# Patient Record
Sex: Female | Born: 1980 | Race: White | Hispanic: No | Marital: Married | Smoking: Former smoker
Health system: Southern US, Community
[De-identification: ages and names within clinical notes are randomized; demographics above are authoritative.]

## PROBLEM LIST (undated history)

## (undated) DIAGNOSIS — E119 Type 2 diabetes mellitus without complications: Secondary | ICD-10-CM

## (undated) DIAGNOSIS — F32A Depression, unspecified: Secondary | ICD-10-CM

## (undated) DIAGNOSIS — E785 Hyperlipidemia, unspecified: Secondary | ICD-10-CM

## (undated) DIAGNOSIS — F419 Anxiety disorder, unspecified: Secondary | ICD-10-CM

## (undated) DIAGNOSIS — F329 Major depressive disorder, single episode, unspecified: Secondary | ICD-10-CM

## (undated) DIAGNOSIS — D649 Anemia, unspecified: Secondary | ICD-10-CM

## (undated) HISTORY — DX: Anxiety disorder, unspecified: F41.9

## (undated) HISTORY — PX: INCISION AND DRAINAGE ABSCESS: SHX5864

## (undated) HISTORY — DX: Hyperlipidemia, unspecified: E78.5

## (undated) HISTORY — DX: Anemia, unspecified: D64.9

## (undated) HISTORY — DX: Major depressive disorder, single episode, unspecified: F32.9

## (undated) HISTORY — DX: Depression, unspecified: F32.A

---

## 2001-04-18 HISTORY — PX: TUBAL LIGATION: SHX77

## 2006-03-18 ENCOUNTER — Emergency Department: Payer: Self-pay | Admitting: Emergency Medicine

## 2007-05-06 DIAGNOSIS — E669 Obesity, unspecified: Secondary | ICD-10-CM | POA: Insufficient documentation

## 2007-05-06 DIAGNOSIS — Z6832 Body mass index (BMI) 32.0-32.9, adult: Secondary | ICD-10-CM | POA: Insufficient documentation

## 2007-05-06 DIAGNOSIS — F17200 Nicotine dependence, unspecified, uncomplicated: Secondary | ICD-10-CM | POA: Insufficient documentation

## 2007-05-06 DIAGNOSIS — N926 Irregular menstruation, unspecified: Secondary | ICD-10-CM | POA: Insufficient documentation

## 2007-06-11 ENCOUNTER — Ambulatory Visit: Payer: Self-pay | Admitting: Family Medicine

## 2007-07-23 DIAGNOSIS — F430 Acute stress reaction: Secondary | ICD-10-CM | POA: Insufficient documentation

## 2011-03-18 LAB — HM PAP SMEAR

## 2012-08-20 ENCOUNTER — Emergency Department: Payer: Self-pay | Admitting: Emergency Medicine

## 2013-02-13 LAB — LIPID PANEL
Cholesterol: 212 mg/dL — AB (ref 0–200)
HDL: 30 mg/dL — AB (ref 35–70)
LDL CALC: 150 mg/dL
TRIGLYCERIDES: 158 mg/dL (ref 40–160)

## 2013-02-13 LAB — HEPATIC FUNCTION PANEL
ALT: 14 U/L (ref 7–35)
AST: 9 U/L — AB (ref 13–35)

## 2013-02-13 LAB — BASIC METABOLIC PANEL
BUN: 6 mg/dL (ref 4–21)
Creatinine: 0.6 mg/dL (ref <=1.1)
GLUCOSE: 106 mg/dL
Potassium: 5.3 mmol/L (ref 3.4–5.3)
Sodium: 141 mmol/L (ref 137–147)

## 2013-09-13 ENCOUNTER — Ambulatory Visit: Payer: Self-pay | Admitting: Family Medicine

## 2014-08-29 LAB — HEMOGLOBIN A1C: Hgb A1c MFr Bld: 7.3 % — AB (ref 4.0–6.0)

## 2014-09-11 DIAGNOSIS — J309 Allergic rhinitis, unspecified: Secondary | ICD-10-CM | POA: Insufficient documentation

## 2014-09-11 DIAGNOSIS — F419 Anxiety disorder, unspecified: Secondary | ICD-10-CM | POA: Insufficient documentation

## 2014-09-11 DIAGNOSIS — D649 Anemia, unspecified: Secondary | ICD-10-CM | POA: Insufficient documentation

## 2014-09-11 DIAGNOSIS — L301 Dyshidrosis [pompholyx]: Secondary | ICD-10-CM | POA: Insufficient documentation

## 2014-09-11 DIAGNOSIS — E119 Type 2 diabetes mellitus without complications: Secondary | ICD-10-CM | POA: Insufficient documentation

## 2014-09-11 DIAGNOSIS — E7800 Pure hypercholesterolemia, unspecified: Secondary | ICD-10-CM | POA: Insufficient documentation

## 2014-09-11 DIAGNOSIS — F329 Major depressive disorder, single episode, unspecified: Secondary | ICD-10-CM | POA: Insufficient documentation

## 2014-09-11 DIAGNOSIS — A6000 Herpesviral infection of urogenital system, unspecified: Secondary | ICD-10-CM | POA: Insufficient documentation

## 2014-09-11 DIAGNOSIS — E1165 Type 2 diabetes mellitus with hyperglycemia: Secondary | ICD-10-CM | POA: Insufficient documentation

## 2014-09-11 DIAGNOSIS — O24419 Gestational diabetes mellitus in pregnancy, unspecified control: Secondary | ICD-10-CM | POA: Insufficient documentation

## 2014-09-11 DIAGNOSIS — F32A Depression, unspecified: Secondary | ICD-10-CM | POA: Insufficient documentation

## 2014-10-02 ENCOUNTER — Ambulatory Visit (INDEPENDENT_AMBULATORY_CARE_PROVIDER_SITE_OTHER): Payer: BLUE CROSS/BLUE SHIELD | Admitting: Family Medicine

## 2014-10-02 ENCOUNTER — Encounter: Payer: Self-pay | Admitting: Family Medicine

## 2014-10-02 VITALS — BP 110/76 | HR 80 | Temp 98.5°F | Resp 16 | Ht 63.0 in | Wt 172.0 lb

## 2014-10-02 DIAGNOSIS — F419 Anxiety disorder, unspecified: Secondary | ICD-10-CM

## 2014-10-02 DIAGNOSIS — E119 Type 2 diabetes mellitus without complications: Secondary | ICD-10-CM

## 2014-10-02 MED ORDER — ESCITALOPRAM OXALATE 10 MG PO TABS: 10.0000 mg | ORAL_TABLET | Freq: Every day | ORAL | Status: DC

## 2014-10-02 MED ORDER — METFORMIN HCL 1000 MG PO TABS: 1000.0000 mg | ORAL_TABLET | Freq: Two times a day (BID) | ORAL | Status: DC

## 2014-10-02 NOTE — Progress Notes (Signed)
Subjective:    Patient ID: Bethany Edwards, female    DOB: [birth year], 34 y.o.   MRN: [id number]  Diabetes She presents for her follow-up diabetic visit. She has type 2 diabetes mellitus. Her disease course has been stable. There are no hypoglycemic associated symptoms. Pertinent negatives for diabetes include no blurred vision, no chest pain, no fatigue, no foot paresthesias, no foot ulcerations, no polydipsia, no polyphagia, no polyuria, no visual change and no weakness. Symptoms are stable. There are no diabetic complications. Current diabetic treatment includes oral agent (monotherapy). She is compliant with treatment all of the time. Her home blood glucose trend is decreasing steadily. Her breakfast blood glucose is taken between 5-6 am. Her breakfast blood glucose range is generally <70 mg/dl.  Prior ov 08/29/14 Patient was restarted on Metformin HCl 1000 mg BID.  Depression: Patient complains of depression. She complains of depressed mood Onset was approximately a few months ago, gradually improving since that time.  She denies current suicidal and homicidal plan or intent.   Family history significant for no psychiatric illness.Possible organic causes contributing are: none.  Risk factors: none Previous treatment includes Cymbalta. She complains of the following side effects from the treatment: none. Prior ov 08/29/14 Patient was started on Escitalopram Oxalate 10 mg.    Review of Systems  Constitutional: Negative.  Negative for fatigue.  Eyes: Negative for blurred vision.  Respiratory: Negative.   Cardiovascular: Negative.  Negative for chest pain.  Endocrine: Negative for polydipsia, polyphagia and polyuria.  Neurological: Negative for weakness.  Psychiatric/Behavioral: Negative.     Patient Active Problem List   Diagnosis Date Noted  . Allergic rhinitis 09/11/2014  . Absolute anemia 09/11/2014  . Anxiety 09/11/2014  . Clinical depression 09/11/2014  . Diabetes 09/11/2014  .  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 Genital herpes 09/11/2014  . Diabetes mellitus arising in pregnancy 09/11/2014  . Hypercholesteremia 09/11/2014  . Acute stress disorder 07/23/2007  . Body mass index 32.0-32.9, adult 05/06/2007  . Irregular bleeding 05/06/2007  . Adiposity 05/06/2007  . Compulsive tobacco user syndrome 05/06/2007   Past Medical History  Diagnosis Date  . Depression   . Hyperlipidemia    Current Outpatient Prescriptions on File Prior to Visit  Medication Sig  . escitalopram (LEXAPRO) 10 MG tablet Take by mouth.  . metFORMIN (GLUCOPHAGE) 1000 MG tablet Take by mouth.  . pravastatin (PRAVACHOL) 20 MG tablet Take by mouth.   No current facility-administered medications on file prior to visit.   Allergies  Allergen Reactions  . Penicillins Rash    Can take Cephalosporin   Past Surgical History  Procedure Laterality Date  . Tubal ligation  2003  . Incision and drainage abscess Left 2003 and 2007    Axillary Abscess   History   Social History  . Marital Status: Married    Spouse Name: N/A  . Number of Children: N/A  . Years of Education: N/A   Occupational History  . Not on file.   Social History Main Topics  . Smoking status: Current Every Day Smoker -- 0.50 packs/day  . Smokeless tobacco: Never Used  . Alcohol Use: No  . Drug Use: No  . Sexual Activity: Not on file   Other Topics Concern  . Not on file   Social History Narrative   Family History  Problem Relation Age of Onset  . Adopted: Yes  . Family history unknown: Yes         Objective:   Physical Exam  Constitutional:  She appears well-developed and well-nourished.  Skin: Skin is warm and dry.  Psychiatric: She has a normal mood and affect. Her behavior is normal. Judgment and thought content normal.    Blood pressure 110/76, pulse 80, temperature 98.5 F (36.9 C), temperature source Oral, resp. rate 16, height 5\' 3"  (1.6 m), weight 172 lb (78.019 kg), last menstrual period  09/11/2014.       Assessment & Plan:    1. Type 2 diabetes mellitus without complication Improved.  Continue current medication and recheck in 3 months.  Is causing loose stools but tolerable. Has history of constipation.   2. Anxiety Greatly improved on medication. Mood level. No signs of bipolar disorder. Continue current medication. Please call back if condition worsens or does not continue to improve.

## 2014-11-13 ENCOUNTER — Encounter: Payer: Self-pay | Admitting: Family Medicine

## 2014-11-13 ENCOUNTER — Ambulatory Visit (INDEPENDENT_AMBULATORY_CARE_PROVIDER_SITE_OTHER): Payer: BLUE CROSS/BLUE SHIELD | Admitting: Family Medicine

## 2014-11-13 VITALS — BP 100/62 | HR 92 | Temp 98.4°F | Resp 16 | Wt 173.0 lb

## 2014-11-13 DIAGNOSIS — L0291 Cutaneous abscess, unspecified: Secondary | ICD-10-CM | POA: Insufficient documentation

## 2014-11-13 MED ORDER — AMOXICILLIN-POT CLAVULANATE 875-125 MG PO TABS: 1.0000 | ORAL_TABLET | Freq: Two times a day (BID) | ORAL | Status: DC

## 2014-11-13 NOTE — Progress Notes (Signed)
Subjective:    Patient ID: Bethany Edwards, female    DOB: Nov 01, 1980, 34 y.o.   MRN: [id number]  Rash This is a new problem. The current episode started in the past 7 days. The problem has been gradually worsening since onset. The affected locations include the right upper leg. The rash is characterized by pain, burning, swelling, itchiness and redness (warmth). She was exposed to an insect bite/sting. Associated symptoms include diarrhea (unrelated- pt would like to reduce Metformin), fatigue and a fever (up to 102.8). Pertinent negatives include no anorexia, congestion, cough, eye pain, facial edema, joint pain, nail changes, rhinorrhea, shortness of breath, sore throat or vomiting. Treatments tried: Cortisone cream and an old Doxy rx. The treatment provided no relief.      Review of Systems  Constitutional: Positive for fever (up to 102.8), chills and fatigue. Negative for diaphoresis, activity change, appetite change and unexpected weight change.  HENT: Negative for congestion, rhinorrhea and sore throat.   Eyes: Negative for pain.  Respiratory: Negative for cough and shortness of breath.   Gastrointestinal: Positive for diarrhea (unrelated- pt would like to reduce Metformin). Negative for vomiting and anorexia.  Musculoskeletal: Negative for joint pain.  Skin: Positive for rash. Negative for nail changes.   BP 100/62 mmHg  Pulse 92  Temp(Src) 98.4 F (36.9 C) (Oral)  Resp 16  Wt 173 lb (78.472 kg)  LMP 10/28/2014   Patient Active Problem List   Diagnosis Date Noted  . Allergic rhinitis 09/11/2014  . Absolute anemia 09/11/2014  . Anxiety 09/11/2014  . Diabetes 09/11/2014  . 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 Genital herpes 09/11/2014  . Hypercholesteremia 09/11/2014  . Body mass index 32.0-32.9, adult 05/06/2007  . Irregular bleeding 05/06/2007  . Adiposity 05/06/2007  . Compulsive tobacco user syndrome 05/06/2007   Past Medical History  Diagnosis Date  . Depression    . Hyperlipidemia    Current Outpatient Prescriptions on File Prior to Visit  Medication Sig  . escitalopram (LEXAPRO) 10 MG tablet Take 1 tablet (10 mg total) by mouth daily.  . metFORMIN (GLUCOPHAGE) 1000 MG tablet Take 1 tablet (1,000 mg total) by mouth 2 (two) times daily with a meal.  . pravastatin (PRAVACHOL) 20 MG tablet Take by mouth.   No current facility-administered medications on file prior to visit.   Allergies  Allergen Reactions  . Penicillins Rash    Can take Cephalosporin   Past Surgical History  Procedure Laterality Date  . Tubal ligation  2003  . Incision and drainage abscess Left 2003 and 2007    Axillary Abscess   History   Social History  . Marital Status: Married    Spouse Name: N/A  . Number of Children: N/A  . Years of Education: N/A   Occupational History  . Not on file.   Social History Main Topics  . Smoking status: Current Every Day Smoker -- 0.50 packs/day  . Smokeless tobacco: Never Used  . Alcohol Use: No  . Drug Use: No  . Sexual Activity: Not on file   Other Topics Concern  . Not on file   Social History Narrative   Family History  Problem Relation Age of Onset  . Adopted: Yes  . Family history unknown: Yes      Objective:   Physical Exam  Constitutional: She is oriented to person, place, and time. She appears well-developed and well-nourished.  Neurological: She is alert and oriented to person, place, and time.  Skin:  Palpable 2 cm  nodule with central scab and erythema.  Also, with palpable tender inguinal nodes.    Psychiatric: She has a normal mood and affect.   BP 100/62 mmHg  Pulse 92  Temp(Src) 98.4 F (36.9 C) (Oral)  Resp 16  Wt 173 lb (78.472 kg)  LMP 10/28/2014      Assessment & Plan:  1. Abscess Did not respond to Doxycycline.  Will start Augmentin. Call if worsens or does not improve.    - amoxicillin-clavulanate (AUGMENTIN) 875-125 MG per tablet; Take 1 tablet by mouth 2 (two) times daily.   Dispense: 20 tablet; Refill: 0  Lorie Phenix, MD

## 2014-11-19 ENCOUNTER — Telehealth: Payer: Self-pay | Admitting: Family Medicine

## 2014-11-19 NOTE — Telephone Encounter (Signed)
Pt states she was seen last Thursday for a bug bite.  Pt states she is still not feeling well and is still has a low grade fever.  Pt states she feels tired and dizzy.  Pt is asking is she needs lab work done or does she need to be seen again.  WG#[phone]/HQ

## 2014-11-19 NOTE — Telephone Encounter (Signed)
Can change to Doxycycline. Thanks.

## 2015-01-02 ENCOUNTER — Ambulatory Visit: Payer: BLUE CROSS/BLUE SHIELD | Admitting: Family Medicine

## 2015-01-05 ENCOUNTER — Ambulatory Visit (INDEPENDENT_AMBULATORY_CARE_PROVIDER_SITE_OTHER): Payer: BLUE CROSS/BLUE SHIELD | Admitting: Family Medicine

## 2015-01-05 ENCOUNTER — Encounter: Payer: Self-pay | Admitting: Family Medicine

## 2015-01-05 VITALS — BP 104/76 | HR 88 | Temp 98.5°F | Resp 16 | Ht 63.0 in | Wt 176.0 lb

## 2015-01-05 DIAGNOSIS — F419 Anxiety disorder, unspecified: Secondary | ICD-10-CM | POA: Diagnosis not present

## 2015-01-05 DIAGNOSIS — E7800 Pure hypercholesterolemia, unspecified: Secondary | ICD-10-CM

## 2015-01-05 DIAGNOSIS — E119 Type 2 diabetes mellitus without complications: Secondary | ICD-10-CM | POA: Diagnosis not present

## 2015-01-05 DIAGNOSIS — Z6831 Body mass index (BMI) 31.0-31.9, adult: Secondary | ICD-10-CM | POA: Diagnosis not present

## 2015-01-05 LAB — POCT GLYCOSYLATED HEMOGLOBIN (HGB A1C): HEMOGLOBIN A1C: 7

## 2015-01-05 MED ORDER — PHENTERMINE HCL 37.5 MG PO CAPS: 37.5000 mg | ORAL_CAPSULE | ORAL | Status: DC

## 2015-01-05 MED ORDER — METFORMIN HCL ER (OSM) 1000 MG PO TB24: 1000.0000 mg | ORAL_TABLET | Freq: Every day | ORAL | Status: DC

## 2015-01-05 NOTE — Progress Notes (Signed)
Patient ID: Bethany Edwards, female   DOB: [birth year], 34 y.o.   MRN: [id number]         Patient: Bethany Edwards Female    DOB: [birth year]   34 y.o.   MRN: [id number] Visit Date: 01/05/2015  Today's Provider: Lorie Phenix, MD   Chief Complaint  Patient presents with  . Diabetes  . Anxiety  . Hyperlipidemia   Subjective:    Diabetes She presents for her follow-up diabetic visit. She has type 2 diabetes mellitus. Her disease course has been stable. Hypoglycemia symptoms include nervousness/anxiousness. Pertinent negatives for hypoglycemia include no confusion, dizziness, headaches or tremors. Associated symptoms include polydipsia and polyuria. Pertinent negatives for diabetes include no blurred vision, no chest pain, no foot paresthesias, no foot ulcerations, no polyphagia, no visual change, no weakness and no weight loss. Symptoms are stable. Pertinent negatives for diabetic complications include no impotence. Current diabetic treatment includes oral agent (monotherapy). She is compliant with treatment all of the time. Her weight is stable. She is following a diabetic diet. There is no change in her home blood glucose trend. Her overall blood glucose range is 130-140 mg/dl. She does not see a podiatrist.Eye exam is not current.  Anxiety Presents for follow-up visit. Symptoms include nervous/anxious behavior. Patient reports no chest pain, confusion, decreased concentration, dizziness, impotence, insomnia, malaise, muscle tension, nausea, obsessions, palpitations, panic, restlessness or suicidal ideas. The quality of sleep is good. Nighttime awakenings: none.   Past treatments include SSRIs. The treatment provided moderate relief. Compliance with prior treatments has been good.    Pt reports a having a skin tag on the left side of her head (Near her hairline).  She says it has been there for about six months and is growing in size.  She says it gets caught in her hair brush, and  sunglasses.    Pt would like to try Phentermine for a short time.  She would like to try and "Just start" her diet.  She has used it in the past with success. Planning to get married next year. Has been on it before and it helped.  Knows it is just short term. Starting classes and working and really busy. Does not eat poorly, but needs a jolt. Is how it works.     Allergies  Allergen Reactions  . Penicillins Rash    Can take Cephalosporin   Previous Medications   ESCITALOPRAM (LEXAPRO) 10 MG TABLET    Take 1 tablet (10 mg total) by mouth daily.   PRAVASTATIN (PRAVACHOL) 20 MG TABLET    Take by mouth.    Review of Systems  Constitutional: Negative for chills, weight loss, diaphoresis, activity change, appetite change and unexpected weight change.  Eyes: Negative for blurred vision.  Respiratory: Positive for wheezing. Negative for apnea, choking, chest tightness and stridor.        Pt currently smokes.   Cardiovascular: Negative for chest pain and palpitations.  Gastrointestinal: Negative for nausea, abdominal pain, constipation, blood in stool, abdominal distention, anal bleeding and rectal pain.  Endocrine: Positive for polydipsia and polyuria. Negative for cold intolerance, heat intolerance and polyphagia.  Genitourinary: Negative for impotence.  Musculoskeletal: Negative.   Skin:       Skin tag on left side of her face.    Neurological: Negative for dizziness, tremors, weakness, light-headedness, numbness and headaches.  Psychiatric/Behavioral: Negative for suicidal ideas, hallucinations, behavioral problems, confusion, sleep disturbance, self-injury, dysphoric mood, decreased concentration and agitation. The patient is nervous/anxious. The patient  does not have insomnia and is not hyperactive.     Social History  Substance Use Topics  . Smoking status: Current Every Day Smoker -- 0.50 packs/day  . Smokeless tobacco: Never Used  . Alcohol Use: No   Objective:   BP 104/76  mmHg  Pulse 88  Temp(Src) 98.5 F (36.9 C) (Oral)  Resp 16  Ht  (1.6 m)  Wt 176 lb (79.833 kg)  BMI 31.18 kg/m2  Physical Exam  Constitutional: She is oriented to person, place, and time. She appears well-developed and well-nourished.  Cardiovascular: Normal rate and regular rhythm.   Pulmonary/Chest: Effort normal and breath sounds normal.  Neurological: She is alert and oriented to person, place, and time.  Skin: Skin is warm and dry.  Skin tag on temple.          Assessment & Plan:     1. Type 2 diabetes mellitus without complication Improved, but having trouble with Metformin. Will try extended release and see if improves. Also want to add some Phentermine. Has tolerated it well in the past. EKG normal BP ok.  - POCT glycosylated hemoglobin (Hb A1C) - EKG 12-Lead  2. Hypercholesteremia Condition is stable. Please continue current medication and  plan of care as noted.    3. Anxiety Condition is stable. Please continue current medication and  plan of care as noted.    4. BMI 31.0-31.9,adult Worsening. Would like to restart Phentermine. Understands risks of increased blood pressure and anxiety. Will stop if any side effects. Has tolerated previously.   - phentermine 37.5 MG capsule; Take 1 capsule (37.5 mg total) by mouth every morning.  Dispense: 30 capsule; Refill: 0      Lorie Phenix, MD  [facility]

## 2015-01-19 ENCOUNTER — Telehealth: Payer: Self-pay | Admitting: Family Medicine

## 2015-01-19 DIAGNOSIS — E119 Type 2 diabetes mellitus without complications: Secondary | ICD-10-CM

## 2015-01-19 MED ORDER — METFORMIN HCL 1000 MG PO TABS: 1000.0000 mg | ORAL_TABLET | Freq: Two times a day (BID) | ORAL | Status: DC

## 2015-01-19 NOTE — Telephone Encounter (Signed)
Pt called wanting to know if you would change her metformin back to the regular.  She said the extended was not keeping her sugar down.  Call back [phone]  [facility]

## 2015-01-20 ENCOUNTER — Telehealth: Payer: Self-pay | Admitting: Family Medicine

## 2015-01-20 MED ORDER — GLIPIZIDE ER 2.5 MG PO TB24: 2.5000 mg | ORAL_TABLET | Freq: Every day | ORAL | Status: DC

## 2015-01-20 MED ORDER — METFORMIN HCL 500 MG PO TABS: 500.0000 mg | ORAL_TABLET | Freq: Two times a day (BID) | ORAL | Status: DC

## 2015-01-20 NOTE — Telephone Encounter (Signed)
Pt advised.   Thanks,   -Gabrielle Wakeland  

## 2015-01-20 NOTE — Telephone Encounter (Signed)
Stomach not doing well with the metformin ER.  Please advise.  [phone]  thanksTer

## 2015-01-20 NOTE — Telephone Encounter (Signed)
Sent Metformin at 500 bid with glipizide low dose, 2.5. Please notify patient. Thanks.

## 2015-01-20 NOTE — Telephone Encounter (Signed)
Pt says she has bad side effects when she takes the regular release Metformin 1,000mg  twice a day.  She reports she tolerated the  twice a day okay.  She was wondering if you could drop it back to  twice a day and maybe add another medication to help lower he blood sugar.  [facility] [address]  Thanks,   -Vernona Rieger

## 2015-02-03 ENCOUNTER — Ambulatory Visit: Payer: BLUE CROSS/BLUE SHIELD | Admitting: Family Medicine

## 2015-02-10 ENCOUNTER — Ambulatory Visit (INDEPENDENT_AMBULATORY_CARE_PROVIDER_SITE_OTHER): Payer: BLUE CROSS/BLUE SHIELD | Admitting: Family Medicine

## 2015-02-10 ENCOUNTER — Encounter: Payer: Self-pay | Admitting: Family Medicine

## 2015-02-10 VITALS — BP 100/64 | HR 96 | Temp 98.4°F | Resp 16 | Wt 176.0 lb

## 2015-02-10 DIAGNOSIS — L918 Other hypertrophic disorders of the skin: Secondary | ICD-10-CM

## 2015-02-10 DIAGNOSIS — E669 Obesity, unspecified: Secondary | ICD-10-CM

## 2015-02-10 DIAGNOSIS — E119 Type 2 diabetes mellitus without complications: Secondary | ICD-10-CM

## 2015-02-10 NOTE — Progress Notes (Signed)
Subjective:    Patient ID: Bethany Edwards, female    DOB: 1980/10/15, 34 y.o.   MRN: [id number]  Diabetes She presents for her follow-up diabetic visit. She has type 2 diabetes mellitus. Her disease course has been improving (Last A1C was 01/05/2015 and was 7.0%). Hypoglycemia symptoms include hunger. Pertinent negatives for diabetes include no blurred vision, no chest pain, no fatigue, no foot paresthesias, no foot ulcerations, no polydipsia, no polyphagia, no polyuria, no visual change, no weakness and no weight loss. There are no hypoglycemic complications. Symptoms are improving. There are no diabetic complications. Current diabetic treatment includes oral agent (dual therapy) (Metformin 500 mg BID and Glipizide 2.5 mg). She is compliant with treatment all of the time. Her weight is stable. She never participates in exercise. Her home blood glucose trend is decreasing steadily. Her breakfast blood glucose range is generally 90-110 mg/dl. An ACE inhibitor/angiotensin II receptor blocker is not being taken. Eye exam is not current.   SUBJECTIVE:  Bethany Edwards is a 34 y.o. female who would like a skin check. There is no personal history of skin cancer. There is an unknown family history of skin cancer due to adoption. Has several skin tags that get in the way. Several large lesions on her right neck line and one on her left temple.  Review of Systems  Constitutional: Negative for weight loss and fatigue.  Eyes: Negative for blurred vision.  Cardiovascular: Negative for chest pain.  Endocrine: Negative for polydipsia, polyphagia and polyuria.  Neurological: Negative for weakness.   BP 100/64 mmHg  Pulse 96  Temp(Src) 98.4 F (36.9 C) (Oral)  Resp 16  Wt 176 lb (79.833 kg)  LMP 01/24/2015   Patient Active Problem List   Diagnosis Date Noted  . Abscess 11/13/2014  . Allergic rhinitis 09/11/2014  . Absolute anemia 09/11/2014  . Anxiety 09/11/2014  . Diabetes (HCC) 09/11/2014    . 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 Genital herpes 09/11/2014  . Hypercholesteremia 09/11/2014  . Irregular bleeding 05/06/2007  . Adiposity 05/06/2007  . Compulsive tobacco user syndrome 05/06/2007   Past Medical History  Diagnosis Date  . Depression   . Hyperlipidemia    Current Outpatient Prescriptions on File Prior to Visit  Medication Sig  . escitalopram (LEXAPRO) 10 MG tablet Take 1 tablet (10 mg total) by mouth daily.  Marland Kitchen glipiZIDE (GLUCOTROL XL) 2.5 MG 24 hr tablet Take 1 tablet (2.5 mg total) by mouth daily with breakfast.  . phentermine 37.5 MG capsule Take 1 capsule (37.5 mg total) by mouth every morning. (Patient not taking: Reported on 02/10/2015)  . pravastatin (PRAVACHOL) 20 MG tablet Take by mouth.   No current facility-administered medications on file prior to visit.   Allergies  Allergen Reactions  . Penicillins Rash    Can take Cephalosporin   Past Surgical History  Procedure Laterality Date  . Tubal ligation  2003  . Incision and drainage abscess Left 2003 and 2007    Axillary Abscess   Social History   Social History  . Marital Status: Married    Spouse Name: N/A  . Number of Children: N/A  . Years of Education: N/A   Occupational History  . Not on file.   Social History Main Topics  . Smoking status: Current Every Day Smoker -- 0.50 packs/day  . Smokeless tobacco: Never Used  . Alcohol Use: No  . Drug Use: No  . Sexual Activity: Not on file   Other Topics Concern  . Not  on file   Social History Narrative   Family History  Problem Relation Age of Onset  . Adopted: Yes  . Family history unknown: Yes      Objective:   Physical Exam  Constitutional: She is oriented to person, place, and time. She appears well-developed and well-nourished.  Cardiovascular: Normal rate and regular rhythm.   Neurological: She is alert and oriented to person, place, and time.  Skin: Skin is warm and dry.  Skin tags on right neck, pedunculated,  hyperpigmented over pedicle. No abnormal skin changes. No erythema.  No irregular border.  Also, lesion on left temple, skin colored, smaller.  All treated with cryopen. Patient tolerated procedure well.     BP 100/64 mmHg  Pulse 96  Temp(Src) 98.4 F (36.9 C) (Oral)  Resp 16  Wt 176 lb (79.833 kg)  LMP 01/24/2015     Assessment & Plan:  1. Cutaneous skin tags Treated with cryopen today. Will retreat if not resolved.   2. Adiposity Going to take Phentermine.  She has not  Filled it yet.  Will monitor BP and call if any side effects.   3. Type 2 diabetes mellitus without complication, unspecified long term insulin use status (HCC) Improved on Metformin. Will continue medication.   Recheck in 2 months.   Lorie PhenixNancy Mechele Kittleson, MD

## 2015-03-11 ENCOUNTER — Ambulatory Visit: Payer: BLUE CROSS/BLUE SHIELD | Admitting: Family Medicine

## 2015-06-19 ENCOUNTER — Telehealth: Payer: Self-pay | Admitting: Family Medicine

## 2015-06-19 MED ORDER — GLIPIZIDE ER 2.5 MG PO TB24: 2.5000 mg | ORAL_TABLET | Freq: Every day | ORAL | Status: DC

## 2015-06-19 NOTE — Telephone Encounter (Signed)
[facility] not [facility] this medication-national back order for them, they are not sure when they will get it at this dose. I called [facility] and they do have this medication in stock, if patient ok with us sending it there or she can call other pharmacy to see if its cheaper somewhere else. [id number]

## 2015-06-19 NOTE — Telephone Encounter (Signed)
[facility] to let Dr. Elease HashimotoMaloney know that she took her last dose of glipiZIDE (GLUCOTROL XL) 2.5 MG 24 hr tablet  Last Tuesday or Wednesday 2/21 or 06/10/15 b/c all [facility] been out of that dosage. [facility] Dr[facility] Elease HashimotoMaloney wanted them to substitute for anything else. Please advise. Thanks TNP

## 2015-06-19 NOTE — Telephone Encounter (Signed)
Please call pharmacy and check on this. Thanks.

## 2015-06-19 NOTE — Telephone Encounter (Signed)
Spoke with patient and she is fine to go to [facility] and she will get this filled there this time.-aa

## 2015-07-01 ENCOUNTER — Other Ambulatory Visit: Payer: Self-pay | Admitting: Family Medicine

## 2015-07-01 DIAGNOSIS — E119 Type 2 diabetes mellitus without complications: Secondary | ICD-10-CM

## 2015-07-01 NOTE — Telephone Encounter (Signed)
Last A1C 01/05/2015. Last OV was cancelled and not rescheduled. Needs OV. Allene DillonEmily Drozdowski, CMA

## 2015-08-03 ENCOUNTER — Other Ambulatory Visit: Payer: Self-pay | Admitting: Family Medicine

## 2015-08-03 DIAGNOSIS — E119 Type 2 diabetes mellitus without complications: Secondary | ICD-10-CM

## 2015-09-07 ENCOUNTER — Encounter: Payer: Self-pay | Admitting: Family Medicine

## 2015-09-07 ENCOUNTER — Ambulatory Visit (INDEPENDENT_AMBULATORY_CARE_PROVIDER_SITE_OTHER): Payer: BLUE CROSS/BLUE SHIELD | Admitting: Family Medicine

## 2015-09-07 ENCOUNTER — Ambulatory Visit: Payer: BLUE CROSS/BLUE SHIELD | Admitting: Family Medicine

## 2015-09-07 VITALS — BP 100/60 | HR 110 | Temp 98.9°F | Resp 16 | Wt 179.0 lb

## 2015-09-07 DIAGNOSIS — R05 Cough: Secondary | ICD-10-CM

## 2015-09-07 DIAGNOSIS — F17200 Nicotine dependence, unspecified, uncomplicated: Secondary | ICD-10-CM | POA: Diagnosis not present

## 2015-09-07 DIAGNOSIS — R059 Cough, unspecified: Secondary | ICD-10-CM

## 2015-09-07 DIAGNOSIS — J018 Other acute sinusitis: Secondary | ICD-10-CM

## 2015-09-07 MED ORDER — ALBUTEROL SULFATE HFA 108 (90 BASE) MCG/ACT IN AERS: 2.0000 | INHALATION_SPRAY | Freq: Four times a day (QID) | RESPIRATORY_TRACT | Status: DC | PRN

## 2015-09-07 MED ORDER — BUPROPION HCL ER (SR) 150 MG PO TB12: 100.0000 mg | ORAL_TABLET | Freq: Two times a day (BID) | ORAL | Status: DC

## 2015-09-07 MED ORDER — AMOXICILLIN-POT CLAVULANATE 875-125 MG PO TABS: 1.0000 | ORAL_TABLET | Freq: Two times a day (BID) | ORAL | Status: DC

## 2015-09-07 NOTE — Progress Notes (Signed)
Patient ID: Bethany Edwards, female   DOB: 09/05/1980, 35 y.o.   MRN: [id number]       Patient: Bethany Edwards Female    DOB: 08/25/1980   35 y.o.   MRN: [id number] Visit Date: 09/07/2015  Today's Provider: Lorie PhenixNancy Mikaili Flippin, MD   Chief Complaint  Patient presents with  . URI   Subjective:    URI  This is a new problem. Episode onset: saturday. The problem has been gradually worsening. The maximum temperature recorded prior to her arrival was 102 - 102.9 F. The fever has been present for 1 to 2 days. Associated symptoms include congestion, coughing, headaches, a plugged ear sensation, rhinorrhea, sinus pain, sneezing, a sore throat and wheezing. She has tried acetaminophen and decongestant for the symptoms. The treatment provided mild relief.   Still smoking.      Allergies  Allergen Reactions  . Penicillins Rash    Can take Cephalosporin   Previous Medications   GLIPIZIDE (GLUCOTROL XL) 2.5 MG 24 HR TABLET    Take 1 tablet (2.5 mg total) by mouth daily with breakfast.   METFORMIN (GLUCOPHAGE) 500 MG TABLET    TAKE 1 TABLET (500 MG TOTAL) BY MOUTH 2 (TWO) TIMES DAILY WITH A MEAL.    Review of Systems  HENT: Positive for congestion, rhinorrhea, sneezing and sore throat.   Respiratory: Positive for cough and wheezing.   Neurological: Positive for headaches.    Social History  Substance Use Topics  . Smoking status: Current Every Day Smoker -- 0.50 packs/day  . Smokeless tobacco: Never Used  . Alcohol Use: No   Objective:   BP 100/60 mmHg  Pulse 110  Temp(Src) 98.9 F (37.2 C) (Oral)  Resp 16  Wt 179 lb (81.194 kg)  SpO2 95%  Physical Exam  Constitutional: She is oriented to person, place, and time. She appears well-developed and well-nourished.  HENT:  Head: Normocephalic and atraumatic.  Right Ear: External ear normal.  Left Ear: External ear normal.  Nose: Mucosal edema and rhinorrhea present.  Mouth/Throat: Oropharynx is clear and moist.  Cardiovascular:  Normal rate and regular rhythm.   Pulmonary/Chest: She has wheezes.  scattered inspiratory wheezing  Neurological: She is alert and oriented to person, place, and time.  Psychiatric: She has a normal mood and affect. Her behavior is normal. Judgment and thought content normal.       Assessment & Plan:     1. Cough New problem. Patient started on augmentin 875-125 mg and albuterol inhaler as below. - amoxicillin-clavulanate (AUGMENTIN) 875-125 MG tablet; Take 1 tablet by mouth 2 (two) times daily.  Dispense: 20 tablet; Refill: 0 - albuterol (PROVENTIL HFA;VENTOLIN HFA) 108 (90 Base) MCG/ACT inhaler; Inhale 2 puffs into the lungs every 6 (six) hours as needed for wheezing or shortness of breath.  Dispense: 1 Inhaler; Refill: 0  2. Other acute sinusitis New problem. As above. - albuterol (PROVENTIL HFA;VENTOLIN HFA) 108 (90 Base) MCG/ACT inhaler; Inhale 2 puffs into the lungs every 6 (six) hours as needed for wheezing or shortness of breath.  Dispense: 1 Inhaler; Refill: 0  3. Compulsive tobacco user syndrome Patient started on bupropion SR 150 mg 1 tablet for 3 days then 1 tablet two times daily. - buPROPion (WELLBUTRIN SR) 150 MG 12 hr tablet; Take 1 tablet (150 mg total) by mouth 2 (two) times daily.  Dispense: 30 tablet; Refill: 3    Patient seen and examined by Dr. Leo GrosserNancy J.. Francia Verry, and note scribed by Liz BeachSulibeya S. Dimas,  CMA.  I have reviewed the document for accuracy and completeness and I agree with above. - Leo Grosser, MD   Lorie Phenix, MD  [facility]

## 2015-09-09 ENCOUNTER — Ambulatory Visit
Admission: RE | Admit: 2015-09-09 | Discharge: 2015-09-09 | Disposition: A | Discharge status: Home / Self Care | Payer: BLUE CROSS/BLUE SHIELD | Source: Ambulatory Visit | Attending: Family Medicine | Admitting: Family Medicine

## 2015-09-09 ENCOUNTER — Encounter: Payer: Self-pay | Admitting: Family Medicine

## 2015-09-09 ENCOUNTER — Ambulatory Visit (INDEPENDENT_AMBULATORY_CARE_PROVIDER_SITE_OTHER): Payer: BLUE CROSS/BLUE SHIELD | Admitting: Family Medicine

## 2015-09-09 VITALS — BP 102/74 | HR 92 | Temp 98.8°F | Resp 16 | Wt 178.0 lb

## 2015-09-09 DIAGNOSIS — J209 Acute bronchitis, unspecified: Secondary | ICD-10-CM

## 2015-09-09 DIAGNOSIS — L0291 Cutaneous abscess, unspecified: Secondary | ICD-10-CM

## 2015-09-09 DIAGNOSIS — R0602 Shortness of breath: Secondary | ICD-10-CM | POA: Diagnosis not present

## 2015-09-09 DIAGNOSIS — R05 Cough: Secondary | ICD-10-CM | POA: Diagnosis not present

## 2015-09-09 DIAGNOSIS — E119 Type 2 diabetes mellitus without complications: Secondary | ICD-10-CM

## 2015-09-09 DIAGNOSIS — E7800 Pure hypercholesterolemia, unspecified: Secondary | ICD-10-CM

## 2015-09-09 LAB — POCT GLYCOSYLATED HEMOGLOBIN (HGB A1C): Hemoglobin A1C: 7.7

## 2015-09-09 MED ORDER — GLIPIZIDE ER 5 MG PO TB24: 5.0000 mg | ORAL_TABLET | Freq: Every day | ORAL | Status: DC

## 2015-09-09 MED ORDER — DOXYCYCLINE HYCLATE 100 MG PO TABS: 100.0000 mg | ORAL_TABLET | Freq: Two times a day (BID) | ORAL | Status: DC

## 2015-09-09 MED ORDER — PREDNISONE 10 MG PO TABS: ORAL_TABLET | ORAL | Status: DC

## 2015-09-09 NOTE — Progress Notes (Signed)
Patient ID: Bethany Edwards, female   DOB: 05/24/1980, 35 y.o.   MRN: [id number]         Patient: Bethany Edwards Female    DOB: [birth year]   35 y.o.   MRN: [id number] Visit Date: 09/09/2015  Today's Provider: Lorie Phenix, MD   Chief Complaint  Patient presents with  . Diabetes   Subjective:    HPI   Diabetes Mellitus Type II, Follow-up:   Lab Results  Component Value Date   HGBA1C 7.7 09/09/2015   HGBA1C 7.0 01/05/2015   HGBA1C 7.3* 08/29/2014   Last seen for diabetes 8 months ago.  Management since then includes None. She reports excellent compliance with treatment. She is not having side effects.  Current symptoms include polydipsia and have been stable. Home blood sugar records: Low to mid 100's  Episodes of hypoglycemia? no   Most Recent Eye Exam: UTD Weight trend: stable Prior visit with dietician: no Current diet: in general, a "healthy" diet   Current exercise: walking  ------------------------------------------------------------------------    Lipid/Cholesterol, Follow-up:   Last seen for this 8 months ago.  Management since that visit includes None.  Last Lipid Panel:    Component Value Date/Time   CHOL 212* 02/13/2013   TRIG 158 02/13/2013   HDL 30* 02/13/2013   LDLCALC 150 02/13/2013      Wt Readings from Last 3 Encounters:  09/09/15 178 lb (80.74 kg)  09/07/15 179 lb (81.194 kg)  02/10/15 176 lb (79.833 kg)    ------------------------------------------------------------------------ Acute Bronchitis: Patient presents for presents evaluation of right ear pain, nasal congestion, productive cough and wheezing. Symptoms began 4 days ago and are gradually improving since that time. Pt has been taking Augmentin and Albuterol inhaler.   Abscess: Patient presents for evaluation of a cutaneous abscess. Lesion is located in the vaginal. Onset was several days ago. Symptoms have about the same. Abscess has associated symptoms of pain.  Patient does have previous history of cutaneous abscesses. Patient does have diabetes.       Allergies  Allergen Reactions  . Penicillins Rash    Can take Cephalosporin   Previous Medications   ALBUTEROL (PROVENTIL HFA;VENTOLIN HFA) 108 (90 BASE) MCG/ACT INHALER    Inhale 2 puffs into the lungs every 6 (six) hours as needed for wheezing or shortness of breath.   AMOXICILLIN-CLAVULANATE (AUGMENTIN) 875-125 MG TABLET    Take 1 tablet by mouth 2 (two) times daily.   BUPROPION (WELLBUTRIN SR) 150 MG 12 HR TABLET    Take 1 tablet (150 mg total) by mouth 2 (two) times daily.   GLIPIZIDE (GLUCOTROL XL) 2.5 MG 24 HR TABLET    Take 1 tablet (2.5 mg total) by mouth daily with breakfast.   METFORMIN (GLUCOPHAGE) 500 MG TABLET    TAKE 1 TABLET (500 MG TOTAL) BY MOUTH 2 (TWO) TIMES DAILY WITH A MEAL.    Review of Systems  Constitutional: Negative for diaphoresis, activity change, appetite change and unexpected weight change.  HENT: Positive for congestion, sinus pressure and voice change. Negative for ear discharge, nosebleeds, sneezing, tinnitus and trouble swallowing.   Eyes: Negative.   Respiratory: Positive for chest tightness. Negative for apnea, choking and stridor.   Cardiovascular: Negative.   Gastrointestinal: Positive for constipation (Chronic issue). Negative for nausea, vomiting, abdominal pain, diarrhea, blood in stool, abdominal distention, anal bleeding and rectal pain.  Endocrine: Negative.   Musculoskeletal: Negative.   Neurological: Positive for light-headedness. Negative for numbness.    Social History  Substance Use Topics  . Smoking status: Current Every Day Smoker -- 0.50 packs/day  . Smokeless tobacco: Never Used  . Alcohol Use: No   Objective:   BP 102/74 mmHg  Pulse 92  Temp(Src) 98.8 F (37.1 C) (Oral)  Resp 16  Wt 178 lb (80.74 kg)  SpO2 96%  LMP 09/05/2015 (Exact Date)  Physical Exam  Constitutional: She appears well-developed and well-nourished.    Cardiovascular: Normal rate, regular rhythm, normal heart sounds and intact distal pulses.   Pulmonary/Chest: Effort normal and breath sounds normal.  Genitourinary:  1 cm by 1/2 cm abscess noted.    Skin: Skin is warm and dry.  Psychiatric: She has a normal mood and affect. Her behavior is normal. Judgment and thought content normal.        Assessment & Plan:     1. Type 2 diabetes mellitus without complication, unspecified long term insulin use status (HCC) A1C elevated at 7.7%;  Pt does not tolerate increased dose of Metformin.  Will increase Glipizide to 5mg  a day and she will work on lifestyle changes.  Recheck in three months with Antony ContrasJenni.  - CBC with Differential/Platelet - Lipid panel - TSH - Comprehensive metabolic panel - POCT glycosylated hemoglobin (Hb A1C) - glipiZIDE (GLIPIZIDE XL) 5 MG 24 hr tablet; Take 1 tablet (5 mg total) by mouth daily with breakfast.  Dispense: 90 tablet; Refill: 1  2. Hypercholesteremia Slightly elevated at last check will recheck today.   - CBC with Differential/Platelet - Lipid panel - TSH - Comprehensive metabolic panel  3. Abscess Will treat as below; advised pt to call if not improved or worsening.    - doxycycline (VIBRA-TABS) 100 MG tablet; Take 1 tablet (100 mg total) by mouth 2 (two) times daily.  Dispense: 14 tablet; Refill: 0  4. Acute bronchitis, unspecified organism Will change antibiotic from Augmentin to Doxycycline; Start Prednisone as below.    - DG Chest 2 View - predniSONE (DELTASONE) 10 MG tablet; 6 tabs for 1 day; 5 tabs for 1 day; 4 tabs for 1 day; 3 tabs for 1 day; 2 tabs for 1 day; 1 tab for 1 day.  Dispense: 21 tablet; Refill: 0   Patient was seen and examined by Leo GrosserNancy J. Ellinore Merced, MD, and note scribed by Kavin LeechLaura Walsh, CMA.   I have reviewed the document for accuracy and completeness and I agree with above. - Leo GrosserNancy J. Jaliel Deavers, MD      Lorie PhenixNancy Wilmer Santillo, MD  [facility]

## 2015-09-10 ENCOUNTER — Telehealth: Payer: Self-pay

## 2015-09-10 LAB — CBC WITH DIFFERENTIAL/PLATELET
BASOS ABS: 0 10*3/uL (ref 0.0–0.2)
BASOS: 0 %
EOS (ABSOLUTE): 0.2 10*3/uL (ref 0.0–0.4)
EOS: 2 %
HEMATOCRIT: 40.2 % (ref 34.0–46.6)
HEMOGLOBIN: 13.2 g/dL (ref 11.1–15.9)
Immature Grans (Abs): 0 10*3/uL (ref 0.0–0.1)
Immature Granulocytes: 0 %
LYMPHS ABS: 2.4 10*3/uL (ref 0.7–3.1)
Lymphs: 28 %
MCH: 29.8 pg (ref 26.6–33.0)
MCHC: 32.8 g/dL (ref 31.5–35.7)
MCV: 91 fL (ref 79–97)
MONOCYTES: 6 %
Monocytes Absolute: 0.5 10*3/uL (ref 0.1–0.9)
NEUTROS ABS: 5.5 10*3/uL (ref 1.4–7.0)
Neutrophils: 64 %
Platelets: 283 10*3/uL (ref 150–379)
RBC: 4.43 x10E6/uL (ref 3.77–5.28)
RDW: 14.2 % (ref 12.3–15.4)
WBC: 8.5 10*3/uL (ref 3.4–10.8)

## 2015-09-10 LAB — COMPREHENSIVE METABOLIC PANEL
ALK PHOS: 76 IU/L (ref 39–117)
ALT: 45 IU/L — ABNORMAL HIGH (ref 0–32)
AST: 21 IU/L (ref 0–40)
Albumin/Globulin Ratio: 1.9 (ref 1.2–2.2)
Albumin: 4.5 g/dL (ref 3.5–5.5)
BILIRUBIN TOTAL: 0.4 mg/dL (ref 0.0–1.2)
BUN/Creatinine Ratio: 11 (ref 9–23)
BUN: 8 mg/dL (ref 6–20)
CHLORIDE: 99 mmol/L (ref 96–106)
CO2: 24 mmol/L (ref 18–29)
CREATININE: 0.7 mg/dL (ref 0.57–1.00)
Calcium: 9.4 mg/dL (ref 8.7–10.2)
GFR calc Af Amer: 128 mL/min/{1.73_m2} (ref >59)
GFR calc non Af Amer: 111 mL/min/{1.73_m2} (ref >59)
GLUCOSE: 133 mg/dL — AB (ref 65–99)
Globulin, Total: 2.4 g/dL (ref 1.5–4.5)
Potassium: 4.8 mmol/L (ref 3.5–5.2)
Sodium: 139 mmol/L (ref 134–144)
Total Protein: 6.9 g/dL (ref 6.0–8.5)

## 2015-09-10 LAB — LIPID PANEL
CHOL/HDL RATIO: 7.8 ratio — AB (ref 0.0–4.4)
Cholesterol, Total: 204 mg/dL — ABNORMAL HIGH (ref 100–199)
HDL: 26 mg/dL — ABNORMAL LOW (ref >39)
LDL CALC: 141 mg/dL — AB (ref 0–99)
Triglycerides: 187 mg/dL — ABNORMAL HIGH (ref 0–149)
VLDL Cholesterol Cal: 37 mg/dL (ref 5–40)

## 2015-09-10 LAB — TSH: TSH: 1.54 u[IU]/mL (ref 0.450–4.500)

## 2015-09-10 MED ORDER — ATORVASTATIN CALCIUM 10 MG PO TABS: 10.0000 mg | ORAL_TABLET | Freq: Every day | ORAL | Status: DC

## 2015-09-10 NOTE — Telephone Encounter (Signed)
Advised patient as below.  

## 2015-09-10 NOTE — Addendum Note (Signed)
Addended by: Leo GrosserMALONEY, Adreana J on: 09/10/2015 01:59 PM   Modules accepted: Orders

## 2015-09-10 NOTE — Telephone Encounter (Signed)
-----   Message from Lorie PhenixNancy Maloney, MD sent at 09/09/2015  2:28 PM EDT ----- CXR is normal. Thanks.

## 2015-09-10 NOTE — Telephone Encounter (Signed)
-----   Message from Lorie PhenixNancy Maloney, MD sent at 09/10/2015 11:41 AM EDT ----- Labs stable except for a few abnormalities. Liver enzyme very slightly off. Recommend recheck in 4 weeks. Also, cholesterol not at goal. Low good cholesterol and high bad cholesterol,  (26 and 141) and statin is recommended.    Please see if patient would like to start a medication. Will have to follow liver enzymes very closely.  Thanks.

## 2015-09-10 NOTE — Telephone Encounter (Signed)
Advised pt of lab results. Pt verbally acknowledges understanding. Pt does agree to start medication. [facility] [address] [address]. Allene DillonEmily Drozdowski, CMA

## 2015-09-10 NOTE — Telephone Encounter (Signed)
Left message to call back  

## 2015-09-24 ENCOUNTER — Telehealth: Payer: Self-pay | Admitting: Family Medicine

## 2015-09-24 NOTE — Telephone Encounter (Signed)
Pt calleds aying she would like a copy of her visit in May 22nd and the 24th. She needs this to turn in to insurance.  She would like to pick copies of visits tomorrow.  Her call back number [phone]  Thanks Barth Kirkseri

## 2015-09-25 NOTE — Telephone Encounter (Signed)
Pt came by the office to pick up the information that she requested on 09/24/15. I printed pt's labs and after visit summary. Thanks TNP

## 2015-10-08 ENCOUNTER — Telehealth: Payer: Self-pay | Admitting: Family Medicine

## 2015-10-08 DIAGNOSIS — R748 Abnormal levels of other serum enzymes: Secondary | ICD-10-CM

## 2015-10-08 NOTE — Telephone Encounter (Signed)
Pt would like to pick up lab sheets for reteat lab work.  She would like to pick it up this afternoon if possible.  Her call back is [phone]  Thanks, Barth Kirksteri

## 2015-10-08 NOTE — Telephone Encounter (Signed)
Met c for elevated liver enzymes. Thanks.

## 2015-10-08 NOTE — Telephone Encounter (Signed)
Pt advised.   Thanks,   -Laura  

## 2015-10-09 DIAGNOSIS — R748 Abnormal levels of other serum enzymes: Secondary | ICD-10-CM | POA: Diagnosis not present

## 2015-10-11 LAB — COMPREHENSIVE METABOLIC PANEL
A/G RATIO: 1.6 (ref 1.2–2.2)
ALK PHOS: 59 IU/L (ref 39–117)
ALT: 34 IU/L — AB (ref 0–32)
AST: 21 IU/L (ref 0–40)
Albumin: 4.1 g/dL (ref 3.5–5.5)
BUN/Creatinine Ratio: 12 (ref 9–23)
BUN: 9 mg/dL (ref 6–20)
CALCIUM: 8.9 mg/dL (ref 8.7–10.2)
CHLORIDE: 102 mmol/L (ref 96–106)
CO2: 23 mmol/L (ref 18–29)
Creatinine, Ser: 0.78 mg/dL (ref 0.57–1.00)
GFR calc Af Amer: 112 mL/min/{1.73_m2} (ref >59)
GFR calc non Af Amer: 97 mL/min/{1.73_m2} (ref >59)
Globulin, Total: 2.6 g/dL (ref 1.5–4.5)
Glucose: 151 mg/dL — ABNORMAL HIGH (ref 65–99)
POTASSIUM: 4.4 mmol/L (ref 3.5–5.2)
Sodium: 140 mmol/L (ref 134–144)
Total Protein: 6.7 g/dL (ref 6.0–8.5)

## 2015-10-12 ENCOUNTER — Telehealth: Payer: Self-pay

## 2015-10-12 NOTE — Telephone Encounter (Signed)
-----   Message from Lorie PhenixNancy Maloney, MD sent at 10/11/2015  6:50 AM EDT ----- Liver enzyme just slightly above normal, improved  Recheck at follow up when checking her sugar. Thanks.

## 2015-10-12 NOTE — Telephone Encounter (Signed)
Pt advised as directed below.   Thanks,   -Laura  

## 2015-11-11 ENCOUNTER — Other Ambulatory Visit: Payer: Self-pay | Admitting: Family Medicine

## 2015-11-11 DIAGNOSIS — F17200 Nicotine dependence, unspecified, uncomplicated: Secondary | ICD-10-CM

## 2015-11-12 ENCOUNTER — Encounter: Payer: Self-pay | Admitting: Family Medicine

## 2015-11-12 ENCOUNTER — Ambulatory Visit (INDEPENDENT_AMBULATORY_CARE_PROVIDER_SITE_OTHER): Payer: BLUE CROSS/BLUE SHIELD | Admitting: Family Medicine

## 2015-11-12 VITALS — BP 118/80 | HR 90 | Temp 98.8°F | Resp 16 | Wt 181.6 lb

## 2015-11-12 DIAGNOSIS — J069 Acute upper respiratory infection, unspecified: Secondary | ICD-10-CM | POA: Diagnosis not present

## 2015-11-12 MED ORDER — DOXYCYCLINE HYCLATE 100 MG PO TABS: 100.0000 mg | ORAL_TABLET | Freq: Two times a day (BID) | ORAL | 0 refills | Status: DC

## 2015-11-12 MED ORDER — HYDROCODONE-HOMATROPINE 5-1.5 MG/5ML PO SYRP: ORAL_SOLUTION | ORAL | 0 refills | Status: DC

## 2015-11-12 NOTE — Progress Notes (Signed)
Subjective:     Patient ID: Bethany Edwards, female   DOB: 10-Aug-1980, 35 y.o.   MRN: [id number]  HPI  Chief Complaint  Patient presents with  . Cough    Patient comes in office today with concerns of cough and congestion for the past two weeks. Associated with cough patient complains of the following symptoms; tightness/heaviness in chest, ear congestion and wheezing. Patient reports taking otc Mucinex, Robitussin and Nightquil qhs.   States she continues to smoke up to 1 ppd. Reports treatment failures with Wellbutrin, nicotine products, and Chantix.   Review of Systems     Objective:   Physical Exam  Constitutional: She appears well-developed and well-nourished. No distress.  Ears: T.M's intact without inflammation Sinuses: non-tender Throat: no tonsillar enlargement or exudate Neck: no cervical adenopathy Lungs: clear (shallow breathing to minimize cough with deep inspiration)     Assessment:    1. Upper respiratory infection - HYDROcodone-homatropine (HYCODAN) 5-1.5 MG/5ML syrup; 5 ml 4-6 hours as needed for cough  Dispense: 240 mL; Refill: 0 - doxycycline (VIBRA-TABS) 100 MG tablet; Take 1 tablet (100 mg total) by mouth 2 (two) times daily.  Dispense: 20 tablet; Refill: 0    Plan:    Discussed using albuterol twice daily and otc medication. Start abx if purulent sinus drainage or sputum. Minimize smoking.

## 2015-11-12 NOTE — Patient Instructions (Signed)
Try to use albuterol inhaler twice daily along with cough syrup and Mucinex D. If sinus drainage or sputum turn colors start antibiotic.

## 2015-12-02 ENCOUNTER — Encounter: Payer: Self-pay | Admitting: Physician Assistant

## 2015-12-02 ENCOUNTER — Ambulatory Visit (INDEPENDENT_AMBULATORY_CARE_PROVIDER_SITE_OTHER): Payer: BLUE CROSS/BLUE SHIELD | Admitting: Physician Assistant

## 2015-12-02 VITALS — BP 112/70 | HR 95 | Temp 98.3°F | Resp 16 | Wt 181.8 lb

## 2015-12-02 DIAGNOSIS — Z6830 Body mass index (BMI) 30.0-30.9, adult: Secondary | ICD-10-CM | POA: Insufficient documentation

## 2015-12-02 DIAGNOSIS — IMO0002 Reserved for concepts with insufficient information to code with codable children: Secondary | ICD-10-CM | POA: Insufficient documentation

## 2015-12-02 DIAGNOSIS — R601 Generalized edema: Secondary | ICD-10-CM

## 2015-12-02 DIAGNOSIS — E7800 Pure hypercholesterolemia, unspecified: Secondary | ICD-10-CM

## 2015-12-02 DIAGNOSIS — J4530 Mild persistent asthma, uncomplicated: Secondary | ICD-10-CM | POA: Diagnosis not present

## 2015-12-02 DIAGNOSIS — E663 Overweight: Secondary | ICD-10-CM | POA: Insufficient documentation

## 2015-12-02 DIAGNOSIS — E119 Type 2 diabetes mellitus without complications: Secondary | ICD-10-CM

## 2015-12-02 MED ORDER — FUROSEMIDE 20 MG PO TABS: 20.0000 mg | ORAL_TABLET | Freq: Every day | ORAL | 3 refills | Status: DC

## 2015-12-02 MED ORDER — MOMETASONE FURO-FORMOTEROL FUM 100-5 MCG/ACT IN AERO: 2.0000 | INHALATION_SPRAY | Freq: Every day | RESPIRATORY_TRACT | 0 refills | Status: DC

## 2015-12-02 NOTE — Patient Instructions (Signed)
Formoterol; Mometasone metered dose inhaler What is this medicine? FORMOTEROL; MOMETASONE (for [place] te rol; moe MET a sone) inhalation is a combination of two medicines that decrease inflammation and help to open up the airways in your lungs. It is used to treat asthma. Do NOT use in an acute asthma attack. This medicine may be used for other purposes; ask your health care provider or pharmacist if you have questions. What should I tell my health care provider before I take this medicine? They need to know if you have any of these conditions: -adrenal tumor -aneurysm -bone problems -diabetes -glaucoma -heart disease or irregular heartbeat -high blood pressure -immune system problems -infection -seizures -thyroid problems -worsening asthma -an unusual or allergic reaction to formoterol, mometasone, other medicines, foods, dyes, or preservatives -pregnant or trying to get pregnant -breast-feeding How should I use this medicine? This medicine is inhaled through the mouth. Follow the directions on the prescription label. Rinse your mouth with water after use. Make sure not to swallow the water. Take your medicine at regular intervals. Do not take your medicine more often than directed. Do not stop taking except on your doctor's advice. Make sure that you are using your inhaler correctly. Ask your doctor or health care provider if you have any questions. A special MedGuide will be given to you by the pharmacist with each prescription and refill. Be sure to read this information carefully each time. Talk to your pediatrician regarding the use of this medicine in children. Special care may be needed. Overdosage: If you think you have taken too much of this medicine contact a poison control center or emergency room at once. NOTE: This medicine is only for you. Do not share this medicine with others. What if I miss a dose? If you miss a dose, use it as soon as you remember. If it is almost time for  your next dose, use only that dose and continue with your regular schedule, spacing doses evenly. Do not use double or extra doses. What may interact with this medicine? Do not take this medicine with any of the following mediations: -MAOIs like Carbex, Eldepryl, Marplan, Nardil, and Parnate This medicine may also interact with the following medications: -aminophylline or theophylline -antiviral medicines for HIV or AIDS -certain antibiotics like clarithromycin, linezolid, and telithromycin -certain medicines for blood pressure, heart disease, or irregular heart beat -certain medicines for colds -certain medicines for depression or emotional conditions -certain medicines for fungal infections like ketoconazole and itraconazole -diuretics -other medicines for breathing problems This list may not describe all possible interactions. Give your health care provider a list of all the medicines, herbs, non-prescription drugs, or dietary supplements you use. Also tell them if you smoke, drink alcohol, or use illegal drugs. Some items may interact with your medicine. What should I watch for while using this medicine? Visit your doctor for regular check ups. Tell your doctor or health care professional if your symptoms do not get better. If your symptoms get worse or if you need your short-acting inhalers more often, call your doctor right away. Do not use this medicine more than every 12 hours. If you have asthma, be aware that using this medicine may increase your risk of dying from asthma-related problems. Talk to your doctor about the risks and benefits of taking this medicine. NEVER use this medicine for an acute asthma attack. This medicine may increase your risk of getting an infection. Tell your doctor or health care professional if you are around  anyone with measles or chickenpox, or if you develop sores or blisters that do not heal properly. What side effects may I notice from receiving this  medicine? Side effects that you should report to your doctor or health care professional as soon as possible: -allergic reactions like skin rash or hives, swelling of the face, lips, or tongue -breathing problems -chest pain -dizziness or lightheaded -fever or chills -high blood pressure -irregular heartbeat -vision problems Side effects that usually do not require medical attention (Report these to your doctor or health care professional if they continue or are bothersome.): -coughing, hoarseness, throat irritation -different taste in mouth -headache -nervousness -stomach upset -stuffy nose -tremor This list may not describe all possible side effects. Call your doctor for medical advice about side effects. You may report side effects to FDA at 1-800-FDA-1088. Where should I keep my medicine? Keep out of the reach of children. Store at room temperature between 59 and 86 degrees F (15 and 30 degrees C). Throw away the inhaler after the dose counter reaches 0 or after the expiration date, whichever comes first. Avoid exposure to heat, fire, and flame. NOTE: This sheet is a summary. It may not cover all possible information. If you have questions about this medicine, talk to your doctor, pharmacist, or health care provider.    2016, Elsevier/Gold Standard. (2012-08-09 16:04:55) Type 2 Diabetes Mellitus, Adult Type 2 diabetes mellitus, often simply referred to as type 2 diabetes, is a long-lasting (chronic) disease. In type 2 diabetes, the pancreas does not make enough insulin (a hormone), the cells are less responsive to the insulin that is made (insulin resistance), or both. Normally, insulin moves sugars from food into the tissue cells. The tissue cells use the sugars for energy. The lack of insulin or the lack of normal response to insulin causes excess sugars to build up in the blood instead of going into the tissue cells. As a result, high blood sugar (hyperglycemia) develops. The effect  of high sugar (glucose) levels can cause many complications. Type 2 diabetes was also previously called adult-onset diabetes, but it can occur at any age.  RISK FACTORS  A person is predisposed to developing type 2 diabetes if someone in the family has the disease and also has one or more of the following primary risk factors:  Weight gain, or being overweight or obese.  An inactive lifestyle.  A history of consistently eating high-calorie foods. Maintaining a normal weight and regular physical activity can reduce the chance of developing type 2 diabetes. SYMPTOMS  A person with type 2 diabetes may not show symptoms initially. The symptoms of type 2 diabetes appear slowly. The symptoms include:  Increased thirst (polydipsia).  Increased urination (polyuria).  Increased urination during the night (nocturia).  Sudden or unexplained weight changes.  Frequent, recurring infections.  Tiredness (fatigue).  Weakness.  Vision changes, such as blurred vision.  Fruity smell to your breath.  Abdominal pain.  Nausea or vomiting.  Cuts or bruises which are slow to heal.  Tingling or numbness in the hands or feet.  An open skin wound (ulcer). DIAGNOSIS Type 2 diabetes is frequently not diagnosed until complications of diabetes are present. Type 2 diabetes is diagnosed when symptoms or complications are present and when blood glucose levels are increased. Your blood glucose level may be checked by one or more of the following blood tests:  A fasting blood glucose test. You will not be allowed to eat for at least 8 hours before a  blood sample is taken.  A random blood glucose test. Your blood glucose is checked at any time of the day regardless of when you ate.  A hemoglobin A1c blood glucose test. A hemoglobin A1c test provides information about blood glucose control over the previous 3 months.  An oral glucose tolerance test (OGTT). Your blood glucose is measured after you have  not eaten (fasted) for 2 hours and then after you drink a glucose-containing beverage. TREATMENT   You may need to take insulin or diabetes medicine daily to keep blood glucose levels in the desired range.  If you use insulin, you may need to adjust the dosage depending on the carbohydrates that you eat with each meal or snack.  Lifestyle changes are recommended as part of your treatment. These may include:  Following an individualized diet plan developed by a nutritionist or dietitian.  Exercising daily. Your health care providers will set individualized treatment goals for you based on your age, your medicines, how long you have had diabetes, and any other medical conditions you have. Generally, the goal of treatment is to maintain the following blood glucose levels:  Before meals (preprandial): 80-130 mg/dL.  After meals (postprandial): below 180 mg/dL.  A1c: less than 6.5-7%. HOME CARE INSTRUCTIONS   Have your hemoglobin A1c level checked twice a year.  Perform daily blood glucose monitoring as directed by your health care provider.  Monitor urine ketones when you are ill and as directed by your health care provider.  Take your diabetes medicine or insulin as directed by your health care provider to maintain your blood glucose levels in the desired range.  Never run out of diabetes medicine or insulin. It is needed every day.  If you are using insulin, you may need to adjust the amount of insulin given based on your intake of carbohydrates. Carbohydrates can raise blood glucose levels but need to be included in your diet. Carbohydrates provide vitamins, minerals, and fiber which are an essential part of a healthy diet. Carbohydrates are found in fruits, vegetables, whole grains, dairy products, legumes, and foods containing added sugars.  Eat healthy foods. You should make an appointment to see a registered dietitian to help you create an eating plan that is right for  you.  Lose weight if you are overweight.  Carry a medical alert card or wear your medical alert jewelry.  Carry a 15-gram carbohydrate snack with you at all times to treat low blood glucose (hypoglycemia). Some examples of 15-gram carbohydrate snacks include:  Glucose tablets, 3 or 4.  Glucose gel, 15-gram tube.  Raisins, 2 tablespoons (24 grams).  Jelly beans, 6.  Animal crackers, 8.  Regular pop, 4 ounces (120 mL).  Gummy treats, 9.  Recognize hypoglycemia. Hypoglycemia occurs with blood glucose levels of 70 mg/dL and below. The risk for hypoglycemia increases when fasting or skipping meals, during or after intense exercise, and during sleep. Hypoglycemia symptoms can include:  Tremors or shakes.  Decreased ability to concentrate.  Sweating.  Increased heart rate.  Headache.  Dry mouth.  Hunger.  Irritability.  Anxiety.  Restless sleep.  Altered speech or coordination.  Confusion.  Treat hypoglycemia promptly. If you are alert and able to safely swallow, follow the 15:15 rule:  Take 15-20 grams of rapid-acting glucose or carbohydrate. Rapid-acting options include glucose gel, glucose tablets, or 4 ounces (120 mL) of fruit juice, regular soda, or low-fat milk.  Check your blood glucose level 15 minutes after taking the glucose.  Take 15-20  grams more of glucose if the repeat blood glucose level is still 70 mg/dL or below.  Eat a meal or snack within 1 hour once blood glucose levels return to normal.  Be alert to feeling very thirsty and urinating more frequently than usual, which are early signs of hyperglycemia. An early awareness of hyperglycemia allows for prompt treatment. Treat hyperglycemia as directed by your health care provider.  Engage in at least 150 minutes of moderate-intensity physical activity a week, spread over at least 3 days of the week or as directed by your health care provider. In addition, you should engage in resistance exercise at  least 2 times a week or as directed by your health care provider. Try to spend no more than 90 minutes at one time inactive.  Adjust your medicine and food intake as needed if you start a new exercise or sport.  Follow your sick-day plan anytime you are unable to eat or drink as usual.  Do not use any tobacco products including cigarettes, chewing tobacco, or electronic cigarettes. If you need help quitting, ask your health care provider.  Limit alcohol intake to no more than 1 drink per day for nonpregnant women and 2 drinks per day for men. You should drink alcohol only when you are also eating food. Talk with your health care provider whether alcohol is safe for you. Tell your health care provider if you drink alcohol several times a week.  Keep all follow-up visits as directed by your health care provider. This is important.  Schedule an eye exam soon after the diagnosis of type 2 diabetes and then annually.  Perform daily skin and foot care. Examine your skin and feet daily for cuts, bruises, redness, nail problems, bleeding, blisters, or sores. A foot exam by a health care provider should be done annually.  Brush your teeth and gums at least twice a day and floss at least once a day. Follow up with your dentist regularly.  Share your diabetes management plan with your workplace or school.  Keep your immunizations up to date. It is recommended that you receive a flu (influenza) vaccine every year. It is also recommended that you receive a pneumonia (pneumococcal) vaccine. If you are 65 years of age or older and have never received a pneumonia vaccine, this vaccine may be given as a series of two separate shots. Ask your health care provider which additional vaccines may be recommended.  Learn to manage stress.  Obtain ongoing diabetes education and support as needed.  Participate in or seek rehabilitation as needed to maintain or improve independence and quality of life. Request a  physical or occupational therapy referral if you are having foot or hand numbness, or difficulties with grooming, dressing, eating, or physical activity. SEEK MEDICAL CARE IF:   You are unable to eat food or drink fluids for more than 6 hours.  You have nausea and vomiting for more than 6 hours.  Your blood glucose level is over 240 mg/dL.  There is a change in mental status.  You develop an additional serious illness.  You have diarrhea for more than 6 hours.  You have been sick or have had a fever for a couple of days and are not getting better.  You have pain during any physical activity.  SEEK IMMEDIATE MEDICAL CARE IF:  You have difficulty breathing.  You have moderate to large ketone levels.   This information is not intended to replace advice given to you by your  health care provider. Make sure you discuss any questions you have with your health care provider.   Document Released: 04/04/2005 Document Revised: 12/24/2014 Document Reviewed: 11/01/2011 Elsevier Interactive Patient Education [employer].

## 2015-12-02 NOTE — Progress Notes (Signed)
Patient: Bethany Edwards Female    DOB: 12/22/1980   35 y.o.   MRN: [id number] Visit Date: 12/02/2015  Today's Provider: Margaretann LovelessJennifer M Columbia Pandey, PA-C   Chief Complaint  Patient presents with  . Follow-up    Diabetes and Cholesterol/liver enzymes   Subjective:    HPI  Diabetes Mellitus Type II, Follow-up:   Lab Results  Component Value Date   HGBA1C 7.7 09/09/2015   HGBA1C 7.0 01/05/2015   HGBA1C 7.3 (A) 08/29/2014    Last seen for diabetes 3 months ago.  Management since then includes Glipizide increased to 5 MG, because she cannot tolerate the Increase on Metformin. She reports excellent compliance with treatment. She is not having side effects.  Current symptoms include none and have been stable. Home blood sugar records: fasting range: 100's She a little stress lately. She is getting marry pretty soon. She reports she is eating more. Episodes of hypoglycemia? no   Most Recent Eye Exam: UTD Weight trend: stable Current diet: well balanced Current exercise: Walking (fast)  Pertinent Labs:    Component Value Date/Time   CHOL 204 (H) 09/09/2015 1052   TRIG 187 (H) 09/09/2015 1052   HDL 26 (L) 09/09/2015 1052   LDLCALC 141 (H) 09/09/2015 1052   CREATININE 0.78 10/09/2015 0932    Wt Readings from Last 3 Encounters:  12/02/15 181 lb 12.8 oz (82.5 kg)  11/12/15 181 lb 9.6 oz (82.4 kg)  09/09/15 178 lb (80.7 kg)    ------------------------------------------------------------------------  Lipid/Cholesterol, Follow-up:   Last seen for this3 months ago.  Management changes since that visit include none. . Last Lipid Panel:    Component Value Date/Time   CHOL 204 (H) 09/09/2015 1052   TRIG 187 (H) 09/09/2015 1052   HDL 26 (L) 09/09/2015 1052   CHOLHDL 7.8 (H) 09/09/2015 1052   LDLCALC 141 (H) 09/09/2015 1052    Risk factors for vascular disease include diabetes mellitus  She reports excellent compliance with treatment. She is not having side  effects.  Current symptoms include none and have been stable.   Wt Readings from Last 3 Encounters:  12/02/15 181 lb 12.8 oz (82.5 kg)  11/12/15 181 lb 9.6 oz (82.4 kg)  09/09/15 178 lb (80.7 kg)    ------------------------------------------------------------------- She reports her hand and feet have been swelling for the past three weeks. She reports that it feels tight. No tingling.    Allergies  Allergen Reactions  . Penicillins Rash    Can take Cephalosporin   Current Meds  Medication Sig  . albuterol (PROVENTIL HFA;VENTOLIN HFA) 108 (90 Base) MCG/ACT inhaler Inhale 2 puffs into the lungs every 6 (six) hours as needed for wheezing or shortness of breath.  Marland Kitchen. atorvastatin (LIPITOR) 10 MG tablet Take 1 tablet (10 mg total) by mouth daily.  Marland Kitchen. buPROPion (WELLBUTRIN SR) 150 MG 12 hr tablet Take 1 tablet (150 mg total) by mouth 2 (two) times daily.  Marland Kitchen. glipiZIDE (GLIPIZIDE XL) 5 MG 24 hr tablet Take 1 tablet (5 mg total) by mouth daily with breakfast.  . metFORMIN (GLUCOPHAGE) 500 MG tablet TAKE 1 TABLET (500 MG TOTAL) BY MOUTH 2 (TWO) TIMES DAILY WITH A MEAL.    Review of Systems  Constitutional: Negative.   Respiratory: Positive for cough. Negative for chest tightness, shortness of breath and wheezing.   Cardiovascular: Positive for leg swelling (on her legs and hands). Negative for chest pain and palpitations.  Gastrointestinal: Negative for abdominal pain and nausea.  Neurological: Negative for dizziness and headaches.  Psychiatric/Behavioral:       Increased stress as she is getting married on 12/26/15    Social History  Substance Use Topics  . Smoking status: Current Every Day Smoker    Packs/day: 0.50  . Smokeless tobacco: Never Used  . Alcohol use No   Objective:   BP 112/70 (BP Location: Left Arm, Patient Position: Sitting, Cuff Size: Normal)   Pulse 95   Temp 98.3 F (36.8 C) (Oral)   Resp 16   Wt 181 lb 12.8 oz (82.5 kg)   LMP 11/07/2015   SpO2 97%   BMI  32.20 kg/m   Physical Exam  Constitutional: She appears well-developed and well-nourished. No distress.  Neck: Normal range of motion. Neck supple. No JVD present. No tracheal deviation present. No thyromegaly present.  Cardiovascular: Normal rate, regular rhythm and normal heart sounds.  Exam reveals no gallop and no friction rub.   No murmur heard. Pulmonary/Chest: Effort normal and breath sounds normal. No respiratory distress. She has no wheezes. She has no rales.  Musculoskeletal: She exhibits edema (hands).  Lymphadenopathy:    She has no cervical adenopathy.  Skin: She is not diaphoretic.  Psychiatric: She has a normal mood and affect. Her behavior is normal. Judgment and thought content normal.  Vitals reviewed.     Assessment & Plan:     1. Type 2 diabetes mellitus without complication, unspecified long term insulin use status (HCC) Stable. Continue current medical treatment plan. Continue metformin 500 BID (intolerant to increase) and glipizide 5mg . Will check labs as below and f/u pending results. I will see her back in 3 months for CPE.  - CBC - Comprehensive Metabolic Panel (CMET) - HgB A1c  2. Hypercholesteremia Stable. Continue current medical treatment plan. Continue atorvastatin 10mg . Will check labs as below and f/u pending results. - CBC - Comprehensive Metabolic Panel (CMET) - Lipid Profile  3. Generalized edema Will do trial of lasix for edema. Checking labs as above. Once labs stable she may begin treatment. Will f/u in 3 months to see how she is doing.  - furosemide (LASIX) 20 MG tablet; Take 1 tablet (20 mg total) by mouth daily.  Dispense: 30 tablet; Refill: 3  4. Reactive airway disease, mild persistent, uncomplicated Sample of dulera given to see if this helps chronic cough. Xray was normal. Patient is a smoker.        Margaretann LovelessJennifer M Ladavia Lindenbaum, PA-C  [facility]

## 2015-12-03 ENCOUNTER — Telehealth: Payer: Self-pay

## 2015-12-03 LAB — COMPREHENSIVE METABOLIC PANEL
ALBUMIN: 4.4 g/dL (ref 3.5–5.5)
ALK PHOS: 75 IU/L (ref 39–117)
ALT: 37 IU/L — AB (ref 0–32)
AST: 20 IU/L (ref 0–40)
Albumin/Globulin Ratio: 1.8 (ref 1.2–2.2)
BILIRUBIN TOTAL: 0.3 mg/dL (ref 0.0–1.2)
BUN/Creatinine Ratio: 9 (ref 9–23)
BUN: 7 mg/dL (ref 6–20)
CALCIUM: 9.4 mg/dL (ref 8.7–10.2)
CHLORIDE: 97 mmol/L (ref 96–106)
CO2: 24 mmol/L (ref 18–29)
Creatinine, Ser: 0.76 mg/dL (ref 0.57–1.00)
GFR calc Af Amer: 116 mL/min/{1.73_m2} (ref >59)
GFR calc non Af Amer: 101 mL/min/{1.73_m2} (ref >59)
GLUCOSE: 162 mg/dL — AB (ref 65–99)
Globulin, Total: 2.5 g/dL (ref 1.5–4.5)
Potassium: 5 mmol/L (ref 3.5–5.2)
Sodium: 139 mmol/L (ref 134–144)
TOTAL PROTEIN: 6.9 g/dL (ref 6.0–8.5)

## 2015-12-03 LAB — LIPID PANEL
CHOL/HDL RATIO: 5.8 ratio — AB (ref 0.0–4.4)
CHOLESTEROL TOTAL: 169 mg/dL (ref 100–199)
HDL: 29 mg/dL — ABNORMAL LOW (ref >39)
LDL Calculated: 93 mg/dL (ref 0–99)
TRIGLYCERIDES: 237 mg/dL — AB (ref 0–149)
VLDL Cholesterol Cal: 47 mg/dL — ABNORMAL HIGH (ref 5–40)

## 2015-12-03 LAB — CBC
HEMATOCRIT: 37.7 % (ref 34.0–46.6)
HEMOGLOBIN: 12.7 g/dL (ref 11.1–15.9)
MCH: 29.8 pg (ref 26.6–33.0)
MCHC: 33.7 g/dL (ref 31.5–35.7)
MCV: 89 fL (ref 79–97)
Platelets: 230 10*3/uL (ref 150–379)
RBC: 4.26 x10E6/uL (ref 3.77–5.28)
RDW: 14.7 % (ref 12.3–15.4)
WBC: 8.8 10*3/uL (ref 3.4–10.8)

## 2015-12-03 LAB — HEMOGLOBIN A1C
ESTIMATED AVERAGE GLUCOSE: 174 mg/dL
HEMOGLOBIN A1C: 7.7 % — AB (ref 4.8–5.6)

## 2015-12-03 NOTE — Telephone Encounter (Signed)
-----   Message from Margaretann LovelessJennifer M Burnette, PA-C sent at 12/03/2015  2:10 PM EDT ----- HgBA1c stable at 7.7. Total cholesterol better but triglycerides have increased showing less control of diet. I know we discussed this in the office with stress eating. Try to adhere to a healthier diet and add physical activity to a daily routine. Try to drink more water and avoid snacking. Will not make any changes to medications at this time. Will recheck in 3 months and if still no improvements will make medication adjustments then.

## 2015-12-03 NOTE — Telephone Encounter (Signed)
Patient advised as directed below. Patient states she has already scheduled follow up appointment.   Thanks, [employer]

## 2015-12-10 ENCOUNTER — Ambulatory Visit: Payer: BLUE CROSS/BLUE SHIELD | Admitting: Physician Assistant

## 2016-02-15 ENCOUNTER — Other Ambulatory Visit: Payer: Self-pay | Admitting: Physician Assistant

## 2016-02-15 DIAGNOSIS — E119 Type 2 diabetes mellitus without complications: Secondary | ICD-10-CM

## 2016-02-15 MED ORDER — METFORMIN HCL 500 MG PO TABS: ORAL_TABLET | ORAL | 1 refills | Status: DC

## 2016-02-15 NOTE — Telephone Encounter (Signed)
Pt contacted office for refill request on the following medications:  metFORMIN (GLUCOPHAGE) 500 MG tablet.  [facility] [address]  [phone]#[phone]/MW

## 2016-03-03 ENCOUNTER — Encounter: Payer: BLUE CROSS/BLUE SHIELD | Admitting: Physician Assistant

## 2016-03-03 NOTE — Progress Notes (Deleted)
       Subjective:    Review of Systems    Objective:     Physical Exam

## 2016-03-31 ENCOUNTER — Ambulatory Visit (INDEPENDENT_AMBULATORY_CARE_PROVIDER_SITE_OTHER): Payer: BLUE CROSS/BLUE SHIELD | Admitting: Physician Assistant

## 2016-03-31 ENCOUNTER — Encounter: Payer: Self-pay | Admitting: Physician Assistant

## 2016-03-31 VITALS — BP 102/72 | HR 88 | Temp 99.0°F | Resp 16 | Ht 63.0 in | Wt 177.0 lb

## 2016-03-31 DIAGNOSIS — Z713 Dietary counseling and surveillance: Secondary | ICD-10-CM | POA: Diagnosis not present

## 2016-03-31 DIAGNOSIS — F17200 Nicotine dependence, unspecified, uncomplicated: Secondary | ICD-10-CM

## 2016-03-31 DIAGNOSIS — R601 Generalized edema: Secondary | ICD-10-CM | POA: Diagnosis not present

## 2016-03-31 DIAGNOSIS — E6609 Other obesity due to excess calories: Secondary | ICD-10-CM

## 2016-03-31 DIAGNOSIS — E119 Type 2 diabetes mellitus without complications: Secondary | ICD-10-CM

## 2016-03-31 DIAGNOSIS — Z6831 Body mass index (BMI) 31.0-31.9, adult: Secondary | ICD-10-CM

## 2016-03-31 LAB — POCT GLYCOSYLATED HEMOGLOBIN (HGB A1C)
Est. average glucose Bld gHb Est-mCnc: 186
HEMOGLOBIN A1C: 8.1

## 2016-03-31 MED ORDER — PHENTERMINE HCL 37.5 MG PO TABS: 37.5000 mg | ORAL_TABLET | Freq: Every day | ORAL | 0 refills | Status: DC

## 2016-03-31 MED ORDER — ATORVASTATIN CALCIUM 10 MG PO TABS: 10.0000 mg | ORAL_TABLET | Freq: Every day | ORAL | 5 refills | Status: DC

## 2016-03-31 MED ORDER — FUROSEMIDE 20 MG PO TABS: 20.0000 mg | ORAL_TABLET | Freq: Every day | ORAL | 5 refills | Status: DC

## 2016-03-31 MED ORDER — EMPAGLIFLOZIN 25 MG PO TABS
25.0000 mg | ORAL_TABLET | Freq: Every day | ORAL | 5 refills | Status: DC
Start: 2016-03-31 — End: 2016-10-09

## 2016-03-31 MED ORDER — BUPROPION HCL ER (SR) 150 MG PO TB12: 150.0000 mg | ORAL_TABLET | Freq: Two times a day (BID) | ORAL | 5 refills | Status: DC

## 2016-03-31 NOTE — Progress Notes (Signed)
Patient: Bethany Edwards Female    DOB: 04/19/1980   35 y.o.   MRN: [id number] Visit Date: 03/31/2016  Today's Provider: Margaretann LovelessJennifer M Seren Chaloux, PA-C   Chief Complaint  Patient presents with  . Diabetes  . Edema   Subjective:    HPI      Diabetes Mellitus Type II, Follow-up:   Lab Results  Component Value Date   HGBA1C 8.1 03/31/2016   HGBA1C 7.7 (H) 12/02/2015   HGBA1C 7.7 09/09/2015    Last seen for diabetes 4 months ago.  Management since then includes continuing medications. She reports fair compliance with treatment. Pt ran out of Glipizide 2-3 weeks ago. She is not having side effects.  Current symptoms include hyperglycemia and have been stable. Home blood sugar records: habe been as high as 300 after running out of Glipizide.She also reports that she had a couple of days where she did not have her metformin as well.  Episodes of hypoglycemia? no   Most Recent Eye Exam: 1 year ago Weight trend: stable Prior visit with dietician: no Current diet: in general, a "healthy" diet   Current exercise: walking 3-4 nights per week for 2-3 miles.  Pertinent Labs:    Component Value Date/Time   CHOL 169 12/02/2015 0940   TRIG 237 (H) 12/02/2015 0940   HDL 29 (L) 12/02/2015 0940   LDLCALC 93 12/02/2015 0940   CREATININE 0.76 12/02/2015 0940    Wt Readings from Last 3 Encounters:  03/31/16 177 lb (80.3 kg)  12/02/15 181 lb 12.8 oz (82.5 kg)  11/12/15 181 lb 9.6 oz (82.4 kg)    ------------------------------------------------------------------------  Follow up for edema  The patient was last seen for this 4 months ago. Changes made at last visit include adding Lasix.  She reports excellent compliance with treatment. She feels that condition is Improved. She is not having side effects.   ------------------------------------------------------------------------------------  Obesity Pt is concerned because she has been actively trying to diet and exercise  to lose weight. Pt is walking 2-3 miles 3-4 nights per week. Pt has increased water intake. Pt is unsure of caloric intake. Has lost 4 pounds since LOV.   Allergies  Allergen Reactions  . Penicillins Rash    Can take Cephalosporin     Current Outpatient Prescriptions:  .  atorvastatin (LIPITOR) 10 MG tablet, Take 1 tablet (10 mg total) by mouth daily., Disp: 30 tablet, Rfl: 5 .  buPROPion (WELLBUTRIN SR) 150 MG 12 hr tablet, Take 1 tablet (150 mg total) by mouth 2 (two) times daily., Disp: 60 tablet, Rfl: 5 .  furosemide (LASIX) 20 MG tablet, Take 1 tablet (20 mg total) by mouth daily., Disp: 30 tablet, Rfl: 5 .  metFORMIN (GLUCOPHAGE) 500 MG tablet, TAKE 1 TABLET (500 MG TOTAL) BY MOUTH 2 (TWO) TIMES DAILY WITH A MEAL., Disp: 180 tablet, Rfl: 1 .  albuterol (PROVENTIL HFA;VENTOLIN HFA) 108 (90 Base) MCG/ACT inhaler, Inhale 2 puffs into the lungs every 6 (six) hours as needed for wheezing or shortness of breath. (Patient not taking: Reported on 03/31/2016), Disp: 1 Inhaler, Rfl: 0 .  ALPRAZolam (XANAX) 0.5 MG tablet, , Disp: , Rfl:  .  empagliflozin (JARDIANCE) 25 MG TABS tablet, Take 25 mg by mouth daily., Disp: 30 tablet, Rfl: 5 .  phentermine (ADIPEX-P) 37.5 MG tablet, Take 1 tablet (37.5 mg total) by mouth daily before breakfast., Disp: 30 tablet, Rfl: 0  Review of Systems  Constitutional: Negative for activity change, appetite  change, chills, diaphoresis, fatigue, fever and unexpected weight change (pt is struggling to lose weight).  Respiratory: Negative.   Cardiovascular: Negative.   Gastrointestinal: Negative.   Endocrine: Negative for polydipsia, polyphagia and polyuria.  Neurological: Negative.   Psychiatric/Behavioral: Negative.     Social History  Substance Use Topics  . Smoking status: Current Every Day Smoker    Packs/day: 0.50  . Smokeless tobacco: Never Used  . Alcohol use No   Objective:   BP 102/72 (BP Location: Left Arm, Patient Position: Sitting, Cuff Size:  Large)   Pulse 88   Temp 99 F (37.2 C) (Oral)   Resp 16   Ht 5\' 3"  (1.6 m)   Wt 177 lb (80.3 kg)   LMP 03/02/2016   BMI 31.35 kg/m   Physical Exam  Constitutional: She appears well-developed and well-nourished. No distress.  Neck: Normal range of motion. Neck supple. No JVD present. No tracheal deviation present. No thyromegaly present.  Cardiovascular: Normal rate, regular rhythm and normal heart sounds.  Exam reveals no gallop and no friction rub.   No murmur heard. Pulmonary/Chest: Effort normal and breath sounds normal. No respiratory distress. She has no wheezes. She has no rales.  Musculoskeletal: She exhibits no edema.  Lymphadenopathy:    She has no cervical adenopathy.  Skin: She is not diaphoretic.  Psychiatric: She has a normal mood and affect. Her behavior is normal. Judgment and thought content normal.  Vitals reviewed.      Assessment & Plan:     1. Type 2 diabetes mellitus without complication, unspecified long term insulin use status (HCC) Uncontrolled type 2 diabetes with A1c increased to 8.1. She is to continue metformin 500 mg twice daily and will add Jardiance 25 mg as below. The Jardiance will be in place of glipizide. We will recheck A1c in 2-3 months. - POCT glycosylated hemoglobin (Hb A1C) - empagliflozin (JARDIANCE) 25 MG TABS tablet; Take 25 mg by mouth daily.  Dispense: 30 tablet; Refill: 5  2. Generalized edema Stable. Diagnosis pulled for medication refill. Continue current medical treatment plan. - furosemide (LASIX) 20 MG tablet; Take 1 tablet (20 mg total) by mouth daily.  Dispense: 30 tablet; Refill: 5  3. Compulsive tobacco user syndrome Stable. Diagnosis pulled for medication refill. Continue current medical treatment plan. - buPROPion (WELLBUTRIN SR) 150 MG 12 hr tablet; Take 1 tablet (150 mg total) by mouth 2 (two) times daily.  Dispense: 60 tablet; Refill: 5  4. Encounter for weight loss counseling Patient has been working on weight  loss with trying TEE to healthier diet and increase physical activity. She is not currently calorie counting. I want her to start a food diary and try to limit herself to 1200 cal daily. I will also give her phentermine as below for appetite suppression. I will see her back in one month's time to recheck her weight and see how she is doing with her weight loss progress. - phentermine (ADIPEX-P) 37.5 MG tablet; Take 1 tablet (37.5 mg total) by mouth daily before breakfast.  Dispense: 30 tablet; Refill: 0  5. Class 1 obesity due to excess calories with serious comorbidity and body mass index (BMI) of 31.0 to 31.9 in adult See above medical treatment plan. - phentermine (ADIPEX-P) 37.5 MG tablet; Take 1 tablet (37.5 mg total) by mouth daily before breakfast.  Dispense: 30 tablet; Refill: 0     Patient seen and examined by Margaretann LovelessJennifer M Stoy Fenn, PA-C, and note scribed by Allene DillonEmily Drozdowski, CMA.  Mar Daring, PA-C  [facility]

## 2016-03-31 NOTE — Patient Instructions (Signed)

## 2016-05-05 ENCOUNTER — Ambulatory Visit: Payer: BLUE CROSS/BLUE SHIELD | Admitting: Physician Assistant

## 2016-07-06 ENCOUNTER — Encounter: Payer: Self-pay | Admitting: Physician Assistant

## 2016-07-06 ENCOUNTER — Ambulatory Visit (INDEPENDENT_AMBULATORY_CARE_PROVIDER_SITE_OTHER): Payer: BLUE CROSS/BLUE SHIELD | Admitting: Physician Assistant

## 2016-07-06 VITALS — BP 112/70 | HR 88 | Temp 98.8°F | Resp 16 | Wt 162.0 lb

## 2016-07-06 DIAGNOSIS — L0291 Cutaneous abscess, unspecified: Secondary | ICD-10-CM

## 2016-07-06 MED ORDER — DOXYCYCLINE HYCLATE 100 MG PO TABS: 100.0000 mg | ORAL_TABLET | Freq: Two times a day (BID) | ORAL | 0 refills | Status: AC

## 2016-07-06 NOTE — Progress Notes (Signed)
Patient: Bethany Edwards Female    DOB: 01/02/1981   36 y.o.   MRN: [id number] Visit Date: 07/06/2016  Today's Provider: Trey SailorsAdriana M Pollak, PA-C   Chief Complaint  Patient presents with  . Abscess   Subjective:    HPI   Abscess: Patient is 36 y/o woman with history of Type II DM who presents for evaluation of a cutaneous abscess. Lesion is located in the groin. Onset was 2 weeks ago. Symptoms have gradually worsened. Abscess has associated symptoms of pain. Patient does have previous history of cutaneous abscesses. No fevers, chills, nausea, vomiting.       Allergies  Allergen Reactions  . Penicillins Rash    Can take Cephalosporin     Current Outpatient Prescriptions:  .  albuterol (PROVENTIL HFA;VENTOLIN HFA) 108 (90 Base) MCG/ACT inhaler, Inhale 2 puffs into the lungs every 6 (six) hours as needed for wheezing or shortness of breath., Disp: 1 Inhaler, Rfl: 0 .  ALPRAZolam (XANAX) 0.5 MG tablet, , Disp: , Rfl:  .  atorvastatin (LIPITOR) 10 MG tablet, Take 1 tablet (10 mg total) by mouth daily., Disp: 30 tablet, Rfl: 5 .  buPROPion (WELLBUTRIN SR) 150 MG 12 hr tablet, Take 1 tablet (150 mg total) by mouth 2 (two) times daily., Disp: 60 tablet, Rfl: 5 .  empagliflozin (JARDIANCE) 25 MG TABS tablet, Take 25 mg by mouth daily., Disp: 30 tablet, Rfl: 5 .  furosemide (LASIX) 20 MG tablet, Take 1 tablet (20 mg total) by mouth daily., Disp: 30 tablet, Rfl: 5 .  metFORMIN (GLUCOPHAGE) 500 MG tablet, TAKE 1 TABLET (500 MG TOTAL) BY MOUTH 2 (TWO) TIMES DAILY WITH A MEAL., Disp: 180 tablet, Rfl: 1 .  phentermine (ADIPEX-P) 37.5 MG tablet, Take 1 tablet (37.5 mg total) by mouth daily before breakfast., Disp: 30 tablet, Rfl: 0  Review of Systems  Constitutional: Negative.     Social History  Substance Use Topics  . Smoking status: Current Every Day Smoker    Packs/day: 0.50  . Smokeless tobacco: Never Used  . Alcohol use No   Objective:   BP 112/70 (BP Location: Left Arm,  Patient Position: Sitting, Cuff Size: Normal)   Pulse 88   Temp 98.8 F (37.1 C) (Oral)   Resp 16   Wt 162 lb (73.5 kg)   LMP 06/27/2016   BMI 28.70 kg/m  Vitals:   07/06/16 1553  BP: 112/70  Pulse: 88  Resp: 16  Temp: 98.8 F (37.1 C)  TempSrc: Oral  Weight: 162 lb (73.5 kg)     Physical Exam  Cardiovascular: Normal rate.   Pulmonary/Chest: Effort normal.  Genitourinary:     Genitourinary Comments: Tender to palpation. No inguinal lymphadenopathy.   Skin: Rash noted. There is erythema.  Psychiatric: She has a normal mood and affect. Her behavior is normal.  Vitals reviewed.       Assessment & Plan:     1. Abscess  Have provided abx. Due to likely presence of sinus tract, will probably have to refer to surgery if not healing. Have counseled patient on this. Call back with problems or if not resolving.   - doxycycline (VIBRA-TABS) 100 MG tablet; Take 1 tablet (100 mg total) by mouth 2 (two) times daily.  Dispense: 14 tablet; Refill: 0  Return if symptoms worsen or fail to improve.  The entirety of the information documented in the History of Present Illness, Review of Systems and Physical Exam were personally obtained by  me. Portions of this information were initially documented by Ashley Royalty, CMA and reviewed by me for thoroughness and accuracy.        Trinna Post, PA-C  [facility]

## 2016-07-06 NOTE — Patient Instructions (Signed)

## 2016-07-14 ENCOUNTER — Encounter: Payer: Self-pay | Admitting: Physician Assistant

## 2016-07-14 ENCOUNTER — Ambulatory Visit (INDEPENDENT_AMBULATORY_CARE_PROVIDER_SITE_OTHER): Payer: BLUE CROSS/BLUE SHIELD | Admitting: Physician Assistant

## 2016-07-14 VITALS — BP 104/74 | HR 80 | Temp 98.7°F | Resp 16 | Ht 63.0 in | Wt 164.2 lb

## 2016-07-14 DIAGNOSIS — Z713 Dietary counseling and surveillance: Secondary | ICD-10-CM | POA: Diagnosis not present

## 2016-07-14 DIAGNOSIS — Z6831 Body mass index (BMI) 31.0-31.9, adult: Secondary | ICD-10-CM

## 2016-07-14 DIAGNOSIS — E6609 Other obesity due to excess calories: Secondary | ICD-10-CM | POA: Diagnosis not present

## 2016-07-14 DIAGNOSIS — F17200 Nicotine dependence, unspecified, uncomplicated: Secondary | ICD-10-CM | POA: Diagnosis not present

## 2016-07-14 DIAGNOSIS — E119 Type 2 diabetes mellitus without complications: Secondary | ICD-10-CM

## 2016-07-14 LAB — POCT GLYCOSYLATED HEMOGLOBIN (HGB A1C)
ESTIMATED AVERAGE GLUCOSE: 174
HEMOGLOBIN A1C: 7.7

## 2016-07-14 MED ORDER — PHENTERMINE HCL 37.5 MG PO TABS: 37.5000 mg | ORAL_TABLET | Freq: Every day | ORAL | 0 refills | Status: DC

## 2016-07-14 MED ORDER — METFORMIN HCL 500 MG PO TABS: ORAL_TABLET | ORAL | 1 refills | Status: DC

## 2016-07-14 NOTE — Progress Notes (Signed)
Patient: Bethany Edwards Female    DOB: 1981/03/31   36 y.o.   MRN: [id number] Visit Date: 07/14/2016  Today's Provider: Margaretann Loveless, PA-C   Chief Complaint  Patient presents with  . Follow-up  . Diabetes   Subjective:    HPI Patient here today to follow-up on weight. Patient last seen on 03/31/16 and was started on phentermine 37.5 mg. Patient reports good tolerance and compliance. Patient reports she is active with daily activities and reports a healthy diet.   Current Exercise Habits: The patient has a physically strenous job, but has no regular exercise apart from work.    Wt Readings from Last 3 Encounters:  07/14/16 164 lb 3.2 oz (74.5 kg)  07/06/16 162 lb (73.5 kg)  03/31/16 177 lb (80.3 kg)      Diabetes Mellitus Type II, Follow-up:   Lab Results  Component Value Date   HGBA1C 7.7 07/14/2016   HGBA1C 8.1 03/31/2016   HGBA1C 7.7 (H) 12/02/2015    Last seen for diabetes 3 months ago.  Management since then includes adding Jardiance. She reports excellent compliance with treatment. She is not having side effects.  Current symptoms include none and have been stable. Home blood sugar records: fasting range: 130's  Episodes of hypoglycemia? no  Most Recent Eye Exam: not up to date Weight trend: stable Prior visit with dietician: no Current diet: in general, a "healthy" diet   Current exercise: walking  Pertinent Labs:    Component Value Date/Time   CHOL 169 12/02/2015 0940   TRIG 237 (H) 12/02/2015 0940   HDL 29 (L) 12/02/2015 0940   LDLCALC 93 12/02/2015 0940   CREATININE 0.76 12/02/2015 0940   ------------------------------------------------------------------------   Follow up for compulsive tobacco user  The patient was last seen for this 3 months ago. Changes made at last visit include no changes patient to continue Wellbutrin.  She reports fair compliance with treatment. She feels that condition is Unchanged. She is not having  side effects.   She did try an e-cig with no nicotine and this helped. She went 4 days without a cigarette. ------------------------------------------------------------------------------------ She has been under some increased stress over the last couple of weeks. Her 86 yr old daughter had to have surgery last week for breast asymmetry and they are closing on their house next week. She has been smoking with the stress but is still not smoking as much as she used to.     Allergies  Allergen Reactions  . Penicillins Rash    Can take Cephalosporin     Current Outpatient Prescriptions:  .  albuterol (PROVENTIL HFA;VENTOLIN HFA) 108 (90 Base) MCG/ACT inhaler, Inhale 2 puffs into the lungs every 6 (six) hours as needed for wheezing or shortness of breath., Disp: 1 Inhaler, Rfl: 0 .  atorvastatin (LIPITOR) 10 MG tablet, Take 1 tablet (10 mg total) by mouth daily., Disp: 30 tablet, Rfl: 5 .  buPROPion (WELLBUTRIN SR) 150 MG 12 hr tablet, Take 1 tablet (150 mg total) by mouth 2 (two) times daily., Disp: 60 tablet, Rfl: 5 .  empagliflozin (JARDIANCE) 25 MG TABS tablet, Take 25 mg by mouth daily., Disp: 30 tablet, Rfl: 5 .  furosemide (LASIX) 20 MG tablet, Take 1 tablet (20 mg total) by mouth daily., Disp: 30 tablet, Rfl: 5 .  metFORMIN (GLUCOPHAGE) 500 MG tablet, TAKE 1 TABLET (500 MG TOTAL) BY MOUTH 2 (TWO) TIMES DAILY WITH A MEAL., Disp: 180 tablet, Rfl: 1 .  phentermine (ADIPEX-P) 37.5 MG tablet, Take 1 tablet (37.5 mg total) by mouth daily before breakfast., Disp: 30 tablet, Rfl: 0  Review of Systems  Constitutional: Negative.   Respiratory: Negative.   Cardiovascular: Negative.   Gastrointestinal: Negative.   Endocrine: Negative.   Neurological: Negative.   Psychiatric/Behavioral: Negative.     Social History  Substance Use Topics  . Smoking status: Current Every Day Smoker    Packs/day: 0.50  . Smokeless tobacco: Never Used  . Alcohol use No   Objective:   BP 104/74 (BP  Location: Left Arm, Patient Position: Sitting, Cuff Size: Large)   Pulse 80   Temp 98.7 F (37.1 C) (Oral)   Resp 16   Ht 5\' 3"  (1.6 m)   Wt 164 lb 3.2 oz (74.5 kg)   LMP 06/27/2016   SpO2 99%   BMI 29.09 kg/m  Vitals:   07/14/16 0808  BP: 104/74  Pulse: 80  Resp: 16  Temp: 98.7 F (37.1 C)  TempSrc: Oral  SpO2: 99%  Weight: 164 lb 3.2 oz (74.5 kg)  Height: 5\' 3"  (1.6 m)     Physical Exam  Constitutional: She appears well-developed and well-nourished. No distress.  Neck: Normal range of motion. Neck supple. No tracheal deviation present. No thyromegaly present.  Cardiovascular: Normal rate, regular rhythm and normal heart sounds.  Exam reveals no gallop and no friction rub.   No murmur heard. Pulmonary/Chest: Effort normal and breath sounds normal. No respiratory distress. She has no wheezes. She has no rales.  Musculoskeletal: She exhibits no edema.  Lymphadenopathy:    She has no cervical adenopathy.  Skin: She is not diaphoretic.  Psychiatric: She has a normal mood and affect. Her behavior is normal. Judgment and thought content normal.  Vitals reviewed.     Assessment & Plan:     1. Encounter for weight loss counseling Doing well and has lost 15 pounds since starting. She only did one month supply of phentermine and maintained changes in lifestyle to keep weight off over the last 2 months. She is walking more. Discussed doing home exercise routines to add resistance training. I will see her back in 3 months to recheck progress. - phentermine (ADIPEX-P) 37.5 MG tablet; Take 1 tablet (37.5 mg total) by mouth daily before breakfast.  Dispense: 30 tablet; Refill: 0  2. Class 1 obesity due to excess calories with serious comorbidity and body mass index (BMI) of 31.0 to 31.9 in adult See above medical treatment plan. - phentermine (ADIPEX-P) 37.5 MG tablet; Take 1 tablet (37.5 mg total) by mouth daily before breakfast.  Dispense: 30 tablet; Refill: 0  3. Type 2 diabetes  mellitus without complication, unspecified long term insulin use status (HCC) A1c 7.7 today which is improved from 8.1. Continue healthy lifestyle modifications. Continue metformin 500mg  BID and Jardiance 25mg  daily. We will recheck in 3 months. - POCT glycosylated hemoglobin (Hb A1C) - metFORMIN (GLUCOPHAGE) 500 MG tablet; TAKE 1 TABLET (500 MG TOTAL) BY MOUTH 2 (TWO) TIMES DAILY WITH A MEAL.  Dispense: 180 tablet; Refill: 1  4. Compulsive tobacco user syndrome She is going to continue working on smoking cessation. She is continuing wellbutrin and discussed use of e cigarettes.        Margaretann LovelessJennifer M Burnette, PA-C  [facility]

## 2016-07-14 NOTE — Patient Instructions (Signed)
Steps to Quit Smoking Smoking tobacco can be harmful to your health and can affect almost every organ in your body. Smoking puts you, and those around you, at risk for developing many serious chronic diseases. Quitting smoking is difficult, but it is one of the best things that you can do for your health. It is never too late to quit. What are the benefits of quitting smoking? When you quit smoking, you lower your risk of developing serious diseases and conditions, such as:  Lung cancer or lung disease, such as COPD.  Heart disease.  Stroke.  Heart attack.  Infertility.  Osteoporosis and bone fractures.  Additionally, symptoms such as coughing, wheezing, and shortness of breath may get better when you quit. You may also find that you get sick less often because your body is stronger at fighting off colds and infections. If you are pregnant, quitting smoking can help to reduce your chances of having a baby of low birth weight. How do I get ready to quit? When you decide to quit smoking, create a plan to make sure that you are successful. Before you quit:  Pick a date to quit. Set a date within the next two weeks to give you time to prepare.  Write down the reasons why you are quitting. Keep this list in places where you will see it often, such as on your bathroom mirror or in your car or wallet.  Identify the people, places, things, and activities that make you want to smoke (triggers) and avoid them. Make sure to take these actions: ? Throw away all cigarettes at home, at work, and in your car. ? Throw away smoking accessories, such as ashtrays and lighters. ? Clean your car and make sure to empty the ashtray. ? Clean your home, including curtains and carpets.  Tell your family, friends, and coworkers that you are quitting. Support from your loved ones can make quitting easier.  Talk with your health care provider about your options for quitting smoking.  Find out what treatment  options are covered by your health insurance.  What strategies can I use to quit smoking? Talk with your healthcare provider about different strategies to quit smoking. Some strategies include:  Quitting smoking altogether instead of gradually lessening how much you smoke over a period of time. Research shows that quitting "cold turkey" is more successful than gradually quitting.  Attending in-person counseling to help you build problem-solving skills. You are more likely to have success in quitting if you attend several counseling sessions. Even short sessions of 10 minutes can be effective.  Finding resources and support systems that can help you to quit smoking and remain smoke-free after you quit. These resources are most helpful when you use them often. They can include: ? Online chats with a counselor. ? Telephone quitlines. ? Printed self-help materials. ? Support groups or group counseling. ? Text messaging programs. ? Mobile phone applications.  Taking medicines to help you quit smoking. (If you are pregnant or breastfeeding, talk with your health care provider first.) Some medicines contain nicotine and some do not. Both types of medicines help with cravings, but the medicines that include nicotine help to relieve withdrawal symptoms. Your health care provider may recommend: ? Nicotine patches, gum, or lozenges. ? Nicotine inhalers or sprays. ? Non-nicotine medicine that is taken by mouth.  Talk with your health care provider about combining strategies, such as taking medicines while you are also receiving in-person counseling. Using these two strategies together   makes you more likely to succeed in quitting than if you used either strategy on its own. If you are pregnant or breastfeeding, talk with your health care provider about finding counseling or other support strategies to quit smoking. Do not take medicine to help you quit smoking unless told to do so by your health care  provider. What things can I do to make it easier to quit? Quitting smoking might feel overwhelming at first, but there is a lot that you can do to make it easier. Take these important actions:  Reach out to your family and friends and ask that they support and encourage you during this time. Call telephone quitlines, reach out to support groups, or work with a counselor for support.  Ask people who smoke to avoid smoking around you.  Avoid places that trigger you to smoke, such as bars, parties, or smoke-break areas at work.  Spend time around people who do not smoke.  Lessen stress in your life, because stress can be a smoking trigger for some people. To lessen stress, try: ? Exercising regularly. ? Deep-breathing exercises. ? Yoga. ? Meditating. ? Performing a body scan. This involves closing your eyes, scanning your body from head to toe, and noticing which parts of your body are particularly tense. Purposefully relax the muscles in those areas.  Download or purchase mobile phone or tablet apps (applications) that can help you stick to your quit plan by providing reminders, tips, and encouragement. There are many free apps, such as QuitGuide from the CDC (Centers for Disease Control and Prevention). You can find other support for quitting smoking (smoking cessation) through smokefree.gov and other websites.  How will I feel when I quit smoking? Within the first 24 hours of quitting smoking, you may start to feel some withdrawal symptoms. These symptoms are usually most noticeable 2-3 days after quitting, but they usually do not last beyond 2-3 weeks. Changes or symptoms that you might experience include:  Mood swings.  Restlessness, anxiety, or irritation.  Difficulty concentrating.  Dizziness.  Strong cravings for sugary foods in addition to nicotine.  Mild weight gain.  Constipation.  Nausea.  Coughing or a sore throat.  Changes in how your medicines work in your  body.  A depressed mood.  Difficulty sleeping (insomnia).  After the first 2-3 weeks of quitting, you may start to notice more positive results, such as:  Improved sense of smell and taste.  Decreased coughing and sore throat.  Slower heart rate.  Lower blood pressure.  Clearer skin.  The ability to breathe more easily.  Fewer sick days.  Quitting smoking is very challenging for most people. Do not get discouraged if you are not successful the first time. Some people need to make many attempts to quit before they achieve long-term success. Do your best to stick to your quit plan, and talk with your health care provider if you have any questions or concerns. This information is not intended to replace advice given to you by your health care provider. Make sure you discuss any questions you have with your health care provider. Document Released: 03/29/2001 Document Revised: 12/01/2015 Document Reviewed: 08/19/2014 Elsevier Interactive Patient Education  2017 [employer]  

## 2016-08-10 ENCOUNTER — Ambulatory Visit (INDEPENDENT_AMBULATORY_CARE_PROVIDER_SITE_OTHER): Payer: BLUE CROSS/BLUE SHIELD | Admitting: Physician Assistant

## 2016-08-10 ENCOUNTER — Encounter: Payer: Self-pay | Admitting: Physician Assistant

## 2016-08-10 VITALS — BP 122/70 | HR 90 | Temp 99.1°F | Resp 16 | Wt 161.0 lb

## 2016-08-10 DIAGNOSIS — E6609 Other obesity due to excess calories: Secondary | ICD-10-CM

## 2016-08-10 DIAGNOSIS — Z713 Dietary counseling and surveillance: Secondary | ICD-10-CM

## 2016-08-10 DIAGNOSIS — Z6831 Body mass index (BMI) 31.0-31.9, adult: Secondary | ICD-10-CM

## 2016-08-10 DIAGNOSIS — R102 Pelvic and perineal pain: Secondary | ICD-10-CM | POA: Diagnosis not present

## 2016-08-10 DIAGNOSIS — Z124 Encounter for screening for malignant neoplasm of cervix: Secondary | ICD-10-CM

## 2016-08-10 DIAGNOSIS — N941 Unspecified dyspareunia: Secondary | ICD-10-CM | POA: Diagnosis not present

## 2016-08-10 MED ORDER — PHENTERMINE HCL 37.5 MG PO TABS: 37.5000 mg | ORAL_TABLET | Freq: Every day | ORAL | 3 refills | Status: DC

## 2016-08-10 NOTE — Patient Instructions (Signed)
Dyspareunia, Female Dyspareunia is pain that is associated with sexual activity. This can affect any part of the genitals or lower abdomen, and there are many possible causes. This condition ranges from mild to severe. Depending on the cause, dyspareunia may get better with treatment, or it may return (recur) over time. What are the causes? The cause of this condition is not always known. Possible causes include:  Cancer.  Psychological factors, such as depression, anxiety, or previous traumatic experiences.  Severe pain and tenderness of the skin around the vagina (vulva) when it is touched (vulvar vestibulitis syndrome).  Infection of the pelvis or the vulva.  Infection of the vagina.  Painful, involuntary tightening (contraction) of the vaginal muscles when anything is put inside the vagina (vaginismus).  Allergic reaction.  Ovarian cysts.  Solid growths of tissue (tumors) in the ovaries or the uterus.  Scar tissue in the ovaries, vagina, or pelvis.  Vaginal dryness.  Thinning of the tissue (atrophy) of the vulva and vagina.  Skin conditions that affect the vulva (vulvar dermatoses), such as lichen sclerosus or lichen planus.  Endometriosis.  Tubal pregnancy.  A tilted uterus.  Uterine prolapse.  Adhesions in the vagina.  Bladder problems.  Intestinal problems.  Certain medicines.  Medical conditions such as diabetes, arthritis, or thyroid disease. What increases the risk? The following factors may make you more likely to develop this condition:  Having experienced physical or sexual trauma.  Having given birth more than once.  Taking birth control pills.  Having gone through menopause.  Having recently given birth, typically within the past 3-6 months.  Breastfeeding. What are the signs or symptoms? The main symptom of this condition is pain in any part of the genitals or lower abdomen during or after sexual activity. This may include pain during  sexual arousal, genital stimulation, or orgasm. Pain may get worse when anything is inserted into the vagina, or when the genitals are touched in any way, such as when sitting or wearing pants. Pain can range from mild to severe, depending on the cause of the condition. In some cases, symptoms go away with treatment and return (recur) at a later date. How is this diagnosed? This condition may be diagnosed based on:  Your symptoms, including:  Where your pain is located.  When your pain occurs.  Your medical history.  A physical exam. This may include a pelvic exam and a Pap test. This is a screening test that is used to check for signs of cancer of the vagina, cervix, and uterus.  Tests, including:  Blood tests.  Ultrasound. This uses sound waves to make a picture of the area that is being tested.  Urine culture. This test involves checking a urine sample for signs of infection.  Culture test. This is when your health care provider uses a swab to collect a sample of vaginal fluid. The sample is checked for signs of infection.  X-rays.  MRI.  CT scan.  Laparoscopy. This is a procedure in which a small incision is made in your lower abdomen and a lighted, pencil-sized instrument (laparoscope) is passed through the incision and used to look inside your pelvis. You may be referred to a health care provider who specializes in women's health (gynecologist). In some cases, diagnosing the cause of dyspareunia can be difficult. How is this treated? Treatment depends on the cause of your condition and your symptoms. In most cases, you may need to stop sexual activity until your symptoms improve. Treatment may   include:  Lubricants.  Kegel exercises or vaginal dilators.  Medicated skin creams.  Medicated vaginal creams.  Hormonal therapy.  Antibiotic medicine to prevent or fight infection.  Medicines that help to relieve pain.  Medicines that treat depression  (antidepressants).  Psychological counseling.  Sex therapy.  Surgery. Follow these instructions at home: Lifestyle   Avoid tight clothing and irritating materials around your genital and abdominal area.  Use water-based lubricants as needed. Avoid oil-based lubricants.  Do not use any products that irritate you. This may include certain condoms, spermicides, lubricants, soaps, tampons, vaginal sprays, or douches.  Always practice safe sex. Talk with your health care provider about which form of birth control (contraception) is best for you.  Maintain open communication with your sexual partner. General instructions   Take over-the-counter and prescription medicines only as told by your health care provider.  If you had tests done, it is your responsibility to get your tests results. Ask your health care provider or the department performing the test when your results will be ready.  Urinate before you engage in sexual activity.  Consider joining a support group.  Keep all follow-up visits as told by your health care provider. This is important. Contact a health care provider if:  You develop vaginal bleeding after sexual intercourse.  You develop a lump at the opening of your vagina. Seek medical care even if the lump is painless.  You have:  Abnormal vaginal discharge.  Vaginal dryness.  Itchiness or irritation of your vulva or vagina.  A new rash.  Symptoms that get worse or do not improve with treatment.  A fever.  Pain when you urinate.  Blood in your urine. Get help right away if:  You develop severe pain in your abdomen during or shortly after sexual intercourse.  You pass out after having sexual intercourse. This information is not intended to replace advice given to you by your health care provider. Make sure you discuss any questions you have with your health care provider. Document Released: 04/24/2007 Document Revised: 08/14/2015 Document  Reviewed: 11/04/2014 Elsevier Interactive Patient Education  2017 Elsevier Inc.  

## 2016-08-10 NOTE — Progress Notes (Addendum)
Patient: Bethany Edwards Female    DOB: 10-25-1980   36 y.o.   MRN: [id number] Visit Date: 08/10/2016  Today's Provider: Margaretann Loveless, PA-C   Chief Complaint  Patient presents with  . Pelvic Pain   Subjective:    Pelvic Pain  The patient's primary symptoms include pelvic pain. The patient's pertinent negatives include no vaginal discharge. This is a new problem. The current episode started 1 to 4 weeks ago (about 2 weeks). The problem has been gradually worsening. The pain is moderate (to severe). The problem affects the right side. She is not pregnant. Associated symptoms include abdominal pain. Pertinent negatives include no back pain, constipation, diarrhea, dysuria, fever, flank pain, frequency or hematuria. The symptoms are aggravated by intercourse. She has tried nothing for the symptoms. She is sexually active. No, her partner does not have an STD. She uses tubal ligation for contraception. Her menstrual history has been regular. Her past medical history is significant for ovarian cysts.      Allergies  Allergen Reactions  . Penicillins Rash    Can take Cephalosporin     Current Outpatient Prescriptions:  .  albuterol (PROVENTIL HFA;VENTOLIN HFA) 108 (90 Base) MCG/ACT inhaler, Inhale 2 puffs into the lungs every 6 (six) hours as needed for wheezing or shortness of breath., Disp: 1 Inhaler, Rfl: 0 .  atorvastatin (LIPITOR) 10 MG tablet, Take 1 tablet (10 mg total) by mouth daily., Disp: 30 tablet, Rfl: 5 .  buPROPion (WELLBUTRIN SR) 150 MG 12 hr tablet, Take 1 tablet (150 mg total) by mouth 2 (two) times daily., Disp: 60 tablet, Rfl: 5 .  empagliflozin (JARDIANCE) 25 MG TABS tablet, Take 25 mg by mouth daily., Disp: 30 tablet, Rfl: 5 .  furosemide (LASIX) 20 MG tablet, Take 1 tablet (20 mg total) by mouth daily., Disp: 30 tablet, Rfl: 5 .  metFORMIN (GLUCOPHAGE) 500 MG tablet, TAKE 1 TABLET (500 MG TOTAL) BY MOUTH 2 (TWO) TIMES DAILY WITH A MEAL., Disp: 180 tablet,  Rfl: 1 .  phentermine (ADIPEX-P) 37.5 MG tablet, Take 1 tablet (37.5 mg total) by mouth daily before breakfast., Disp: 30 tablet, Rfl: 0  Review of Systems  Constitutional: Negative for fever.  Respiratory: Negative.   Cardiovascular: Negative.   Gastrointestinal: Positive for abdominal pain. Negative for constipation and diarrhea.  Genitourinary: Positive for dyspareunia and pelvic pain. Negative for dysuria, flank pain, frequency, hematuria, vaginal bleeding, vaginal discharge and vaginal pain.  Musculoskeletal: Negative for back pain.    Social History  Substance Use Topics  . Smoking status: Current Every Day Smoker    Packs/day: 0.50  . Smokeless tobacco: Never Used  . Alcohol use No   Objective:   BP 122/70 (BP Location: Left Arm, Patient Position: Sitting, Cuff Size: Normal)   Pulse 90   Temp 99.1 F (37.3 C)   Resp 16   Wt 161 lb (73 kg)   SpO2 99%   BMI 28.52 kg/m  Vitals:   08/10/16 1454  BP: 122/70  Pulse: 90  Resp: 16  Temp: 99.1 F (37.3 C)  SpO2: 99%  Weight: 161 lb (73 kg)     Physical Exam  Constitutional: She appears well-developed and well-nourished. No distress.  Neck: Normal range of motion. Neck supple. No JVD present. No tracheal deviation present. No thyromegaly present.  Cardiovascular: Normal rate, regular rhythm and normal heart sounds.  Exam reveals no gallop and no friction rub.   No murmur heard. Pulmonary/Chest: Effort  normal and breath sounds normal. No respiratory distress. She has no wheezes. She has no rales.  Abdominal: Soft. Bowel sounds are normal. She exhibits no distension and no mass. There is no tenderness. There is no rebound, no guarding and no CVA tenderness.  Genitourinary: Vagina normal and uterus normal. There is no rash, tenderness, lesion or injury on the right labia. There is no rash, tenderness, lesion or injury on the left labia. Cervix exhibits discharge (scant, thin, white, milky discharge. No odor). Cervix exhibits  no motion tenderness and no friability. Right adnexum displays no mass, no tenderness and no fullness. Left adnexum displays no mass, no tenderness and no fullness. No erythema in the vagina. No vaginal discharge found.  Lymphadenopathy:    She has no cervical adenopathy.       Right: No inguinal adenopathy present.       Left: No inguinal adenopathy present.  Skin: She is not diaphoretic.  Vitals reviewed.     Assessment & Plan:     1. Pelvic pain Unknown cause. UA negative, urine pregnancy negative. Possible right ovarian cyst, has had right ovarian cyst before when pregnant with her son. Pap collected today and will f/u pending results. Will get pelvic and transvaginal US to check for ovarian cyst and/or R/O any other cause. Discussed using lubrication for intercourse. I will f/u pending results. She is to call if symptoms worsen or change in the meantime.  - US Pelvis Complete; Future - US Transvaginal Non-OB; Future - Pap IG and HPV (high risk) DNA detection  2. Dyspareunia in female See above medical treatment plan. - US Pelvis Complete; Future - US Transvaginal Non-OB; Future - Pap IG and HPV (high risk) DNA detection  3. Encounter for Papanicolaou smear for cervical cancer screening - Pap IG and HPV (high risk) DNA detection  4. Encounter for weight loss counseling Doing well with weight loss and has lost another 4-5 pounds. WIll continue phentermine as below. I will see her back in 3 months for weight recheck.  - phentermine (ADIPEX-P) 37.5 MG tablet; Take 1 tablet (37.5 mg total) by mouth daily before breakfast.  Dispense: 30 tablet; Refill: 3  5. Class 1 obesity due to excess calories with serious comorbidity and body mass index (BMI) of 31.0 to 31.9 in adult See above medical treatment plan. - phentermine (ADIPEX-P) 37.5 MG tablet; Take 1 tablet (37.5 mg total) by mouth daily before breakfast.  Dispense: 30 tablet; Refill: 3       Margaretann Loveless, PA-C    [facility]

## 2016-08-11 LAB — POCT URINALYSIS DIPSTICK
BILIRUBIN UA: NEGATIVE
GLUCOSE UA: 2000
KETONES UA: NEGATIVE
LEUKOCYTES UA: NEGATIVE
NITRITE UA: NEGATIVE
Protein, UA: NEGATIVE
Spec Grav, UA: 1.02 (ref 1.010–1.025)
Urobilinogen, UA: 0.2 E.U./dL
pH, UA: 5 (ref 5.0–8.0)

## 2016-08-11 LAB — POCT URINE PREGNANCY: Preg Test, Ur: NEGATIVE

## 2016-08-11 NOTE — Addendum Note (Signed)
Addended by: Margaretann Loveless on: 08/11/2016 12:58 PM   Modules accepted: Orders

## 2016-08-12 LAB — PAP IG AND HPV HIGH-RISK
HPV, HIGH-RISK: NEGATIVE
PAP Smear Comment: 0

## 2016-08-15 ENCOUNTER — Telehealth: Payer: Self-pay

## 2016-08-15 ENCOUNTER — Ambulatory Visit
Admission: RE | Admit: 2016-08-15 | Discharge: 2016-08-15 | Disposition: A | Discharge status: Home / Self Care | Payer: BLUE CROSS/BLUE SHIELD | Source: Ambulatory Visit | Attending: Physician Assistant | Admitting: Physician Assistant

## 2016-08-15 DIAGNOSIS — N941 Unspecified dyspareunia: Secondary | ICD-10-CM | POA: Insufficient documentation

## 2016-08-15 DIAGNOSIS — R1031 Right lower quadrant pain: Secondary | ICD-10-CM | POA: Diagnosis not present

## 2016-08-15 DIAGNOSIS — R102 Pelvic and perineal pain: Secondary | ICD-10-CM | POA: Diagnosis not present

## 2016-08-15 NOTE — Telephone Encounter (Signed)
Advised pt of lab results. Pt verbally acknowledges understanding. Kimberlye Dilger Drozdowski, CMA   

## 2016-08-15 NOTE — Telephone Encounter (Signed)
-----   Message from Margaretann Loveless, [place] sent at 08/15/2016  8:45 AM EDT ----- Pap was normal and HPV negative.

## 2016-08-16 ENCOUNTER — Other Ambulatory Visit: Payer: Self-pay

## 2016-08-16 NOTE — Telephone Encounter (Signed)
-----   Message from Margaretann Loveless, [place] sent at 08/16/2016 10:43 AM EDT ----- Would recommend GYN referral most likely, could get xray to make sure not constipation causing increased pressure if she has had any bowel changes.

## 2016-08-16 NOTE — Telephone Encounter (Signed)
Spoke with patient on phone and advised as below, she states it is whatever direction you would prefer her to go towards that you think would be best. Patient states that she has been dealing with constipation her whole life and usually only passes a bowel movement 2-3x a month, patient denies having a G.I doctor or being seen by one in past. Patient wants to let you know that her pain has decreased but stays constant. I asked patient when was the last time she passed bowel movement and she states that it was yesterday and it was normal. Bethany Edwards

## 2016-08-17 MED ORDER — METFORMIN HCL 500 MG PO TABS: ORAL_TABLET | ORAL | 1 refills | Status: DC

## 2016-08-17 NOTE — Telephone Encounter (Signed)
See if patient notices any change in symptoms with intercourse after she has a BM? If so then that may be the cause, if not will send to GYN for further evaluation.

## 2016-08-17 NOTE — Telephone Encounter (Signed)
She reports that she wants to wait 2 more week after her period. She reports that she starts her period next week Pain is much better, mild. She reports that after the 2 weeks she will give [place] a call if she needs to proceed with the GYN referral.  She is also requesting a prescription for Metormin 500 Mg to be send to [place].

## 2016-09-20 ENCOUNTER — Other Ambulatory Visit: Payer: Self-pay | Admitting: Physician Assistant

## 2016-09-20 DIAGNOSIS — E6609 Other obesity due to excess calories: Secondary | ICD-10-CM

## 2016-09-20 DIAGNOSIS — Z713 Dietary counseling and surveillance: Secondary | ICD-10-CM

## 2016-09-20 DIAGNOSIS — Z6831 Body mass index (BMI) 31.0-31.9, adult: Secondary | ICD-10-CM

## 2016-09-20 MED ORDER — PHENTERMINE HCL 37.5 MG PO TABS: 37.5000 mg | ORAL_TABLET | Freq: Every day | ORAL | 2 refills | Status: DC

## 2016-09-20 NOTE — Telephone Encounter (Signed)
rx called in and pt advised-aa 

## 2016-09-20 NOTE — Telephone Encounter (Signed)
Pt contacted office for refill request on the following medications:  phentermine (ADIPEX-P) 37.5 MG tablet.  ZO#[phone]/WJB#[phone]/MW  Pt states she has accidentally shredded the Rx/MW

## 2016-10-09 ENCOUNTER — Other Ambulatory Visit: Payer: Self-pay | Admitting: Physician Assistant

## 2016-10-09 DIAGNOSIS — E119 Type 2 diabetes mellitus without complications: Secondary | ICD-10-CM

## 2016-10-10 NOTE — Telephone Encounter (Signed)
Last ov 08/10/16 Last filled 03/31/16 Please review. Thank you. sd

## 2016-10-14 ENCOUNTER — Ambulatory Visit (INDEPENDENT_AMBULATORY_CARE_PROVIDER_SITE_OTHER): Payer: BLUE CROSS/BLUE SHIELD | Admitting: Physician Assistant

## 2016-10-14 ENCOUNTER — Encounter: Payer: Self-pay | Admitting: Physician Assistant

## 2016-10-14 VITALS — BP 120/70 | HR 96 | Temp 98.2°F | Resp 16 | Wt 159.6 lb

## 2016-10-14 DIAGNOSIS — E119 Type 2 diabetes mellitus without complications: Secondary | ICD-10-CM

## 2016-10-14 DIAGNOSIS — B379 Candidiasis, unspecified: Secondary | ICD-10-CM | POA: Diagnosis not present

## 2016-10-14 DIAGNOSIS — L659 Nonscarring hair loss, unspecified: Secondary | ICD-10-CM | POA: Diagnosis not present

## 2016-10-14 LAB — POCT GLYCOSYLATED HEMOGLOBIN (HGB A1C)
ESTIMATED AVERAGE GLUCOSE: 166
HEMOGLOBIN A1C: 7.4

## 2016-10-14 MED ORDER — GLIPIZIDE ER 10 MG PO TB24: 10.0000 mg | ORAL_TABLET | Freq: Every day | ORAL | 1 refills | Status: DC

## 2016-10-14 MED ORDER — FLUCONAZOLE 150 MG PO TABS: 150.0000 mg | ORAL_TABLET | Freq: Once | ORAL | 0 refills | Status: AC

## 2016-10-14 NOTE — Patient Instructions (Signed)
Type 2 Diabetes Mellitus, Diagnosis, Adult Type 2 diabetes (type 2 diabetes mellitus) is a long-term (chronic) disease. It may be caused by one or both of these problems:  Your body does not make enough of a hormone called insulin.  Your body does not react in a normal way to insulin that it makes.  Insulin lets sugars (glucose) go into cells in the body. This gives you energy. If you have type 2 diabetes, sugars cannot get into cells. This causes high blood sugar (hyperglycemia). Your doctor will set treatment goals for you. Generally, you should have these blood sugar levels:  Before meals (preprandial): 80-130 mg/dL (4.4-7.2 mmol/L).  After meals (postprandial): below 180 mg/dL (10 mmol/L).  A1c (hemoglobin A1c) level: less than 7%.  Follow these instructions at home: Questions to Ask Your Doctor  You may want to ask these questions:  Do I need to meet with a diabetes educator?  Where can I find a support group for people with diabetes?  What equipment will I need to care for myself at home?  What diabetes medicines do I need? When should I take them?  How often do I need to check my blood sugar?  What number can I call if I have questions?  When is my next doctor's visit?  General instructions  Take over-the-counter and prescription medicines only as told by your doctor.  Keep all follow-up visits as told by your doctor. This is important. Contact a doctor if:  Your blood sugar is at or above 240 mg/dL (13.3 mmol/L) for 2 days in a row.  You have been sick or have had a fever for 2 days or more and you are not getting better.  You have any of these problems for more than 6 hours: ? You cannot eat or drink. ? You feel sick to your stomach (nauseous). ? You throw up (vomit). ? You have watery poop (diarrhea). Get help right away if:  Your blood sugar is lower than 54 mg/dL (3 mmol/L).  You get confused.  You have trouble: ? Thinking  clearly. ? Breathing.  You have moderate or large ketone levels in your pee (urine). This information is not intended to replace advice given to you by your health care provider. Make sure you discuss any questions you have with your health care provider. Document Released: 01/12/2008 Document Revised: 09/10/2015 Document Reviewed: 05/08/2015 Elsevier Interactive Patient Education  2018 Elsevier Inc.   

## 2016-10-14 NOTE — Progress Notes (Signed)
Patient: Bethany Edwards Female    DOB: 09/02/1980   36 y.o.   MRN: [id number] Visit Date: 10/14/2016  Today's Provider: Margaretann LovelessJennifer M Aidian Salomon, PA-C   Chief Complaint  Patient presents with  . Follow-up    Diabetes Mellitus Type 2   Subjective:    HPI  Diabetes Mellitus Type II, Follow-up:   Lab Results  Component Value Date   HGBA1C 7.4 10/14/2016   HGBA1C 7.7 07/14/2016   HGBA1C 8.1 03/31/2016    Last seen for diabetes 3 months ago.  Management since then includes none. She reports excellent compliance with treatment. She is not having side effects. She reports that she want to get off the Jardiance and if possible get back on Glipizide. She reports the [place] PepperJardiance is causing yeast infection and bad odor to urine and vaginal discharge. Current symptoms include none and have been stable. Home blood sugar records: fasting range: 120-200  Episodes of hypoglycemia? no   Current  Medication Most Recent Eye Exam: Due Weight trend: decreasing steadily Current diet: in general, a "healthy" diet   Current exercise: patient reports she is active with daily activities and walking more.  Pertinent Labs:    Component Value Date/Time   CHOL 169 12/02/2015 0940   TRIG 237 (H) 12/02/2015 0940   HDL 29 (L) 12/02/2015 0940   LDLCALC 93 12/02/2015 0940   CREATININE 0.76 12/02/2015 0940    Wt Readings from Last 3 Encounters:  10/14/16 159 lb 9.6 oz (72.4 kg)  08/10/16 161 lb (73 kg)  07/14/16 164 lb 3.2 oz (74.5 kg)    ------------------------------------------------------------------------ She also has c/o hair falling out and having hot flashes, would like labs.    Allergies  Allergen Reactions  . Penicillins Rash    Can take Cephalosporin     Current Outpatient Prescriptions:  .  albuterol (PROVENTIL HFA;VENTOLIN HFA) 108 (90 Base) MCG/ACT inhaler, Inhale 2 puffs into the lungs every 6 (six) hours as needed for wheezing or shortness of breath., Disp: 1 Inhaler,  Rfl: 0 .  atorvastatin (LIPITOR) 10 MG tablet, Take 1 tablet (10 mg total) by mouth daily., Disp: 30 tablet, Rfl: 5 .  buPROPion (WELLBUTRIN SR) 150 MG 12 hr tablet, Take 1 tablet (150 mg total) by mouth 2 (two) times daily., Disp: 60 tablet, Rfl: 5 .  furosemide (LASIX) 20 MG tablet, Take 1 tablet (20 mg total) by mouth daily., Disp: 30 tablet, Rfl: 5 .  JARDIANCE 25 MG TABS tablet, TAKE 1 TABLET BY MOUTH EVERY DAY, Disp: 30 tablet, Rfl: 5 .  metFORMIN (GLUCOPHAGE) 500 MG tablet, TAKE 1 TABLET (500 MG TOTAL) BY MOUTH 2 (TWO) TIMES DAILY WITH A MEAL., Disp: 180 tablet, Rfl: 1 .  phentermine (ADIPEX-P) 37.5 MG tablet, Take 1 tablet (37.5 mg total) by mouth daily before breakfast., Disp: 30 tablet, Rfl: 2  Review of Systems  Constitutional: Negative.   Respiratory: Negative.   Cardiovascular: Negative.   Endocrine: Positive for heat intolerance. Negative for cold intolerance, polydipsia, polyphagia and polyuria.  Genitourinary: Positive for vaginal discharge (thick, white).    Social History  Substance Use Topics  . Smoking status: Current Every Day Smoker    Packs/day: 0.50  . Smokeless tobacco: Never Used  . Alcohol use No   Objective:   BP 120/70 (BP Location: Right Arm, Patient Position: Sitting, Cuff Size: Normal)   Pulse 96   Temp 98.2 F (36.8 C) (Oral)   Resp 16   Wt 159  lb 9.6 oz (72.4 kg)   LMP 09/28/2016   BMI 28.27 kg/m    Physical Exam  Constitutional: She appears well-developed and well-nourished. No distress.  Neck: Normal range of motion. Neck supple.  Cardiovascular: Normal rate, regular rhythm and normal heart sounds.  Exam reveals no gallop and no friction rub.   No murmur heard. Pulmonary/Chest: Effort normal and breath sounds normal. No respiratory distress. She has no wheezes. She has no rales.  Skin: She is not diaphoretic.  Vitals reviewed.      Assessment & Plan:     1. Type 2 diabetes mellitus without complication, unspecified whether long term  insulin use (HCC) A1c continues to improve and is down to 7.4. She continues to work on weight loss and healthy dieting habits. She is wanting to switch back to glipizide from jardiance since jardiance is causing yeast infections and urine to have a foul odor. Will switch as below. Continue metformin 500mg  BID. I will see her back in 3 months to recheck A1c and weight.  - POCT glycosylated hemoglobin (Hb A1C) - glipiZIDE (GLUCOTROL XL) 10 MG 24 hr tablet; Take 1 tablet (10 mg total) by mouth daily with breakfast.  Dispense: 90 tablet; Refill: 1  2. Hair loss Patient reports it is coming out in clumps in her brush and when she washes her hair. Will check thyroid labs as below and will f/u pending results. - Thyroid Panel With TSH  3. Yeast infection Yeast infection secondary to jardiance. Diflucan sent in as below. She is to call if no improvement in symptoms.  - fluconazole (DIFLUCAN) 150 MG tablet; Take 1 tablet (150 mg total) by mouth once.  Dispense: 1 tablet; Refill: 0       Margaretann Loveless, PA-C  [facility]

## 2016-10-16 ENCOUNTER — Other Ambulatory Visit: Payer: Self-pay | Admitting: Physician Assistant

## 2016-10-16 DIAGNOSIS — F17200 Nicotine dependence, unspecified, uncomplicated: Secondary | ICD-10-CM

## 2016-10-16 DIAGNOSIS — R601 Generalized edema: Secondary | ICD-10-CM

## 2016-11-09 ENCOUNTER — Ambulatory Visit: Payer: BLUE CROSS/BLUE SHIELD | Admitting: Physician Assistant

## 2016-12-09 ENCOUNTER — Ambulatory Visit: Payer: BLUE CROSS/BLUE SHIELD | Admitting: Physician Assistant

## 2016-12-09 NOTE — Progress Notes (Deleted)
       Patient: Bethany Edwards Female    DOB: May 11, 1980   36 y.o.   MRN: [id number] Visit Date: 12/09/2016  Today's Provider: Margaretann Loveless, PA-C   No chief complaint on file.  Subjective:    HPI  Diabetes Mellitus Type II, Follow-up:   Lab Results  Component Value Date   HGBA1C 7.4 10/14/2016   HGBA1C 7.7 07/14/2016   HGBA1C 8.1 03/31/2016    Last seen for diabetes 2 months ago.  Management since then includes continue Metformin 500MG  BID.Switched back to Glipizide from [place].Since Jardiance was causing yeast infection and urine to have a foul odor. She reports {excellent/good/fair/poor:19665} compliance with treatment. She {ACTION; IS/IS JGG:[id number]} having side effects. *** Current symptoms include {Symptoms; diabetes:14075} and have been {Desc; course:15616}. Home blood sugar records: {diabetes glucometry results:16657}  Episodes of hypoglycemia? {yes***/no:17258}   Most Recent Eye Exam: *** Weight trend: {trend:16658} Current diet: in general, a "healthy" diet   Current exercise: {exercise types:16438}  Pertinent Labs:    Component Value Date/Time   CHOL 169 12/02/2015 0940   TRIG 237 (H) 12/02/2015 0940   HDL 29 (L) 12/02/2015 0940   LDLCALC 93 12/02/2015 0940   CREATININE 0.76 12/02/2015 0940    Wt Readings from Last 3 Encounters:  10/14/16 159 lb 9.6 oz (72.4 kg)  08/10/16 161 lb (73 kg)  07/14/16 164 lb 3.2 oz (74.5 kg)    ------------------------------------------------------------------------ She is also here for recheck on weight. She is keeping a healthy diet.       Allergies  Allergen Reactions  . Penicillins Rash    Can take Cephalosporin     Current Outpatient Prescriptions:  .  albuterol (PROVENTIL HFA;VENTOLIN HFA) 108 (90 Base) MCG/ACT inhaler, Inhale 2 puffs into the lungs every 6 (six) hours as needed for wheezing or shortness of breath., Disp: 1 Inhaler, Rfl: 0 .  atorvastatin (LIPITOR) 10 MG tablet, Take 1 tablet (10  mg total) by mouth daily., Disp: 30 tablet, Rfl: 5 .  buPROPion (WELLBUTRIN SR) 150 MG 12 hr tablet, TAKE 1 TABLET BY MOUTH TWICE DAILY, Disp: 60 tablet, Rfl: 5 .  furosemide (LASIX) 20 MG tablet, TAKE 1 TABLET BY MOUTH EVERY DAY, Disp: 30 tablet, Rfl: 5 .  glipiZIDE (GLUCOTROL XL) 10 MG 24 hr tablet, Take 1 tablet (10 mg total) by mouth daily with breakfast., Disp: 90 tablet, Rfl: 1 .  metFORMIN (GLUCOPHAGE) 500 MG tablet, TAKE 1 TABLET (500 MG TOTAL) BY MOUTH 2 (TWO) TIMES DAILY WITH A MEAL., Disp: 180 tablet, Rfl: 1 .  phentermine (ADIPEX-P) 37.5 MG tablet, Take 1 tablet (37.5 mg total) by mouth daily before breakfast., Disp: 30 tablet, Rfl: 2  Review of Systems  Social History  Substance Use Topics  . Smoking status: Current Every Day Smoker    Packs/day: 0.50  . Smokeless tobacco: Never Used  . Alcohol use No   Objective:   There were no vitals taken for this visit.   Physical Exam      Assessment & Plan:           Margaretann Loveless, PA-C  [facility]

## 2017-01-20 ENCOUNTER — Encounter: Payer: Self-pay | Admitting: Physician Assistant

## 2017-01-20 ENCOUNTER — Ambulatory Visit (INDEPENDENT_AMBULATORY_CARE_PROVIDER_SITE_OTHER): Payer: BLUE CROSS/BLUE SHIELD | Admitting: Physician Assistant

## 2017-01-20 VITALS — BP 114/60 | HR 78 | Temp 98.7°F | Resp 16 | Wt 170.8 lb

## 2017-01-20 DIAGNOSIS — E119 Type 2 diabetes mellitus without complications: Secondary | ICD-10-CM

## 2017-01-20 DIAGNOSIS — D649 Anemia, unspecified: Secondary | ICD-10-CM | POA: Diagnosis not present

## 2017-01-20 DIAGNOSIS — F17200 Nicotine dependence, unspecified, uncomplicated: Secondary | ICD-10-CM | POA: Diagnosis not present

## 2017-01-20 DIAGNOSIS — E6609 Other obesity due to excess calories: Secondary | ICD-10-CM | POA: Diagnosis not present

## 2017-01-20 DIAGNOSIS — R601 Generalized edema: Secondary | ICD-10-CM | POA: Diagnosis not present

## 2017-01-20 DIAGNOSIS — E7800 Pure hypercholesterolemia, unspecified: Secondary | ICD-10-CM

## 2017-01-20 DIAGNOSIS — Z6830 Body mass index (BMI) 30.0-30.9, adult: Secondary | ICD-10-CM

## 2017-01-20 DIAGNOSIS — Z713 Dietary counseling and surveillance: Secondary | ICD-10-CM

## 2017-01-20 DIAGNOSIS — Z6831 Body mass index (BMI) 31.0-31.9, adult: Secondary | ICD-10-CM

## 2017-01-20 DIAGNOSIS — Z23 Encounter for immunization: Secondary | ICD-10-CM | POA: Diagnosis not present

## 2017-01-20 MED ORDER — BUPROPION HCL ER (SR) 150 MG PO TB12: 150.0000 mg | ORAL_TABLET | Freq: Two times a day (BID) | ORAL | 3 refills | Status: DC

## 2017-01-20 MED ORDER — FUROSEMIDE 20 MG PO TABS: 20.0000 mg | ORAL_TABLET | Freq: Every day | ORAL | 3 refills | Status: DC

## 2017-01-20 MED ORDER — METFORMIN HCL 500 MG PO TABS: ORAL_TABLET | ORAL | 3 refills | Status: DC

## 2017-01-20 MED ORDER — GLIPIZIDE ER 10 MG PO TB24: 10.0000 mg | ORAL_TABLET | Freq: Every day | ORAL | 3 refills | Status: DC

## 2017-01-20 MED ORDER — ATORVASTATIN CALCIUM 10 MG PO TABS: 10.0000 mg | ORAL_TABLET | Freq: Every day | ORAL | 3 refills | Status: DC

## 2017-01-20 MED ORDER — PHENTERMINE HCL 37.5 MG PO TABS: 37.5000 mg | ORAL_TABLET | Freq: Every day | ORAL | 2 refills | Status: DC

## 2017-01-20 NOTE — Progress Notes (Signed)
Patient: Bethany Edwards Female    DOB: [birth year]   36 y.o.   MRN: [id number] Visit Date: 01/20/2017  Today's Provider: Margaretann Loveless, PA-C   Chief Complaint  Patient presents with  . Medication Refill  . Diabetes   Subjective:    HPI Patient is here today because she needs refills on all her medications.   Diabetes Mellitus Type II, Follow-up:   Lab Results  Component Value Date   HGBA1C 7.4 10/14/2016   HGBA1C 7.7 07/14/2016   HGBA1C 8.1 03/31/2016    Last seen for diabetes 3 months ago.  Management since then includes none. She reports excellent compliance with treatment. She is not having side effects.  Current symptoms include none and have been stable. Home blood sugar records: in the last 2-3 weeks upper 100's-200's. Normally in the 70's to low 100's. She reports that she run out of her medications.  Episodes of hypoglycemia? no   Current Insulin Regimen:  Most Recent Eye Exam: She is due for one. Weight trend: increasing steadily Prior visit with dietician: no Current diet: in general, a "healthy" diet   Current exercise: she is active with daily activities and walking. She is trying to use her treadmill more.  Pertinent Labs:    Component Value Date/Time   CHOL 169 12/02/2015 0940   TRIG 237 (H) 12/02/2015 0940   HDL 29 (L) 12/02/2015 0940   LDLCALC 93 12/02/2015 0940   CREATININE 0.76 12/02/2015 0940    Wt Readings from Last 3 Encounters:  01/20/17 170 lb 12.8 oz (77.5 kg)  10/14/16 159 lb 9.6 oz (72.4 kg)  08/10/16 161 lb (73 kg)   ------------------------------------------------------------------------  Patient also requesting Phentermine refill.She reports that she never went to get the medication. She reports she is eating a little more healthier.     Allergies  Allergen Reactions  . Penicillins Rash    Can take Cephalosporin     Current Outpatient Prescriptions:  .  atorvastatin (LIPITOR) 10 MG tablet, Take 1 tablet (10  mg total) by mouth daily., Disp: 30 tablet, Rfl: 5 .  buPROPion (WELLBUTRIN SR) 150 MG 12 hr tablet, TAKE 1 TABLET BY MOUTH TWICE DAILY, Disp: 60 tablet, Rfl: 5 .  furosemide (LASIX) 20 MG tablet, TAKE 1 TABLET BY MOUTH EVERY DAY, Disp: 30 tablet, Rfl: 5 .  glipiZIDE (GLUCOTROL XL) 10 MG 24 hr tablet, Take 1 tablet (10 mg total) by mouth daily with breakfast., Disp: 90 tablet, Rfl: 1 .  metFORMIN (GLUCOPHAGE) 500 MG tablet, TAKE 1 TABLET (500 MG TOTAL) BY MOUTH 2 (TWO) TIMES DAILY WITH A MEAL., Disp: 180 tablet, Rfl: 1 .  albuterol (PROVENTIL HFA;VENTOLIN HFA) 108 (90 Base) MCG/ACT inhaler, Inhale 2 puffs into the lungs every 6 (six) hours as needed for wheezing or shortness of breath. (Patient not taking: Reported on 01/20/2017), Disp: 1 Inhaler, Rfl: 0 .  phentermine (ADIPEX-P) 37.5 MG tablet, Take 1 tablet (37.5 mg total) by mouth daily before breakfast. (Patient not taking: Reported on 01/20/2017), Disp: 30 tablet, Rfl: 2  Review of Systems  Constitutional: Negative.   Respiratory: Negative.   Cardiovascular: Negative.   Gastrointestinal: Negative.   Musculoskeletal: Negative.   Skin: Negative.   Neurological: Negative.     Social History  Substance Use Topics  . Smoking status: Current Every Day Smoker    Packs/day: 0.50  . Smokeless tobacco: Never Used  . Alcohol use No   Objective:   BP 114/60 (BP  Location: Left Arm, Patient Position: Sitting, Cuff Size: Large)   Pulse 78   Temp 98.7 F (37.1 C) (Oral)   Resp 16   Wt 170 lb 12.8 oz (77.5 kg)   LMP 01/18/2017   SpO2 98%   BMI 30.26 kg/m     Physical Exam  Constitutional: She appears well-developed and well-nourished. No distress.  Neck: Normal range of motion. Neck supple. No JVD present. No tracheal deviation present. No thyromegaly present.  Cardiovascular: Normal rate, regular rhythm and normal heart sounds.  Exam reveals no gallop and no friction rub.   No murmur heard. Pulmonary/Chest: Effort normal and breath  sounds normal. No respiratory distress. She has no wheezes. She has no rales.  Musculoskeletal: She exhibits no edema.  Lymphadenopathy:    She has no cervical adenopathy.  Skin: She is not diaphoretic.  Psychiatric: She has a normal mood and affect. Her behavior is normal. Judgment and thought content normal.  Vitals reviewed.  Diabetic Foot Exam - Simple   Simple Foot Form Diabetic Foot exam was performed with the following findings:  Yes 01/20/2017 10:14 AM  Visual Inspection No deformities, no ulcerations, no other skin breakdown bilaterally:  Yes Sensation Testing Intact to touch and monofilament testing bilaterally:  Yes Pulse Check Posterior Tibialis and Dorsalis pulse intact bilaterally:  Yes Comments       Assessment & Plan:     1. Type 2 diabetes mellitus without complication, unspecified whether long term insulin use (HCC) Stable. Diagnosis pulled for medication refill. Continue current medical treatment plan. Will check labs as below and f/u pending results. - CBC w/Diff/Platelet - COMPLETE METABOLIC PANEL WITH GFR - Lipid Profile - HgB A1c - glipiZIDE (GLUCOTROL XL) 10 MG 24 hr tablet; Take 1 tablet (10 mg total) by mouth daily with breakfast.  Dispense: 90 tablet; Refill: 3 - metFORMIN (GLUCOPHAGE) 500 MG tablet; TAKE 1 TABLET (500 MG TOTAL) BY MOUTH 2 (TWO) TIMES DAILY WITH A MEAL.  Dispense: 180 tablet; Refill: 3  2. Compulsive tobacco user syndrome Patient not willing to quit at this time.   3. Hypercholesteremia Stable. Diagnosis pulled for medication refill. Continue current medical treatment plan. Will check labs as below and f/u pending results. - CBC w/Diff/Platelet - COMPLETE METABOLIC PANEL WITH GFR - Lipid Profile - atorvastatin (LIPITOR) 10 MG tablet; Take 1 tablet (10 mg total) by mouth daily.  Dispense: 90 tablet; Refill: 3  4. Anemia, unspecified type Will check labs as below and f/u pending results. - CBC w/Diff/Platelet - COMPLETE METABOLIC  PANEL WITH GFR  5. BMI 30.0-30.9,adult Patient has not been exercising regularly or eating healthy due to moving. She recently got moved in and has a treadmill she is starting to work out on. She also has been walking her property more in the evenings and push mowing her front yard (about an acre). I will restart phentermine as below. I will see her back in 3 months for weight check and A1c.   6. Encounter for weight loss counseling See above medical treatment plan. - phentermine (ADIPEX-P) 37.5 MG tablet; Take 1 tablet (37.5 mg total) by mouth daily before breakfast.  Dispense: 30 tablet; Refill: 2  7. Class 1 obesity due to excess calories with serious comorbidity and body mass index (BMI) of 30.0 to 30.9 in adult See above medical treatment plan. - phentermine (ADIPEX-P) 37.5 MG tablet; Take 1 tablet (37.5 mg total) by mouth daily before breakfast.  Dispense: 30 tablet; Refill: 2  8. Generalized edema  Stable. Diagnosis pulled for medication refill. Continue current medical treatment plan. - furosemide (LASIX) 20 MG tablet; Take 1 tablet (20 mg total) by mouth daily.  Dispense: 90 tablet; Refill: 3  9. Need for pneumococcal vaccination Pneumovax given to patient without complications. Patient sat for 15 minutes after administration and was tolerated well without adverse effects. - Pneumococcal polysaccharide vaccine 23-valent greater than or equal to 2yo subcutaneous/IM       Margaretann Loveless, PA-C  [facility]

## 2017-01-20 NOTE — Patient Instructions (Signed)

## 2017-01-21 LAB — CBC WITH DIFFERENTIAL/PLATELET
BASOS PCT: 0.4 %
Basophils Absolute: 48 cells/uL (ref 0–200)
EOS PCT: 1.4 %
Eosinophils Absolute: 168 cells/uL (ref 15–500)
HCT: 39.2 % (ref 35.0–45.0)
Hemoglobin: 13.2 g/dL (ref 11.7–15.5)
LYMPHS ABS: 2760 {cells}/uL (ref 850–3900)
MCH: 29.8 pg (ref 27.0–33.0)
MCHC: 33.7 g/dL (ref 32.0–36.0)
MCV: 88.5 fL (ref 80.0–100.0)
MPV: 10.8 fL (ref 7.5–12.5)
Monocytes Relative: 3.7 %
Neutro Abs: 8580 cells/uL — ABNORMAL HIGH (ref 1500–7800)
Neutrophils Relative %: 71.5 %
PLATELETS: 278 10*3/uL (ref 140–400)
RBC: 4.43 10*6/uL (ref 3.80–5.10)
RDW: 13.6 % (ref 11.0–15.0)
TOTAL LYMPHOCYTE: 23 %
WBC: 12 10*3/uL — AB (ref 3.8–10.8)
WBCMIX: 444 {cells}/uL (ref 200–950)

## 2017-01-21 LAB — COMPLETE METABOLIC PANEL WITH GFR
AG RATIO: 1.6 (calc) (ref 1.0–2.5)
ALKALINE PHOSPHATASE (APISO): 64 U/L (ref 33–115)
ALT: 22 U/L (ref 6–29)
AST: 13 U/L (ref 10–30)
Albumin: 4.2 g/dL (ref 3.6–5.1)
BILIRUBIN TOTAL: 0.3 mg/dL (ref 0.2–1.2)
BUN: 9 mg/dL (ref 7–25)
CALCIUM: 9.5 mg/dL (ref 8.6–10.2)
CO2: 28 mmol/L (ref 20–32)
CREATININE: 0.73 mg/dL (ref 0.50–1.10)
Chloride: 102 mmol/L (ref 98–110)
GFR, EST NON AFRICAN AMERICAN: 104 mL/min/{1.73_m2} (ref >=60)
GFR, Est African American: 121 mL/min/{1.73_m2} (ref >=60)
GLOBULIN: 2.7 g/dL (ref 1.9–3.7)
Glucose, Bld: 212 mg/dL — ABNORMAL HIGH (ref 65–139)
Potassium: 5.1 mmol/L (ref 3.5–5.3)
SODIUM: 138 mmol/L (ref 135–146)
TOTAL PROTEIN: 6.9 g/dL (ref 6.1–8.1)

## 2017-01-21 LAB — LIPID PANEL
CHOL/HDL RATIO: 8.3 (calc) — AB (ref <5.0)
CHOLESTEROL: 208 mg/dL — AB (ref <200)
HDL: 25 mg/dL — ABNORMAL LOW (ref >50)
LDL Cholesterol (Calc): 133 mg/dL (calc) — ABNORMAL HIGH
NON-HDL CHOLESTEROL (CALC): 183 mg/dL — AB (ref <130)
Triglycerides: 350 mg/dL — ABNORMAL HIGH (ref <150)

## 2017-01-21 LAB — HEMOGLOBIN A1C
HEMOGLOBIN A1C: 7.8 %{Hb} — AB (ref <5.7)
Mean Plasma Glucose: 177 (calc)
eAG (mmol/L): 9.8 (calc)

## 2017-01-23 ENCOUNTER — Telehealth: Payer: Self-pay

## 2017-01-23 NOTE — Telephone Encounter (Signed)
-----   Message from Margaretann Loveless, [place] sent at 01/23/2017 10:09 AM EDT ----- Cholesterol has worsened from previous, A1c up to 7.8 from 7.4. Make sure to be taking atorvastatin, metformin, and glipizide as prescribed. If taking as prescribed will need to recheck in 3 months to see if with weight loss they improve. Also WBC slightly elevated at 12.0 indicating possible viral infection. Call the office if you develop a fever. We can recheck when we recheck all other labs if desired. Continue to work on lifestyle modifications with healthy dieting and exercise. You can call for lab slip in 3 months to recheck.Marland Kitchen

## 2017-01-23 NOTE — Telephone Encounter (Signed)
Patient advised as below. Patient verbalizes understanding and is in agreement with treatment plan.  

## 2017-06-15 ENCOUNTER — Ambulatory Visit
Admission: RE | Admit: 2017-06-15 | Discharge: 2017-06-15 | Disposition: A | Discharge status: Home / Self Care | Payer: BLUE CROSS/BLUE SHIELD | Source: Ambulatory Visit | Attending: Family Medicine | Admitting: Family Medicine

## 2017-06-15 ENCOUNTER — Encounter: Payer: Self-pay | Admitting: Family Medicine

## 2017-06-15 ENCOUNTER — Ambulatory Visit (INDEPENDENT_AMBULATORY_CARE_PROVIDER_SITE_OTHER): Payer: BLUE CROSS/BLUE SHIELD | Admitting: Family Medicine

## 2017-06-15 VITALS — BP 126/80 | HR 78 | Temp 98.8°F | Resp 16 | Wt 171.0 lb

## 2017-06-15 DIAGNOSIS — R140 Abdominal distension (gaseous): Secondary | ICD-10-CM | POA: Insufficient documentation

## 2017-06-15 DIAGNOSIS — K5901 Slow transit constipation: Secondary | ICD-10-CM

## 2017-06-15 NOTE — Patient Instructions (Signed)
We will call you with the x-ray report. Try popsicles/ice pending x-ray report.

## 2017-06-15 NOTE — Progress Notes (Signed)
Subjective:     Patient ID: Bethany Edwards, female   DOB: 04/09/1981, 37 y.o.   MRN: [id number] Chief Complaint  Patient presents with  . Constipation    Patient reports that this is a chronic issue. She reports that she uses Miralax every morning. She has taken she has taken 60cc of Milk of mag, liquid colace all within the last 24hrs. She has tried an enema about 4 days ago and has not able to have a BM. She also has nausea and a bloating sensation.    HPI States her normal bowel pattern is  2-3 x month managed with Miralax and stool softeners:"I don't remember when I last had a bowel movement." Believes It has been about two weeks. Reports nausea and 4-5 abdominal pain. No vomiting or fever. 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 She has checked herself for impaction. Hx of diabetes. Currently on her menses.  Review of Systems     Objective:   Physical Exam  Constitutional: She appears well-developed and well-nourished. No distress.  Abdominal: There is tenderness ( moderate in left quadrants ). There is no guarding.  Bowel sounds absent with no peristaltic waves noted.     Assessment:    1. Constipation by delayed colonic transit: ? Obstruction ? gastroparesis - DG Abd 2 Views; Future    Plan:    Suggested she may need to go to ER if adverse findings on x-ray or Urgent G.I. Consult.

## 2017-06-16 ENCOUNTER — Other Ambulatory Visit: Payer: Self-pay

## 2017-06-16 ENCOUNTER — Emergency Department: Payer: BLUE CROSS/BLUE SHIELD

## 2017-06-16 ENCOUNTER — Encounter: Payer: Self-pay | Admitting: Gastroenterology

## 2017-06-16 ENCOUNTER — Emergency Department
Admission: EM | Admit: 2017-06-16 | Discharge: 2017-06-16 | Disposition: A | Discharge status: Home / Self Care | Payer: BLUE CROSS/BLUE SHIELD | Attending: Emergency Medicine | Admitting: Emergency Medicine

## 2017-06-16 ENCOUNTER — Other Ambulatory Visit: Payer: Self-pay | Admitting: Family Medicine

## 2017-06-16 ENCOUNTER — Encounter: Payer: Self-pay | Admitting: Emergency Medicine

## 2017-06-16 ENCOUNTER — Ambulatory Visit (INDEPENDENT_AMBULATORY_CARE_PROVIDER_SITE_OTHER): Payer: BLUE CROSS/BLUE SHIELD | Admitting: Gastroenterology

## 2017-06-16 VITALS — BP 139/79 | HR 98 | Temp 98.3°F | Ht 62.5 in | Wt 176.4 lb

## 2017-06-16 DIAGNOSIS — K5901 Slow transit constipation: Secondary | ICD-10-CM

## 2017-06-16 DIAGNOSIS — E119 Type 2 diabetes mellitus without complications: Secondary | ICD-10-CM | POA: Insufficient documentation

## 2017-06-16 DIAGNOSIS — R1032 Left lower quadrant pain: Secondary | ICD-10-CM | POA: Diagnosis not present

## 2017-06-16 DIAGNOSIS — Z79899 Other long term (current) drug therapy: Secondary | ICD-10-CM | POA: Insufficient documentation

## 2017-06-16 DIAGNOSIS — Z7984 Long term (current) use of oral hypoglycemic drugs: Secondary | ICD-10-CM | POA: Insufficient documentation

## 2017-06-16 DIAGNOSIS — K5900 Constipation, unspecified: Secondary | ICD-10-CM | POA: Insufficient documentation

## 2017-06-16 DIAGNOSIS — R109 Unspecified abdominal pain: Secondary | ICD-10-CM

## 2017-06-16 DIAGNOSIS — K5904 Chronic idiopathic constipation: Secondary | ICD-10-CM | POA: Diagnosis not present

## 2017-06-16 DIAGNOSIS — F17200 Nicotine dependence, unspecified, uncomplicated: Secondary | ICD-10-CM | POA: Insufficient documentation

## 2017-06-16 HISTORY — DX: Type 2 diabetes mellitus without complications: E11.9

## 2017-06-16 LAB — COMPREHENSIVE METABOLIC PANEL
ALK PHOS: 80 U/L (ref 38–126)
ALT: 68 U/L — AB (ref 14–54)
AST: 35 U/L (ref 15–41)
Albumin: 4.5 g/dL (ref 3.5–5.0)
Anion gap: 12 (ref 5–15)
BUN: 7 mg/dL (ref 6–20)
CALCIUM: 8.8 mg/dL — AB (ref 8.9–10.3)
CO2: 24 mmol/L (ref 22–32)
CREATININE: 0.59 mg/dL (ref 0.44–1.00)
Chloride: 104 mmol/L (ref 101–111)
Glucose, Bld: 275 mg/dL — ABNORMAL HIGH (ref 65–99)
Potassium: 3.8 mmol/L (ref 3.5–5.1)
Sodium: 140 mmol/L (ref 135–145)
Total Bilirubin: 0.4 mg/dL (ref 0.3–1.2)
Total Protein: 7.8 g/dL (ref 6.5–8.1)

## 2017-06-16 LAB — URINALYSIS, COMPLETE (UACMP) WITH MICROSCOPIC
Bilirubin Urine: NEGATIVE
Ketones, ur: NEGATIVE mg/dL
Leukocytes, UA: NEGATIVE
Nitrite: NEGATIVE
Protein, ur: NEGATIVE mg/dL
SPECIFIC GRAVITY, URINE: 1.001 — AB (ref 1.005–1.030)
pH: 7 (ref 5.0–8.0)

## 2017-06-16 LAB — CBC WITH DIFFERENTIAL/PLATELET
Basophils Absolute: 0 10*3/uL (ref 0–0.1)
Basophils Relative: 0 %
EOS PCT: 2 %
Eosinophils Absolute: 0.1 10*3/uL (ref 0–0.7)
HCT: 40.7 % (ref 35.0–47.0)
HEMOGLOBIN: 13.5 g/dL (ref 12.0–16.0)
LYMPHS ABS: 1.7 10*3/uL (ref 1.0–3.6)
LYMPHS PCT: 21 %
MCH: 29.8 pg (ref 26.0–34.0)
MCHC: 33.3 g/dL (ref 32.0–36.0)
MCV: 89.6 fL (ref 80.0–100.0)
Monocytes Absolute: 0.4 10*3/uL (ref 0.2–0.9)
Monocytes Relative: 5 %
NEUTROS PCT: 72 %
Neutro Abs: 5.7 10*3/uL (ref 1.4–6.5)
Platelets: 237 10*3/uL (ref 150–440)
RBC: 4.54 MIL/uL (ref 3.80–5.20)
RDW: 15.4 % — ABNORMAL HIGH (ref 11.5–14.5)
WBC: 7.9 10*3/uL (ref 3.6–11.0)

## 2017-06-16 LAB — POCT PREGNANCY, URINE: PREG TEST UR: NEGATIVE

## 2017-06-16 LAB — LIPASE, BLOOD: LIPASE: 34 U/L (ref 11–51)

## 2017-06-16 MED ORDER — ONDANSETRON HCL 4 MG/2ML IJ SOLN: 4.0000 mg | Freq: Once | INTRAMUSCULAR | Status: DC

## 2017-06-16 MED ORDER — ONDANSETRON 4 MG PO TBDP: 4.0000 mg | ORAL_TABLET | Freq: Three times a day (TID) | ORAL | 0 refills | Status: DC | PRN

## 2017-06-16 MED ORDER — SODIUM CHLORIDE 0.9 % IV BOLUS (SEPSIS): 1000.0000 mL | Freq: Once | INTRAVENOUS | Status: DC

## 2017-06-16 MED ORDER — IOPAMIDOL (ISOVUE-300) INJECTION 61%
100.0000 mL | Freq: Once | INTRAVENOUS | Status: AC | PRN
Start: 2017-06-16 — End: 2017-06-16
  Administered 2017-06-16: 100 mL via INTRAVENOUS

## 2017-06-16 MED ORDER — SODIUM CHLORIDE 0.9 % IV BOLUS (SEPSIS)
1000.0000 mL | Freq: Once | INTRAVENOUS | Status: AC
  Administered 2017-06-16: 1000 mL via INTRAVENOUS

## 2017-06-16 MED ORDER — ONDANSETRON HCL 4 MG/2ML IJ SOLN
4.0000 mg | Freq: Once | INTRAMUSCULAR | Status: AC
  Administered 2017-06-16: 4 mg via INTRAVENOUS
  Filled 2017-06-16: qty 2

## 2017-06-16 MED ORDER — MAGNESIUM CITRATE PO SOLN
0.5000 | Freq: Once | ORAL | Status: AC
  Administered 2017-06-16: 0.5
  Filled 2017-06-16: qty 296

## 2017-06-16 MED ORDER — POLYETHYLENE GLYCOL 3350 17 GM/SCOOP PO POWD: 1.0000 | Freq: Once | ORAL | 0 refills | Status: AC

## 2017-06-16 MED ORDER — DOCUSATE SODIUM 50 MG/5ML PO LIQD
100.0000 mg | Freq: Once | ORAL | Status: AC
  Administered 2017-06-16: 100 mg
  Filled 2017-06-16 (×3): qty 10

## 2017-06-16 MED ORDER — KETOROLAC TROMETHAMINE 30 MG/ML IJ SOLN: 30.0000 mg | Freq: Once | INTRAMUSCULAR | Status: DC

## 2017-06-16 MED ORDER — ACETAMINOPHEN 325 MG PO TABS
650.0000 mg | ORAL_TABLET | Freq: Once | ORAL | Status: AC
  Administered 2017-06-16: 650 mg via ORAL
  Filled 2017-06-16: qty 2

## 2017-06-16 NOTE — Progress Notes (Signed)
Bethany Repressohini R Delaine Canter, MD [address]  [address]  [place], [place] [address]  Main: [phone]  Fax: [phone]    Gastroenterology Consultation  Referring Provider:     Suella GroveBurnette, Bethany Edwards, P* Primary Care Physician:  Bethany LovelessBurnette, Bethany M, PA-C Primary Gastroenterologist:  Dr. Arlyss Repressohini R Kaydense Edwards Reason for Consultation:     Chronic constipation, last BM 2 weeks ago        HPI:   Bethany Edwards is a 37 y.o. female referred by Dr. Rosezetta SchlatterBurnette, Bethany BevelsJennifer M, PA-C  for consultation & management of chronic constipation. She has history of type 2 diabetes, hemoglobin A1c 7.8 in 01/2017. Patient reports that she has been dealing with constipation since she was in 3920s, usually has a bowel movement for 2-3 times per month. Most of this time, feels like liquid is coming around the stool and she has a BM. For the last 2 weeks, she hasn't had a bowel movement and has been experiencing severe abdominal discomfort, bloating. At baseline, she uses MiraLAX daily, Mexican coffee. Within last 2 weeks, she tried to [employer] enemas past Sunday, 2 Dulcolax Monday, she didn't take anything on Tuesday, on Wednesday she had a colon cleansing cocktail that was given at her work which included 60 mL lactulose, 60 mL milk of magnesia, Colace mixed with orange juice, prune juice and senna. She has been experiencing severe abdominal discomfort, bloating, shortness of breath and very uncomfortable. She had an extremely abdomen on 06/15/2017 this did not reveal obvious bowel obstruction. She was referred to GI as an urgent visit today for further evaluation. Patient appeared very uncomfortable during the visit today, unable to lay flat and unable to get up from lying down. She denies weight loss, loss of appetite, nausea, vomiting. She denies rectal bleeding. TSH was normal in 08/2015  She had 2 pregnancies, reports as uneventful. She smokes cigarettes  NSAIDs: None  Antiplts/Anticoagulants/Anti thrombotics: None  GI  Procedures: None She is adopted, unknown family history She had tubal ligation only  Past Medical History:  Diagnosis Date  . Depression   . Diabetes mellitus without complication (HCC)    Type II  . Hyperlipidemia     Past Surgical History:  Procedure Laterality Date  . INCISION AND DRAINAGE ABSCESS Left 2003 and 2007   Axillary Abscess  . TUBAL LIGATION  2003    Prior to Admission medications   Medication Sig Start Date End Date Taking? Authorizing Provider  albuterol (PROVENTIL HFA;VENTOLIN HFA) 108 (90 Base) MCG/ACT inhaler Inhale 2 puffs into the lungs every 6 (six) hours as needed for wheezing or shortness of breath. Patient not taking: Reported on 01/20/2017 09/07/15   Lorie PhenixMaloney, Evette, MD  atorvastatin (LIPITOR) 10 MG tablet Take 1 tablet (10 mg total) by mouth daily. 01/20/17   Bethany LovelessBurnette, Bethany M, PA-C  buPROPion (WELLBUTRIN SR) 150 MG 12 hr tablet Take 1 tablet (150 mg total) by mouth 2 (two) times daily. 01/20/17   Bethany LovelessBurnette, Bethany M, PA-C  furosemide (LASIX) 20 MG tablet Take 1 tablet (20 mg total) by mouth daily. 01/20/17   Bethany LovelessBurnette, Bethany M, PA-C  glipiZIDE (GLUCOTROL XL) 10 MG 24 hr tablet Take 1 tablet (10 mg total) by mouth daily with breakfast. 01/20/17   Bethany LovelessBurnette, Bethany M, PA-C  metFORMIN (GLUCOPHAGE) 500 MG tablet TAKE 1 TABLET (500 MG TOTAL) BY MOUTH 2 (TWO) TIMES DAILY WITH A MEAL. 01/20/17   Bethany LovelessBurnette, Bethany M, PA-C  phentermine (ADIPEX-P) 37.5 MG tablet Take 1 tablet (37.5 mg total)  by mouth daily before breakfast. 01/20/17   Bethany Loveless, PA-C    Family History  Adopted: Yes  Family history unknown: Yes     Social History   Tobacco Use  . Smoking status: Current Every Day Smoker    Packs/day: 0.50  . Smokeless tobacco: Never Used  Substance Use Topics  . Alcohol use: No  . Drug use: No    Allergies as of 06/16/2017 - Review Complete 06/16/2017  Allergen Reaction Noted  . Penicillins Rash 09/11/2014    Review of Systems:    All  systems reviewed and negative except where noted in HPI.   Physical Exam:  BP 139/79   Pulse 98   Temp 98.3 F (36.8 C) (Oral)   Ht 5' 2.5" (1.588 Edwards)   Wt 176 lb 6.4 oz (80 kg)   LMP 06/15/2017 (Exact Date)   BMI 31.75 kg/Edwards  Patient's last menstrual period was 06/15/2017 (exact date).  General:   Alert,  Well-developed, well-nourished, in mild to moderate distress Head:  Normocephalic and atraumatic. Eyes:  Sclera clear, no icterus.   Conjunctiva pink. Ears:  Normal auditory acuity. Nose:  No deformity, discharge, or lesions. Mouth:  No deformity or lesions,oropharynx pink & moist. Neck:  Supple; no masses or thyromegaly. Lungs:  Respirations even and unlabored.  Clear throughout to auscultation.   No wheezes, crackles, or rhonchi. No acute distress. Heart:  Regular rate and rhythm; no murmurs, clicks, rubs, or gallops. Abdomen: Distended, soft, mild lower abdominal tenderness, absent bowel sounds, no rebound guarding or rigidity Rectal: The rectal vault was completely empty, felt secondary to acute angle and hard stool was palpable in the sigmoid colon which was pressing on the rectal wall Msk:  Symmetrical without gross deformities. Good, equal movement & strength bilaterally. Pulses:  Normal pulses noted. Extremities:  No clubbing or edema.  No cyanosis. Neurologic:  Alert and oriented x3;  grossly normal neurologically. Skin:  Intact without significant lesions or rashes. No jaundice. Psych:  Alert and cooperative. Normal mood and affect.  Imaging Studies: X-ray KUB revealed nonobstructive bowel gas pattern. There was no free peritoneal air. The bowel loops were not dilated  Assessment and Plan:   Bethany Edwards is a 37 y.o. Caucasian female with history of diabetes, hyperlipidemia, chronic constipation presents with 2 weeks of worsening constipation, abdominal distention, not able to pass gas, no bowel movement. Her abdominal exam is concerning for stool impaction in  rectosigmoid colon and partial bowel obstruction.   - Recommend inpatient evaluation, told her to go to emergency room directly from clinic - Stat CT A/P to evaluate for underlying malignancy, colonic or pelvic, surgery consult depending on the CT findings - Inpatient GI consult - Nothing by mouth   Follow up in clinic when discharged from the hospital    Bethany Repress, MD

## 2017-06-16 NOTE — ED Triage Notes (Signed)
Pt to ED c/o constipation for several weeks, hx of constipation, was seen yesterday with xray done showing constipation but no blockage.  States has tried everything at home without relief: dulcolax, enema, milk of magnesia, laxatives.

## 2017-06-16 NOTE — ED Notes (Signed)
Returned from CT, Enema given

## 2017-06-16 NOTE — ED Provider Notes (Signed)
Labs Reviewed  CBC WITH DIFFERENTIAL/PLATELET - Abnormal; Notable for the following components:      Result Value   RDW 15.4 (*)    All other components within normal limits  COMPREHENSIVE METABOLIC PANEL - Abnormal; Notable for the following components:   Glucose, Bld 275 (*)    Calcium 8.8 (*)    ALT 68 (*)    All other components within normal limits  URINALYSIS, COMPLETE (UACMP) WITH MICROSCOPIC - Abnormal; Notable for the following components:   Color, Urine STRAW (*)    APPearance CLEAR (*)    Specific Gravity, Urine 1.001 (*)    Glucose, UA >=500 (*)    Hgb urine dipstick MODERATE (*)    Bacteria, UA RARE (*)    Squamous Epithelial / LPF 0-5 (*)    All other components within normal limits  LIPASE, BLOOD  POC URINE PREG, ED  POCT PREGNANCY, URINE   CT findings IMPRESSION: Hepatic steatosis  Nonobstructing lower pole nephrolithiasis bilaterally  Aortic atherosclerosis without aneurysm  No acute intra-abdominal or pelvic finding  Patient is in no distress, will be discharged with MiraLAX.   Emily FilbertWilliams, Jonathan E, MD 06/16/17 1630

## 2017-06-16 NOTE — ED Triage Notes (Signed)
FIRST NURSE NOTE-pt reports her doctor sent her for CT scan r/t constipation. Had xray yesterday. Nausea no vomiting. Ambulatory without distress.

## 2017-06-16 NOTE — ED Notes (Signed)
AAOx3.  Skin warm and dry.  NAD 

## 2017-06-16 NOTE — ED Provider Notes (Signed)
[facility] Emergency Department Provider Note  ___________________________________________   First MD Initiated Contact with Patient 06/16/17 1148     (approximate)  I have reviewed the triage vital signs and the nursing notes.   HISTORY  Chief Complaint Abdominal Pain and Constipation   HPI Bethany Edwards is a 37 y.o. female with a history of diabetes and chronic constipation who is presenting to the emergency department today with left-sided abdominal pain as well as constipation.  The patient says that she does not remember the last time she had a bowel movement.  She says that she has no longer passing gas and that she feels nauseous at this time.  She was seen by her primary care doctor yesterday who did an abdominal x-ray and then was sent to GI emergently today for outpatient evaluation.  The patient has been taking enemas at home as well as multiple p.o. stool softeners and laxatives without bowel movement.  Patient says that she has nausea at this time and her abdomen is distended with 2-3 out of 10 pain to the left side of her abdomen which is cramping and constant.  She says that she has had ongoing issues with constipation for years.  She was seen by Dr. Allegra LaiVanga today in GI who sent her into the emergency department for CT of the abdomen for concern for an obstructing mass.  Rectal exam done earlier today by Dr. Allegra LaiVanga with an empty rectal vault.  Past Medical History:  Diagnosis Date  . Depression   . Diabetes mellitus without complication (HCC)    Type II  . Hyperlipidemia     Patient Active Problem List   Diagnosis Date Noted  . Chronic idiopathic constipation 06/16/2017  . BMI 30.0-30.9,adult 12/02/2015  . Cutaneous skin tags 02/10/2015  . Abscess 11/13/2014  . Allergic rhinitis 09/11/2014  . Absolute anemia 09/11/2014  . Anxiety 09/11/2014  . Diabetes (HCC) 09/11/2014  . 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 Genital herpes 09/11/2014  .  Hypercholesteremia 09/11/2014  . Irregular bleeding 05/06/2007  . Adiposity 05/06/2007  . Compulsive tobacco user syndrome 05/06/2007    Past Surgical History:  Procedure Laterality Date  . INCISION AND DRAINAGE ABSCESS Left 2003 and 2007   Axillary Abscess  . TUBAL LIGATION  2003    Prior to Admission medications   Medication Sig Start Date End Date Taking? Authorizing Provider  atorvastatin (LIPITOR) 10 MG tablet Take 1 tablet (10 mg total) by mouth daily. 01/20/17  Yes Margaretann LovelessBurnette, Jennifer M, PA-C  buPROPion (WELLBUTRIN SR) 150 MG 12 hr tablet Take 1 tablet (150 mg total) by mouth 2 (two) times daily. 01/20/17  Yes Margaretann LovelessBurnette, Jennifer M, PA-C  furosemide (LASIX) 20 MG tablet Take 1 tablet (20 mg total) by mouth daily. 01/20/17  Yes Joycelyn ManBurnette, Jennifer M, PA-C  glipiZIDE (GLUCOTROL XL) 10 MG 24 hr tablet Take 1 tablet (10 mg total) by mouth daily with breakfast. 01/20/17  Yes Burnette, Jennifer M, PA-C  metFORMIN (GLUCOPHAGE) 500 MG tablet TAKE 1 TABLET (500 MG TOTAL) BY MOUTH 2 (TWO) TIMES DAILY WITH A MEAL. 01/20/17  Yes Joycelyn ManBurnette, Jennifer M, PA-C  phentermine (ADIPEX-P) 37.5 MG tablet Take 1 tablet (37.5 mg total) by mouth daily before breakfast. 01/20/17  Yes Burnette, Alessandra BevelsJennifer M, PA-C  albuterol (PROVENTIL HFA;VENTOLIN HFA) 108 (90 Base) MCG/ACT inhaler Inhale 2 puffs into the lungs every 6 (six) hours as needed for wheezing or shortness of breath. Patient not taking: Reported on 01/20/2017 09/07/15   Lorie PhenixMaloney, [place],  MD    Allergies Penicillins  Family History  Adopted: Yes  Family history unknown: Yes    Social History Social History   Tobacco Use  . Smoking status: Current Every Day Smoker    Packs/day: 0.50  . Smokeless tobacco: Never Used  Substance Use Topics  . Alcohol use: No  . Drug use: No    Review of Systems  Constitutional: No fever/chills Eyes: No visual changes. ENT: No sore throat. Cardiovascular: Denies chest pain. Respiratory: Denies shortness of  breath. Gastrointestinal: no vomiting.  No diarrhea.   Genitourinary: Negative for dysuria. Musculoskeletal: Negative for back pain. Skin: Negative for rash. Neurological: Negative for headaches, focal weakness or numbness.   ____________________________________________   PHYSICAL EXAM:  VITAL SIGNS: ED Triage Vitals  Enc Vitals Group     BP 06/16/17 1138 (!) 151/84     Pulse Rate 06/16/17 1138 88     Resp 06/16/17 1138 16     Temp 06/16/17 1138 99 F (37.2 C)     Temp Source 06/16/17 1138 Oral     SpO2 06/16/17 1138 98 %     Weight 06/16/17 1142 176 lb (79.8 kg)     Height 06/16/17 1142 5' 2.5" (1.588 m)     Head Circumference --      Peak Flow --      Pain Score 06/16/17 1142 3     Pain Loc --      Pain Edu? --      Excl. in GC? --     Constitutional: Alert and oriented. Well appearing and in no acute distress. Eyes: Conjunctivae are normal.  Head: Atraumatic. Nose: No congestion/rhinnorhea. Mouth/Throat: Mucous membranes are moist.  Neck: No stridor.   Cardiovascular: Normal rate, regular rhythm. Grossly normal heart sounds.  Good peripheral circulation. Respiratory: Normal respiratory effort.  No retractions. Lungs CTAB. Gastrointestinal: Soft with mild to moderate tenderness to palpation left upper as well as left lower quadrant without any right-sided tenderness to palpation.  The abdomen is mildly to moderately distended.  Musculoskeletal: No lower extremity tenderness nor edema.  No joint effusions. Neurologic:  Normal speech and language. No gross focal neurologic deficits are appreciated. Skin:  Skin is warm, dry and intact. No rash noted. Psychiatric: Mood and affect are normal. Speech and behavior are normal.  ____________________________________________   LABS (all labs ordered are listed, but only abnormal results are displayed)  Labs Reviewed  CBC WITH DIFFERENTIAL/PLATELET - Abnormal; Notable for the following components:      Result Value   RDW  15.4 (*)    All other components within normal limits  COMPREHENSIVE METABOLIC PANEL  LIPASE, BLOOD  URINALYSIS, COMPLETE (UACMP) WITH MICROSCOPIC  POC URINE PREG, ED   ____________________________________________  EKG   ____________________________________________  RADIOLOGY   ____________________________________________   PROCEDURES  Procedure(s) performed:   Procedures  Critical Care performed:   ____________________________________________   INITIAL IMPRESSION / ASSESSMENT AND PLAN / ED COURSE  Pertinent labs & imaging results that were available during my care of the patient were reviewed by me and considered in my medical decision making (see chart for details).  Differential diagnosis includes, but is not limited to, ovarian cyst, ovarian torsion, acute appendicitis, diverticulitis, urinary tract infection/pyelonephritis, endometriosis, bowel obstruction, colitis, renal colic, gastroenteritis, hernia, fibroids, endometriosis, pregnancy related pain including ectopic pregnancy, etc. As part of my medical decision making, I reviewed the following data within the electronic MEDICAL RECORD NUMBER Old chart reviewed   ----------------------------------------- 3:03 PM on  06/16/2017 -----------------------------------------  Patient pending CAT scan as well as remaining blood work at this time.  Signed out to Dr. Mayford Knife. ____________________________________________   FINAL CLINICAL IMPRESSION(S) / ED DIAGNOSES  Final diagnoses:  Constipation, unspecified constipation type  Left sided abdominal pain      NEW MEDICATIONS STARTED DURING THIS VISIT:  New Prescriptions   No medications on file     Note:  This document was prepared using Dragon voice recognition software and may include unintentional dictation errors.     Myrna Blazer, MD 06/16/17 [phone]

## 2017-06-19 ENCOUNTER — Ambulatory Visit: Payer: BLUE CROSS/BLUE SHIELD | Admitting: Gastroenterology

## 2017-08-25 ENCOUNTER — Other Ambulatory Visit: Payer: Self-pay | Admitting: Physician Assistant

## 2017-08-25 DIAGNOSIS — E6609 Other obesity due to excess calories: Secondary | ICD-10-CM

## 2017-08-25 DIAGNOSIS — Z713 Dietary counseling and surveillance: Secondary | ICD-10-CM

## 2017-08-25 DIAGNOSIS — Z6831 Body mass index (BMI) 31.0-31.9, adult: Secondary | ICD-10-CM

## 2018-01-22 ENCOUNTER — Ambulatory Visit: Payer: BLUE CROSS/BLUE SHIELD | Admitting: Physician Assistant

## 2018-01-22 ENCOUNTER — Encounter: Payer: Self-pay | Admitting: Physician Assistant

## 2018-01-22 VITALS — BP 140/76 | HR 82 | Temp 98.7°F | Resp 16 | Wt 175.8 lb

## 2018-01-22 DIAGNOSIS — L02214 Cutaneous abscess of groin: Secondary | ICD-10-CM | POA: Diagnosis not present

## 2018-01-22 MED ORDER — SULFAMETHOXAZOLE-TRIMETHOPRIM 800-160 MG PO TABS: 1.0000 | ORAL_TABLET | Freq: Two times a day (BID) | ORAL | 0 refills | Status: AC

## 2018-01-22 NOTE — Patient Instructions (Signed)
Skin Abscess A skin abscess is an infected area on or under your skin that contains pus and other material. An abscess can happen almost anywhere on your body. Some abscesses break open (rupture) on their own. Most continue to get worse unless they are treated. The infection can spread deeper into the body and into your blood, which can make you feel sick. Treatment usually involves draining the abscess. Follow these instructions at home: Abscess Care  If you have an abscess that has not drained, place a warm, clean, wet washcloth over the abscess several times a day. Do this as told by your doctor.  Follow instructions from your doctor about how to take care of your abscess. Make sure you: ? Cover the abscess with a bandage (dressing). ? Change your bandage or gauze as told by your doctor. ? Wash your hands with soap and water before you change the bandage or gauze. If you cannot use soap and water, use hand sanitizer.  Check your abscess every day for signs that the infection is getting worse. Check for: ? More redness, swelling, or pain. ? More fluid or blood. ? Warmth. ? More pus or a bad smell. Medicines   Take over-the-counter and prescription medicines only as told by your doctor.  If you were prescribed an antibiotic medicine, take it as told by your doctor. Do not stop taking the antibiotic even if you start to feel better. General instructions  To avoid spreading the infection: ? Do not share personal care items, towels, or hot tubs with others. ? Avoid making skin-to-skin contact with other people.  Keep all follow-up visits as told by your doctor. This is important. Contact a doctor if:  You have more redness, swelling, or pain around your abscess.  You have more fluid or blood coming from your abscess.  Your abscess feels warm when you touch it.  You have more pus or a bad smell coming from your abscess.  You have a fever.  Your muscles ache.  You have  chills.  You feel sick. Get help right away if:  You have very bad (severe) pain.  You see red streaks on your skin spreading away from the abscess. This information is not intended to replace advice given to you by your health care provider. Make sure you discuss any questions you have with your health care provider. Document Released: 09/21/2007 Document Revised: 11/29/2015 Document Reviewed: 02/11/2015 Elsevier Interactive Patient Education  2018 [employer] Inc[employer]  

## 2018-01-22 NOTE — Progress Notes (Signed)
Patient: Bethany Edwards Female    DOB: 10/08/1980   37 y.o.   MRN: [id number] Visit Date: 01/24/2018  Today's Provider: Trey Sailors, PA-C   Chief Complaint  Patient presents with  . Rash   Subjective:    HPI   Patient here today c/o possible cyst on genital area, x's 2 weeks. Patient reports pain, denies any discharge. Patient has been using warm compress reports no help. Denies fevers, chills, nausea, vomiting.      Allergies  Allergen Reactions  . Penicillins Rash    Has patient had a PCN reaction causing immediate rash, facial/tongue/throat swelling, SOB or lightheadedness with hypotension: Unknown Has patient had a PCN reaction causing severe rash involving mucus membranes or skin necrosis: Unknown Has patient had a PCN reaction that required hospitalization: Unknown Has patient had a PCN reaction occurring within the last 10 years: No If all of the above answers are "NO", then may proceed with Cephalosporin use.      Current Outpatient Medications:  .  atorvastatin (LIPITOR) 10 MG tablet, Take 1 tablet (10 mg total) by mouth daily., Disp: 90 tablet, Rfl: 3 .  buPROPion (WELLBUTRIN SR) 150 MG 12 hr tablet, Take 1 tablet (150 mg total) by mouth 2 (two) times daily., Disp: 180 tablet, Rfl: 3 .  glipiZIDE (GLUCOTROL XL) 10 MG 24 hr tablet, Take 1 tablet (10 mg total) by mouth daily with breakfast., Disp: 90 tablet, Rfl: 3 .  metFORMIN (GLUCOPHAGE) 500 MG tablet, TAKE 1 TABLET (500 MG TOTAL) BY MOUTH 2 (TWO) TIMES DAILY WITH A MEAL., Disp: 180 tablet, Rfl: 3 .  ondansetron (ZOFRAN ODT) 4 MG disintegrating tablet, Take 1 tablet (4 mg total) by mouth every 8 (eight) hours as needed for nausea or vomiting., Disp: 20 tablet, Rfl: 0 .  furosemide (LASIX) 20 MG tablet, Take 1 tablet (20 mg total) by mouth daily. (Patient not taking: Reported on 01/22/2018), Disp: 90 tablet, Rfl: 3 .  sulfamethoxazole-trimethoprim (BACTRIM DS,SEPTRA DS) 800-160 MG tablet, Take 1 tablet by  mouth 2 (two) times daily for 7 days., Disp: 14 tablet, Rfl: 0  Review of Systems  Social History   Tobacco Use  . Smoking status: Current Every Day Smoker    Packs/day: 0.50  . Smokeless tobacco: Never Used  Substance Use Topics  . Alcohol use: No   Objective:   BP 140/76 (BP Location: Left Arm, Patient Position: Sitting, Cuff Size: Normal)   Pulse 82   Temp 98.7 F (37.1 C) (Oral)   Resp 16   Wt 175 lb 12.8 oz (79.7 kg)   SpO2 98%   BMI 31.64 kg/m  Vitals:   01/22/18 1551  BP: 140/76  Pulse: 82  Resp: 16  Temp: 98.7 F (37.1 C)  TempSrc: Oral  SpO2: 98%  Weight: 175 lb 12.8 oz (79.7 kg)     Physical Exam  Constitutional: She is oriented to person, place, and time. She appears well-developed and well-nourished.  Neurological: She is alert and oriented to person, place, and time.  Skin: Skin is warm and dry. There is erythema.     Psychiatric: She has a normal mood and affect. Her behavior is normal.        Assessment & Plan:     1. Groin abscess  Abx and warm compresses as below. Suspect this will auto drain and resolve.   - sulfamethoxazole-trimethoprim (BACTRIM DS,SEPTRA DS) 800-160 MG tablet; Take 1 tablet by mouth 2 (two) times  daily for 7 days.  Dispense: 14 tablet; Refill: 0  Return if symptoms worsen or fail to improve.  The entirety of the information documented in the History of Present Illness, Review of Systems and Physical Exam were personally obtained by me. Portions of this information were initially documented by Rondel Baton, CMA and reviewed by me for thoroughness and accuracy.         Trey Sailors, PA-C  [facility]

## 2018-01-24 ENCOUNTER — Other Ambulatory Visit: Payer: Self-pay | Admitting: Physician Assistant

## 2018-01-24 DIAGNOSIS — E119 Type 2 diabetes mellitus without complications: Secondary | ICD-10-CM

## 2018-03-07 ENCOUNTER — Ambulatory Visit (INDEPENDENT_AMBULATORY_CARE_PROVIDER_SITE_OTHER): Payer: BLUE CROSS/BLUE SHIELD | Admitting: Physician Assistant

## 2018-03-07 ENCOUNTER — Ambulatory Visit
Admission: RE | Admit: 2018-03-07 | Discharge: 2018-03-07 | Disposition: A | Discharge status: Home / Self Care | Payer: BLUE CROSS/BLUE SHIELD | Source: Ambulatory Visit | Attending: Physician Assistant | Admitting: Physician Assistant

## 2018-03-07 ENCOUNTER — Encounter: Payer: Self-pay | Admitting: Physician Assistant

## 2018-03-07 VITALS — BP 120/70 | HR 100 | Temp 98.8°F | Resp 16 | Wt 170.0 lb

## 2018-03-07 DIAGNOSIS — M25552 Pain in left hip: Secondary | ICD-10-CM

## 2018-03-07 DIAGNOSIS — E119 Type 2 diabetes mellitus without complications: Secondary | ICD-10-CM

## 2018-03-07 DIAGNOSIS — Z6830 Body mass index (BMI) 30.0-30.9, adult: Secondary | ICD-10-CM

## 2018-03-07 DIAGNOSIS — F17200 Nicotine dependence, unspecified, uncomplicated: Secondary | ICD-10-CM

## 2018-03-07 DIAGNOSIS — Z114 Encounter for screening for human immunodeficiency virus [HIV]: Secondary | ICD-10-CM

## 2018-03-07 DIAGNOSIS — R591 Generalized enlarged lymph nodes: Secondary | ICD-10-CM | POA: Diagnosis not present

## 2018-03-07 DIAGNOSIS — E7800 Pure hypercholesterolemia, unspecified: Secondary | ICD-10-CM

## 2018-03-07 DIAGNOSIS — E6609 Other obesity due to excess calories: Secondary | ICD-10-CM

## 2018-03-07 MED ORDER — DOXYCYCLINE HYCLATE 100 MG PO TABS: 100.0000 mg | ORAL_TABLET | Freq: Two times a day (BID) | ORAL | 0 refills | Status: DC

## 2018-03-07 MED ORDER — MELOXICAM 15 MG PO TABS: 15.0000 mg | ORAL_TABLET | Freq: Every day | ORAL | 0 refills | Status: DC

## 2018-03-07 MED ORDER — ATORVASTATIN CALCIUM 10 MG PO TABS: 10.0000 mg | ORAL_TABLET | Freq: Every day | ORAL | 3 refills | Status: DC

## 2018-03-07 MED ORDER — BUPROPION HCL ER (SR) 150 MG PO TB12: 150.0000 mg | ORAL_TABLET | Freq: Two times a day (BID) | ORAL | 3 refills | Status: DC

## 2018-03-07 NOTE — Progress Notes (Signed)
Patient: Bethany Edwards Female    DOB: December 12, 1980   37 y.o.   MRN: [id number] Visit Date: 03/07/2018  Today's Provider: Margaretann Loveless, PA-C   Chief Complaint  Patient presents with  . Leg Pain   Subjective:    Leg Pain   The incident occurred more than 1 week ago (2 months ago). There was no injury mechanism. The pain is present in the left leg and left hip. The quality of the pain is described as aching. The pain is mild. Associated symptoms include numbness ("little") and tingling ("a little"). Associated symptoms comments: Groin area hurting and lymph nodes swollen. She reports no foreign bodies present. Nothing aggravates the symptoms. She has tried acetaminophen and NSAIDs for the symptoms. The treatment provided no relief.      Allergies  Allergen Reactions  . Penicillins Rash    Has patient had a PCN reaction causing immediate rash, facial/tongue/throat swelling, SOB or lightheadedness with hypotension: Unknown Has patient had a PCN reaction causing severe rash involving mucus membranes or skin necrosis: Unknown Has patient had a PCN reaction that required hospitalization: Unknown Has patient had a PCN reaction occurring within the last 10 years: No If all of the above answers are "NO", then may proceed with Cephalosporin use.      Current Outpatient Medications:  .  atorvastatin (LIPITOR) 10 MG tablet, Take 1 tablet (10 mg total) by mouth daily., Disp: 90 tablet, Rfl: 3 .  buPROPion (WELLBUTRIN SR) 150 MG 12 hr tablet, Take 1 tablet (150 mg total) by mouth 2 (two) times daily., Disp: 180 tablet, Rfl: 3 .  glipiZIDE (GLUCOTROL XL) 10 MG 24 hr tablet, TAKE 1 TABLET(10 MG) BY MOUTH DAILY WITH BREAKFAST, Disp: 90 tablet, Rfl: 1 .  metFORMIN (GLUCOPHAGE) 500 MG tablet, TAKE 1 TABLET(500 MG) BY MOUTH TWICE DAILY WITH A MEAL, Disp: 180 tablet, Rfl: 1 .  furosemide (LASIX) 20 MG tablet, Take 1 tablet (20 mg total) by mouth daily. (Patient not taking: Reported on  01/22/2018), Disp: 90 tablet, Rfl: 3 .  ondansetron (ZOFRAN ODT) 4 MG disintegrating tablet, Take 1 tablet (4 mg total) by mouth every 8 (eight) hours as needed for nausea or vomiting. (Patient not taking: Reported on 03/07/2018), Disp: 20 tablet, Rfl: 0  Review of Systems  Constitutional: Negative.   Respiratory: Negative.   Cardiovascular: Negative.   Gastrointestinal: Negative.   Neurological: Positive for tingling ("a little") and numbness ("little").    Social History   Tobacco Use  . Smoking status: Current Every Day Smoker    Packs/day: 0.50  . Smokeless tobacco: Never Used  Substance Use Topics  . Alcohol use: No   Objective:   BP 120/70 (BP Location: Left Arm, Patient Position: Sitting, Cuff Size: Normal)   Pulse 100   Temp 98.8 F (37.1 C) (Oral)   Resp 16   Wt 170 lb (77.1 kg)   LMP 02/11/2018   BMI 30.60 kg/m  Vitals:   03/07/18 0821  BP: 120/70  Pulse: 100  Resp: 16  Temp: 98.8 F (37.1 C)  TempSrc: Oral  Weight: 170 lb (77.1 kg)     Physical Exam  Constitutional: She appears well-developed and well-nourished. No distress.  Neck: Normal range of motion. Neck supple.  Cardiovascular: Normal rate, regular rhythm and normal heart sounds. Exam reveals no gallop and no friction rub.  No murmur heard. Pulmonary/Chest: Effort normal and breath sounds normal. No respiratory distress. She has no wheezes. She  has no rales.  Musculoskeletal:       Left hip: She exhibits decreased range of motion, decreased strength, tenderness and swelling. She exhibits no bony tenderness.  Lymphadenopathy:       Left: Inguinal adenopathy present.  Skin: She is not diaphoretic.  Vitals reviewed.       Assessment & Plan:     1. Left hip pain Unsure of source but does have adenopathy of the inguinal lymph nodes on the left. Will check labs as below and f/u pending results. Will get imaging as below as well. Meloxicam sent in for inflammation. Doxycycline sent in for  lymphadenopathy. She is to call if symptoms worsen. If no improvement in lymphadenopathy with antibiotic we will proceed with US. She is in agreement.  - CBC w/Diff/Platelet - Comprehensive Metabolic Panel (CMET) - TSH - HgB A1c - DG HIP UNILAT WITH PELVIS MIN 4 VIEWS LEFT; Future - meloxicam (MOBIC) 15 MG tablet; Take 1 tablet (15 mg total) by mouth daily.  Dispense: 30 tablet; Refill: 0  2. Lymphadenopathy See above medical treatment plan. - CBC w/Diff/Platelet - Comprehensive Metabolic Panel (CMET) - TSH - HgB A1c - DG HIP UNILAT WITH PELVIS MIN 4 VIEWS LEFT; Future - doxycycline (VIBRA-TABS) 100 MG tablet; Take 1 tablet (100 mg total) by mouth 2 (two) times daily.  Dispense: 10 tablet; Refill: 0  3. Type 2 diabetes mellitus without complication, unspecified whether long term insulin use (HCC) Stable. Continue metformin and glipizide. Will check labs as below and f/u pending results. - CBC w/Diff/Platelet - Comprehensive Metabolic Panel (CMET) - TSH - HgB A1c  4. Hypercholesteremia Stable. Continue atorvastatin. Will check labs as below and f/u pending results. - CBC w/Diff/Platelet - Comprehensive Metabolic Panel (CMET) - TSH - HgB A1c - atorvastatin (LIPITOR) 10 MG tablet; Take 1 tablet (10 mg total) by mouth daily.  Dispense: 90 tablet; Refill: 3  5. Compulsive tobacco user syndrome Does not desire quitting at this time due to stressors (mom has pancreatic cancer, daughter and step daughter are both battling mental illness).  - CBC w/Diff/Platelet - Comprehensive Metabolic Panel (CMET) - TSH - HgB A1c - buPROPion (WELLBUTRIN SR) 150 MG 12 hr tablet; Take 1 tablet (150 mg total) by mouth 2 (two) times daily.  Dispense: 180 tablet; Refill: 3  6. Class 1 obesity due to excess calories with serious comorbidity and body mass index (BMI) of 30.0 to 30.9 in adult Counseled patient on healthy lifestyle modifications including dieting and exercise.   7. Screening for HIV  without presence of risk factors Will check labs as below and f/u pending results. - HIV antibody (with reflex)       Margaretann LovelessJennifer M Burnette, PA-C  [facility]

## 2018-03-07 NOTE — Patient Instructions (Signed)
Lymphadenopathy Lymphadenopathy refers to swollen or enlarged lymph glands, also called lymph nodes. Lymph glands are part of your body's defense (immune) system, which protects the body from infections, germs, and diseases. Lymph glands are found in many locations in your body, including the neck, underarm, and groin. Many things can cause lymph glands to become enlarged. When your immune system responds to germs, such as viruses or bacteria, infection-fighting cells and fluid build up. This causes the glands to grow in size. Usually, this is not something to worry about. The swelling and any soreness often go away without treatment. However, swollen lymph glands can also be caused by a number of diseases. Your health care provider may do various tests to help determine the cause. If the cause of your swollen lymph glands cannot be found, it is important to monitor your condition to make sure the swelling goes away. Follow these instructions at home: Watch your condition for any changes. The following actions may help to lessen any discomfort you are feeling:  Get plenty of rest.  Take medicines only as directed by your health care provider. Your health care provider may recommend over-the-counter medicines for pain.  Apply moist heat compresses to the site of swollen lymph nodes as directed by your health care provider. This can help reduce any pain.  Check your lymph nodes daily for any changes.  Keep all follow-up visits as directed by your health care provider. This is important.  Contact a health care provider if:  Your lymph nodes are still swollen after 2 weeks.  Your swelling increases or spreads to other areas.  Your lymph nodes are hard, seem fixed to the skin, or are growing rapidly.  Your skin over the lymph nodes is red and inflamed.  You have a fever.  You have chills.  You have fatigue.  You develop a sore throat.  You have abdominal pain.  You have weight  loss.  You have night sweats. Get help right away if:  You notice fluid leaking from the area of the enlarged lymph node.  You have severe pain in any area of your body.  You have chest pain.  You have shortness of breath. This information is not intended to replace advice given to you by your health care provider. Make sure you discuss any questions you have with your health care provider. Document Released: 01/12/2008 Document Revised: 09/10/2015 Document Reviewed: 11/07/2013 Elsevier Interactive Patient Education  2018 Elsevier Inc. Hip Pain The hip is the joint between the upper legs and the lower pelvis. The bones, cartilage, tendons, and muscles of your hip joint support your body and allow you to move around. Hip pain can range from a minor ache to severe pain in one or both of your hips. The pain may be felt on the inside of the hip joint near the groin, or the outside near the buttocks and upper thigh. You may also have swelling or stiffness. Follow these instructions at home: Managing pain, stiffness, and swelling  If directed, apply ice to the injured area. ? Put ice in a plastic bag. ? Place a towel between your skin and the bag. ? Leave the ice on for 20 minutes, 2-3 times a day  Sleep with a pillow between your legs on your most comfortable side.  Avoid any activities that cause pain. General instructions  Take over-the-counter and prescription medicines only as told by your health care provider.  Do any exercises as told by your health care  provider.  Record the following: ? How often you have hip pain. ? The location of your pain. ? What the pain feels like. ? What makes the pain worse.  Keep all follow-up visits as told by your health care provider. This is important. Contact a health care provider if:  You cannot put weight on your leg.  Your pain or swelling continues or gets worse after one week.  It gets harder to walk.  You have a fever. Get  help right away if:  You fall.  You have a sudden increase in pain and swelling in your hip.  Your hip is red or swollen or very tender to touch. Summary  Hip pain can range from a minor ache to severe pain in one or both of your hips.  The pain may be felt on the inside of the hip joint near the groin, or the outside near the buttocks and upper thigh.  Avoid any activities that cause pain.  Record how often you have hip pain, the location of the pain, what makes it worse and what it feels like. This information is not intended to replace advice given to you by your health care provider. Make sure you discuss any questions you have with your health care provider. Document Released: 09/22/2009 Document Revised: 03/07/2016 Document Reviewed: 03/07/2016 Elsevier Interactive Patient Education  [employer]2018 Elsevier Inc.

## 2018-03-08 LAB — CBC WITH DIFFERENTIAL/PLATELET
BASOS ABS: 0 10*3/uL (ref 0.0–0.2)
Basos: 1 %
EOS (ABSOLUTE): 0.2 10*3/uL (ref 0.0–0.4)
Eos: 2 %
HEMOGLOBIN: 13.2 g/dL (ref 11.1–15.9)
Hematocrit: 40.4 % (ref 34.0–46.6)
IMMATURE GRANS (ABS): 0 10*3/uL (ref 0.0–0.1)
Immature Granulocytes: 0 %
LYMPHS ABS: 2.3 10*3/uL (ref 0.7–3.1)
LYMPHS: 26 %
MCH: 28.8 pg (ref 26.6–33.0)
MCHC: 32.7 g/dL (ref 31.5–35.7)
MCV: 88 fL (ref 79–97)
MONOCYTES: 5 %
Monocytes Absolute: 0.5 10*3/uL (ref 0.1–0.9)
NEUTROS ABS: 5.9 10*3/uL (ref 1.4–7.0)
Neutrophils: 66 %
Platelets: 247 10*3/uL (ref 150–450)
RBC: 4.58 x10E6/uL (ref 3.77–5.28)
RDW: 13.1 % (ref 12.3–15.4)
WBC: 8.8 10*3/uL (ref 3.4–10.8)

## 2018-03-08 LAB — COMPREHENSIVE METABOLIC PANEL
A/G RATIO: 1.7 (ref 1.2–2.2)
ALT: 39 IU/L — ABNORMAL HIGH (ref 0–32)
AST: 16 IU/L (ref 0–40)
Albumin: 4.3 g/dL (ref 3.5–5.5)
Alkaline Phosphatase: 82 IU/L (ref 39–117)
BILIRUBIN TOTAL: 0.4 mg/dL (ref 0.0–1.2)
BUN / CREAT RATIO: 15 (ref 9–23)
BUN: 9 mg/dL (ref 6–20)
CHLORIDE: 102 mmol/L (ref 96–106)
CO2: 19 mmol/L — ABNORMAL LOW (ref 20–29)
Calcium: 8.9 mg/dL (ref 8.7–10.2)
Creatinine, Ser: 0.6 mg/dL (ref 0.57–1.00)
GFR calc non Af Amer: 115 mL/min/{1.73_m2} (ref >59)
GFR, EST AFRICAN AMERICAN: 133 mL/min/{1.73_m2} (ref >59)
Globulin, Total: 2.6 g/dL (ref 1.5–4.5)
Glucose: 295 mg/dL — ABNORMAL HIGH (ref 65–99)
POTASSIUM: 4.2 mmol/L (ref 3.5–5.2)
SODIUM: 137 mmol/L (ref 134–144)
TOTAL PROTEIN: 6.9 g/dL (ref 6.0–8.5)

## 2018-03-08 LAB — HEMOGLOBIN A1C
Est. average glucose Bld gHb Est-mCnc: 269 mg/dL
Hgb A1c MFr Bld: 11 % — ABNORMAL HIGH (ref 4.8–5.6)

## 2018-03-08 LAB — TSH: TSH: 1.22 u[IU]/mL (ref 0.450–4.500)

## 2018-03-08 LAB — HIV ANTIBODY (ROUTINE TESTING W REFLEX): HIV Screen 4th Generation wRfx: NONREACTIVE

## 2018-03-09 ENCOUNTER — Telehealth: Payer: Self-pay | Admitting: Physician Assistant

## 2018-03-09 DIAGNOSIS — R590 Localized enlarged lymph nodes: Secondary | ICD-10-CM

## 2018-03-09 NOTE — Telephone Encounter (Signed)
Pt calling to discuss recent labs. Please call pt back once labs are released.  Thanks, [employer]

## 2018-03-09 NOTE — Telephone Encounter (Signed)
Patient advised as directed below. Per patient she had run out of the medication and didn't have no more refills. Reports that she already started back. Patient scheduled follow-up appointment.  Patient is asking if she needs to continue with the antibiotic and since her blood count was normal and xray does she proceeds with US.

## 2018-03-09 NOTE — Telephone Encounter (Signed)
No she can keep taking antibiotic until completed to see if lymph nodes go down. If not we will then get US.

## 2018-03-09 NOTE — Telephone Encounter (Signed)
-----   Message from Margaretann LovelessJennifer M Burnette, [place]C sent at 03/09/2018  2:02 PM EST ----- Blood count is normal. Kidney function normal. Liver enzymes still borderline elevated but less than half of what it was last year. Thyroid normal. HIV screen normal. A1c way out of control. Up to 11.0. If not taking medications please restart metformin and glipizide. If taking I will need to add on another medication to help with glucose control. Would recommend to recheck in 4-6 weeks.

## 2018-03-12 NOTE — Telephone Encounter (Signed)
Patient advised. JER 

## 2018-03-12 NOTE — Telephone Encounter (Signed)
Ok I will order [place]

## 2018-03-12 NOTE — Telephone Encounter (Signed)
Patient states she has taking her last antibiotic. Patient also states that the lymph nodes has not gone down and she is still having pain.

## 2018-03-19 ENCOUNTER — Ambulatory Visit
Admission: RE | Admit: 2018-03-19 | Discharge: 2018-03-19 | Disposition: A | Discharge status: Home / Self Care | Payer: BLUE CROSS/BLUE SHIELD | Source: Ambulatory Visit | Attending: Physician Assistant | Admitting: Physician Assistant

## 2018-03-19 DIAGNOSIS — R599 Enlarged lymph nodes, unspecified: Secondary | ICD-10-CM | POA: Diagnosis not present

## 2018-03-19 DIAGNOSIS — R590 Localized enlarged lymph nodes: Secondary | ICD-10-CM | POA: Diagnosis not present

## 2018-03-21 ENCOUNTER — Telehealth: Payer: Self-pay

## 2018-03-21 DIAGNOSIS — R599 Enlarged lymph nodes, unspecified: Secondary | ICD-10-CM

## 2018-03-21 NOTE — Telephone Encounter (Signed)
-----   Message from Margaretann LovelessJennifer M Burnette, [place]-C sent at 03/21/2018  9:37 AM EST ----- Lymph nodes were visualized but do not appear abnormally enlarged or inflamed. Are you still having pain in the area?

## 2018-03-21 NOTE — Addendum Note (Signed)
Addended by: Margaretann LovelessBURNETTE, Soni Kegel M on: 03/21/2018 01:40 PM   Modules accepted: Orders

## 2018-03-21 NOTE — Telephone Encounter (Signed)
Patient states she is currently having the pain from her groin area to her leg and not  good range of motion. Patient states she does not want any pain medication. Patient wants to know if a MRI will show anything differently.

## 2018-03-21 NOTE — Telephone Encounter (Signed)
CT ordered. 

## 2018-03-21 NOTE — Telephone Encounter (Signed)
We would probably need a CT scan. If she is agreeable I will order

## 2018-03-21 NOTE — Telephone Encounter (Signed)
Patient advised as below. Patient agreed to CT scan. sd

## 2018-03-30 ENCOUNTER — Encounter: Payer: Self-pay | Admitting: Physician Assistant

## 2018-03-30 ENCOUNTER — Ambulatory Visit (INDEPENDENT_AMBULATORY_CARE_PROVIDER_SITE_OTHER): Payer: BLUE CROSS/BLUE SHIELD | Admitting: Physician Assistant

## 2018-03-30 VITALS — BP 127/78 | HR 101 | Temp 98.5°F | Resp 16 | Wt 172.0 lb

## 2018-03-30 DIAGNOSIS — E119 Type 2 diabetes mellitus without complications: Secondary | ICD-10-CM

## 2018-03-30 LAB — POCT GLYCOSYLATED HEMOGLOBIN (HGB A1C)
Est. average glucose Bld gHb Est-mCnc: 239
HEMOGLOBIN A1C: 9.8 % — AB (ref 4.0–5.6)

## 2018-03-30 NOTE — Progress Notes (Signed)
Patient: Bethany Edwards Female    DOB: [birth year]   37 y.o.   MRN: [id number] Visit Date: 03/30/2018  Today's Provider: Margaretann Loveless, PA-C   Chief Complaint  Patient presents with  . Follow-up   Subjective:     HPI  Follow up for diabetes  The patient was last seen for this 1 months ago. Changes made at last visit include checking A1c  She reports excellent compliance with treatment. She feels that condition is Improved. She is not having side effects.   Lab Results  Component Value Date   HGBA1C 9.8 (A) 03/30/2018  ------------------------------------------------------------------------------------    Allergies  Allergen Reactions  . Penicillins Rash    Has patient had a PCN reaction causing immediate rash, facial/tongue/throat swelling, SOB or lightheadedness with hypotension: Unknown Has patient had a PCN reaction causing severe rash involving mucus membranes or skin necrosis: Unknown Has patient had a PCN reaction that required hospitalization: Unknown Has patient had a PCN reaction occurring within the last 10 years: No If all of the above answers are "NO", then may proceed with Cephalosporin use.      Current Outpatient Medications:  .  atorvastatin (LIPITOR) 10 MG tablet, Take 1 tablet (10 mg total) by mouth daily., Disp: 90 tablet, Rfl: 3 .  buPROPion (WELLBUTRIN SR) 150 MG 12 hr tablet, Take 1 tablet (150 mg total) by mouth 2 (two) times daily., Disp: 180 tablet, Rfl: 3 .  doxycycline (VIBRA-TABS) 100 MG tablet, Take 1 tablet (100 mg total) by mouth 2 (two) times daily., Disp: 10 tablet, Rfl: 0 .  glipiZIDE (GLUCOTROL XL) 10 MG 24 hr tablet, TAKE 1 TABLET(10 MG) BY MOUTH DAILY WITH BREAKFAST, Disp: 90 tablet, Rfl: 1 .  meloxicam (MOBIC) 15 MG tablet, Take 1 tablet (15 mg total) by mouth daily., Disp: 30 tablet, Rfl: 0 .  metFORMIN (GLUCOPHAGE) 500 MG tablet, TAKE 1 TABLET(500 MG) BY MOUTH TWICE DAILY WITH A MEAL, Disp: 180 tablet, Rfl: 1 .   ondansetron (ZOFRAN ODT) 4 MG disintegrating tablet, Take 1 tablet (4 mg total) by mouth every 8 (eight) hours as needed for nausea or vomiting. (Patient not taking: Reported on 03/07/2018), Disp: 20 tablet, Rfl: 0  Review of Systems  Constitutional: Negative.   Respiratory: Negative.   Cardiovascular: Negative.   Gastrointestinal: Negative.   Musculoskeletal: Positive for arthralgias (left hip).  Neurological: Negative.   Hematological: Positive for adenopathy.    Social History   Tobacco Use  . Smoking status: Current Every Day Smoker    Packs/day: 0.50  . Smokeless tobacco: Never Used  Substance Use Topics  . Alcohol use: No      Objective:   BP 127/78 (BP Location: Left Arm, Patient Position: Sitting, Cuff Size: Normal)   Pulse (!) 101   Temp 98.5 F (36.9 C) (Oral)   Resp 16   Wt 172 lb (78 kg)   BMI 30.96 kg/m  Vitals:   03/30/18 0801  BP: 127/78  Pulse: (!) 101  Resp: 16  Temp: 98.5 F (36.9 C)  TempSrc: Oral  Weight: 172 lb (78 kg)     Physical Exam Vitals signs reviewed.  Constitutional:      General: She is not in acute distress.    Appearance: She is well-developed. She is not diaphoretic.  Neck:     Musculoskeletal: Normal range of motion and neck supple.     Thyroid: No thyromegaly.     Vascular: No JVD.  Trachea: No tracheal deviation.  Cardiovascular:     Rate and Rhythm: Normal rate and regular rhythm.     Heart sounds: Normal heart sounds. No murmur. No friction rub. No gallop.   Pulmonary:     Effort: Pulmonary effort is normal. No respiratory distress.     Breath sounds: Normal breath sounds. No wheezing or rales.  Lymphadenopathy:     Cervical: No cervical adenopathy.         Assessment & Plan    1. Type 2 diabetes mellitus without complication, unspecified whether long term insulin use (HCC) A1c improving, down to 9.8 from 11.0 in 4 weeks since restarting medications. I will see her back in 3 months to recheck.  - POCT  glycosylated hemoglobin (Hb A1C)     Margaretann LovelessJennifer M Keeghan Mcintire, PA-C  [facility]

## 2018-04-01 ENCOUNTER — Other Ambulatory Visit: Payer: Self-pay | Admitting: Physician Assistant

## 2018-04-01 DIAGNOSIS — M25552 Pain in left hip: Secondary | ICD-10-CM

## 2018-04-03 ENCOUNTER — Ambulatory Visit: Admission: RE | Admit: 2018-04-03 | Payer: BLUE CROSS/BLUE SHIELD | Source: Ambulatory Visit

## 2018-04-09 ENCOUNTER — Encounter: Payer: Self-pay | Admitting: Podiatry

## 2018-04-09 ENCOUNTER — Telehealth: Payer: Self-pay

## 2018-04-09 ENCOUNTER — Ambulatory Visit (INDEPENDENT_AMBULATORY_CARE_PROVIDER_SITE_OTHER): Payer: BLUE CROSS/BLUE SHIELD | Admitting: Podiatry

## 2018-04-09 ENCOUNTER — Ambulatory Visit
Admission: RE | Admit: 2018-04-09 | Discharge: 2018-04-09 | Disposition: A | Discharge status: Home / Self Care | Payer: BLUE CROSS/BLUE SHIELD | Source: Ambulatory Visit | Attending: Physician Assistant | Admitting: Physician Assistant

## 2018-04-09 VITALS — BP 132/76

## 2018-04-09 DIAGNOSIS — R140 Abdominal distension (gaseous): Secondary | ICD-10-CM | POA: Diagnosis not present

## 2018-04-09 DIAGNOSIS — L600 Ingrowing nail: Secondary | ICD-10-CM | POA: Diagnosis not present

## 2018-04-09 DIAGNOSIS — M5136 Other intervertebral disc degeneration, lumbar region: Secondary | ICD-10-CM

## 2018-04-09 DIAGNOSIS — R599 Enlarged lymph nodes, unspecified: Secondary | ICD-10-CM | POA: Insufficient documentation

## 2018-04-09 DIAGNOSIS — K5900 Constipation, unspecified: Secondary | ICD-10-CM | POA: Diagnosis not present

## 2018-04-09 DIAGNOSIS — M47817 Spondylosis without myelopathy or radiculopathy, lumbosacral region: Secondary | ICD-10-CM

## 2018-04-09 DIAGNOSIS — M5126 Other intervertebral disc displacement, lumbar region: Secondary | ICD-10-CM

## 2018-04-09 MED ORDER — NEOMYCIN-POLYMYXIN-HC 3.5-10000-1 OT SOLN: OTIC | 1 refills | Status: DC

## 2018-04-09 MED ORDER — NEOMYCIN-POLYMYXIN-HC 3.5-10000-1 OT SOLN: OTIC | 0 refills | Status: DC

## 2018-04-09 MED ORDER — IOPAMIDOL (ISOVUE-300) INJECTION 61%
100.0000 mL | Freq: Once | INTRAVENOUS | Status: AC | PRN
  Administered 2018-04-09: 100 mL via INTRAVENOUS

## 2018-04-09 NOTE — Telephone Encounter (Signed)
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 This could cause the pain down the left hip. Recommend ortho spine referral.

## 2018-04-09 NOTE — Patient Instructions (Signed)

## 2018-04-09 NOTE — Telephone Encounter (Signed)
Referral placed.

## 2018-04-09 NOTE — Telephone Encounter (Signed)
LMTCB

## 2018-04-09 NOTE — Telephone Encounter (Signed)
Patient would like to proceed with the Ortho Spine Referral.

## 2018-04-09 NOTE — Telephone Encounter (Signed)
Patient is returning a call to Joseline.

## 2018-04-10 NOTE — Progress Notes (Signed)
Subjective:   Patient ID: Bethany Edwards, female   DOB: 37 y.o.   MRN: [id number]   HPI Patient presents with chronic ingrown toenail deformity that are very sore and make shoe gear difficult she is tried to trim them herself and she does have diabetes but is under good control and she wants these nail borders removed permanently due to the pain she is experiencing and her inability to take care of herself.  Patient does smoke a half a pack per day and likes to be active   Review of Systems  All other systems reviewed and are negative.       Objective:  Physical Exam Vitals signs and nursing note reviewed.  Constitutional:      Appearance: She is well-developed.  Pulmonary:     Effort: Pulmonary effort is normal.  Musculoskeletal: Normal range of motion.  Skin:    General: Skin is warm.  Neurological:     Mental Status: She is alert.     Neurovascular status intact muscle strength is adequate range of motion within normal limits with patient found to have very painful lateral border right hallux medial border left hallux with incurvation of the beds and pain with palpation with no redness or active drainage noted     Assessment:  Ingrown toenail deformity hallux bilateral with lateral border right medial border left that are painful when pressed with no drainage no border and no indications of infection but appears to be strictly ingrown type components     Plan:  H&P condition reviewed and recommended removal of the nail border.  I explained procedure and risk and patient wants surgery and I explained the risk associated with this and allowed her to sign consent form.  I did go ahead today and I infiltrated the hallux bilateral 60 mg like Marcaine mixture sterile prep applied to the toes remove the lateral border right hallux medial border left hallux close matrix and applied phenol 3 applications 30 seconds followed by alcohol lavage sterile dressing.  Gave instructions on soaks  and applied sterile dressing and instructed on leaving the dressing on for 24 hours but to take it off early if she starts to throb.  Patient also had Corticosporin otic solution prescribed and was encouraged to call with any questions concerns will be checked back

## 2018-04-26 DIAGNOSIS — S76012A Strain of muscle, fascia and tendon of left hip, initial encounter: Secondary | ICD-10-CM | POA: Diagnosis not present

## 2018-10-16 ENCOUNTER — Other Ambulatory Visit: Payer: Self-pay | Admitting: Physician Assistant

## 2018-10-16 DIAGNOSIS — E119 Type 2 diabetes mellitus without complications: Secondary | ICD-10-CM

## 2018-12-04 DIAGNOSIS — M79642 Pain in left hand: Secondary | ICD-10-CM | POA: Diagnosis not present

## 2018-12-07 ENCOUNTER — Telehealth: Payer: Self-pay | Admitting: Physician Assistant

## 2018-12-07 NOTE — Telephone Encounter (Signed)
Pt called saying she needs to have her A1C and some other labs drawn.  She has been having low grade temps for 2 weeks, no other symptoms.  She says she feels fine and is working.  She had Covid back in May  She is tested every two weeks at [facility] where she works.  She wants to know how can she get her labs done.  She knows she can do a virtual visit.  Con Memos

## 2018-12-07 NOTE — Telephone Encounter (Signed)
Please advise 

## 2018-12-07 NOTE — Telephone Encounter (Signed)
When was her last covid test done through [facility]?

## 2018-12-14 ENCOUNTER — Ambulatory Visit (INDEPENDENT_AMBULATORY_CARE_PROVIDER_SITE_OTHER): Payer: BC Managed Care – PPO | Admitting: Physician Assistant

## 2018-12-14 DIAGNOSIS — E1165 Type 2 diabetes mellitus with hyperglycemia: Secondary | ICD-10-CM

## 2018-12-14 DIAGNOSIS — E7800 Pure hypercholesterolemia, unspecified: Secondary | ICD-10-CM

## 2018-12-14 DIAGNOSIS — R5383 Other fatigue: Secondary | ICD-10-CM

## 2018-12-14 NOTE — Telephone Encounter (Signed)
Patient called back office requesting lab order because she has been running a 'fever" of 99. Patient states that she was diagnosed with Covid-19 in May and states that her last test for Covid was on 12/04/2018. [facility]

## 2018-12-14 NOTE — Telephone Encounter (Signed)
She can just go to [facility] before 330. I do not order the test anny longer since they cancelled it every time I did

## 2018-12-14 NOTE — Progress Notes (Signed)
Virtual Visit via Telephone Note  I connected with Bethany Edwards on 12/14/18 at  3:40 PM EDT by telephone and verified that I am speaking with the correct person using two identifiers.  Location: Patient: Driving in car but stopped Provider: [facility]   I discussed the limitations, risks, security and privacy concerns of performing an evaluation and management service by telephone and the availability of in person appointments. I also discussed with the patient that there may be a patient responsible charge related to this service. The patient expressed understanding and agreed to proceed.   Mar Daring, PA-C   Subjective:    Patient ID: Bethany Edwards, female    DOB: 08/12/1980, 38 y.o.   MRN: [id number]  No chief complaint on file.   HPI Patient is in today for video visit to follow up her T2DM and HLD. She is also having some increased fatigue but unsure if that is from her T2DM being elevated or if it is from stress from work.   Past Medical History:  Diagnosis Date  . Depression   . Diabetes mellitus without complication ([place])    Type II  . Hyperlipidemia     Past Surgical History:  Procedure Laterality Date  . INCISION AND DRAINAGE ABSCESS Left 2003 and 2007   Axillary Abscess  . TUBAL LIGATION  2003    Family History  Adopted: Yes  Family history unknown: Yes    Social History   Socioeconomic History  . Marital status: Married    Spouse name: Not on file  . Number of children: Not on file  . Years of education: Not on file  . Highest education level: Not on file  Occupational History  . Not on file  Social Needs  . Financial resource strain: Not on file  . Food insecurity    Worry: Not on file    Inability: Not on file  . Transportation needs    Medical: Not on file    Non-medical: Not on file  Tobacco Use  . Smoking status: Current Every Day Smoker    Packs/day: 0.50  . Smokeless tobacco: Never Used  Substance and Sexual Activity  . Alcohol  use: No  . Drug use: No  . Sexual activity: Yes  Lifestyle  . Physical activity    Days per week: Not on file    Minutes per session: Not on file  . Stress: Not on file  Relationships  . Social Herbalist on phone: Not on file    Gets together: Not on file    Attends religious service: Not on file    Active member of club or organization: Not on file    Attends meetings of clubs or organizations: Not on file    Relationship status: Not on file  . Intimate partner violence    Fear of current or ex partner: Not on file    Emotionally abused: Not on file    Physically abused: Not on file    Forced sexual activity: Not on file  Other Topics Concern  . Not on file  Social History Narrative  . Not on file    Outpatient Medications Prior to Visit  Medication Sig Dispense Refill  . atorvastatin (LIPITOR) 10 MG tablet Take 1 tablet (10 mg total) by mouth daily. 90 tablet 3  . buPROPion (WELLBUTRIN SR) 150 MG 12 hr tablet Take 1 tablet (150 mg total) by mouth 2 (two) times daily. 180 tablet  3  . doxycycline (VIBRA-TABS) 100 MG tablet Take 1 tablet (100 mg total) by mouth 2 (two) times daily. 10 tablet 0  . glipiZIDE (GLUCOTROL XL) 10 MG 24 hr tablet TAKE 1 TABLET(10 MG) BY MOUTH DAILY WITH BREAKFAST 90 tablet 1  . meloxicam (MOBIC) 15 MG tablet TAKE 1 TABLET(15 MG) BY MOUTH DAILY 30 tablet 5  . metFORMIN (GLUCOPHAGE) 500 MG tablet TAKE 1 TABLET(500 MG) BY MOUTH TWICE DAILY WITH A MEAL 180 tablet 1  . neomycin-polymyxin-hydrocortisone (CORTISPORIN) OTIC solution Apply 1-2 drops to toe after soaking BID 10 mL 1  . neomycin-polymyxin-hydrocortisone (CORTISPORIN) OTIC solution Apply 1-2 drops to toe after soaking twice a day 10 mL 0  . ondansetron (ZOFRAN ODT) 4 MG disintegrating tablet Take 1 tablet (4 mg total) by mouth every 8 (eight) hours as needed for nausea or vomiting. 20 tablet 0   No facility-administered medications prior to visit.     Allergies  Allergen Reactions   . Penicillins Rash    Has patient had a PCN reaction causing immediate rash, facial/tongue/throat swelling, SOB or lightheadedness with hypotension: Unknown Has patient had a PCN reaction causing severe rash involving mucus membranes or skin necrosis: Unknown Has patient had a PCN reaction that required hospitalization: Unknown Has patient had a PCN reaction occurring within the last 10 years: No If all of the above answers are "NO", then may proceed with Cephalosporin use.     Review of Systems  Constitutional: Positive for malaise/fatigue.  HENT: Negative.   Respiratory: Negative.   Cardiovascular: Negative.   Skin: Negative.   Neurological: Negative.   Endo/Heme/Allergies: Negative for polydipsia.       Objective:    Physical Exam  Constitutional: She appears well-developed and well-nourished. No distress.  Pulmonary/Chest: Effort normal. No respiratory distress.  Psychiatric: She has a normal mood and affect. Her behavior is normal. Judgment and thought content normal.  Vitals reviewed.   There were no vitals taken for this visit. Wt Readings from Last 3 Encounters:  03/30/18 172 lb (78 kg)  03/07/18 170 lb (77.1 kg)  01/22/18 175 lb 12.8 oz (79.7 kg)    Health Maintenance Due  Topic Date Due  . OPHTHALMOLOGY EXAM  04/20/1988  . URINE MICROALBUMIN  04/20/1988  . HEMOGLOBIN A1C  09/29/2018  . INFLUENZA VACCINE  11/17/2018    There are no preventive care reminders to display for this patient.   Lab Results  Component Value Date   TSH 1.220 03/07/2018   Lab Results  Component Value Date   WBC 8.8 03/07/2018   HGB 13.2 03/07/2018   HCT 40.4 03/07/2018   MCV 88 03/07/2018   PLT 247 03/07/2018   Lab Results  Component Value Date   NA 137 03/07/2018   K 4.2 03/07/2018   CO2 19 (L) 03/07/2018   GLUCOSE 295 (H) 03/07/2018   BUN 9 03/07/2018   CREATININE 0.60 03/07/2018   BILITOT 0.4 03/07/2018   ALKPHOS 82 03/07/2018   AST 16 03/07/2018   ALT 39 (H)  03/07/2018   PROT 6.9 03/07/2018   ALBUMIN 4.3 03/07/2018   CALCIUM 8.9 03/07/2018   ANIONGAP 12 06/16/2017   Lab Results  Component Value Date   CHOL 208 (H) 01/20/2017   Lab Results  Component Value Date   HDL 25 (L) 01/20/2017   Lab Results  Component Value Date   LDLCALC 133 (H) 01/20/2017   Lab Results  Component Value Date   TRIG 350 (H) 01/20/2017   Lab  Results  Component Value Date   CHOLHDL 8.3 (H) 01/20/2017   Lab Results  Component Value Date   HGBA1C 9.8 (A) 03/30/2018       Assessment & Plan:   1. Type 2 diabetes mellitus with hyperglycemia, without long-term current use of insulin (HCC) Will check labs as below and will f/u pending results. Continue medications as prescribed for now and will adjust as needed. - CBC with Differential/Platelet - Comprehensive Metabolic Panel (CMET) - TSH - Lipid Profile - HgB A1c - Vitamin D (25 hydroxy)  2. Hypercholesteremia See above medical treatment plan. - CBC with Differential/Platelet - Comprehensive Metabolic Panel (CMET) - TSH - Lipid Profile - HgB A1c - Vitamin D (25 hydroxy)  3. Fatigue, unspecified type See above medical treatment plan. - CBC with Differential/Platelet - Comprehensive Metabolic Panel (CMET) - TSH - Lipid Profile - HgB A1c - Vitamin D (25 hydroxy)   I discussed the assessment and treatment plan with the patient. The patient was provided an opportunity to ask questions and all were answered. The patient agreed with the plan and demonstrated an understanding of the instructions.   The patient was advised to call back or seek an in-person evaluation if the symptoms worsen or if the condition fails to improve as anticipated.  I provided 14 minutes of non-face-to-face time during this encounter.   Margaretann LovelessJennifer M , PA-C

## 2018-12-14 NOTE — Telephone Encounter (Signed)
Patient requesting lab work done

## 2018-12-19 DIAGNOSIS — E7800 Pure hypercholesterolemia, unspecified: Secondary | ICD-10-CM | POA: Diagnosis not present

## 2018-12-19 DIAGNOSIS — R5383 Other fatigue: Secondary | ICD-10-CM | POA: Diagnosis not present

## 2018-12-19 DIAGNOSIS — E1165 Type 2 diabetes mellitus with hyperglycemia: Secondary | ICD-10-CM | POA: Diagnosis not present

## 2018-12-20 ENCOUNTER — Telehealth: Payer: Self-pay | Admitting: *Deleted

## 2018-12-20 ENCOUNTER — Telehealth: Payer: Self-pay

## 2018-12-20 DIAGNOSIS — R2232 Localized swelling, mass and lump, left upper limb: Secondary | ICD-10-CM

## 2018-12-20 DIAGNOSIS — E1165 Type 2 diabetes mellitus with hyperglycemia: Secondary | ICD-10-CM

## 2018-12-20 LAB — CBC WITH DIFFERENTIAL/PLATELET
Basophils Absolute: 0 x10E3/uL (ref 0.0–0.2)
Basos: 0 %
EOS (ABSOLUTE): 0.2 x10E3/uL (ref 0.0–0.4)
Eos: 2 %
Hematocrit: 40.2 % (ref 34.0–46.6)
Hemoglobin: 13.4 g/dL (ref 11.1–15.9)
Immature Grans (Abs): 0 x10E3/uL (ref 0.0–0.1)
Immature Granulocytes: 0 %
Lymphocytes Absolute: 2.5 x10E3/uL (ref 0.7–3.1)
Lymphs: 29 %
MCH: 29.8 pg (ref 26.6–33.0)
MCHC: 33.3 g/dL (ref 31.5–35.7)
MCV: 90 fL (ref 79–97)
Monocytes Absolute: 0.5 x10E3/uL (ref 0.1–0.9)
Monocytes: 6 %
Neutrophils Absolute: 5.6 x10E3/uL (ref 1.4–7.0)
Neutrophils: 63 %
Platelets: 244 x10E3/uL (ref 150–450)
RBC: 4.49 x10E6/uL (ref 3.77–5.28)
RDW: 13.9 % (ref 11.7–15.4)
WBC: 8.9 x10E3/uL (ref 3.4–10.8)

## 2018-12-20 LAB — COMPREHENSIVE METABOLIC PANEL WITH GFR
ALT: 21 IU/L (ref 0–32)
AST: 12 IU/L (ref 0–40)
Albumin/Globulin Ratio: 1.8 (ref 1.2–2.2)
Albumin: 4.4 g/dL (ref 3.8–4.8)
Alkaline Phosphatase: 67 IU/L (ref 39–117)
BUN/Creatinine Ratio: 10 (ref 9–23)
BUN: 7 mg/dL (ref 6–24)
Bilirubin Total: 0.2 mg/dL (ref 0.0–1.2)
CO2: 24 mmol/L (ref 20–29)
Calcium: 9.4 mg/dL (ref 8.7–10.2)
Chloride: 102 mmol/L (ref 96–106)
Creatinine, Ser: 0.7 mg/dL (ref 0.57–1.00)
GFR calc Af Amer: 125 mL/min/1.73 (ref >59)
GFR calc non Af Amer: 109 mL/min/1.73 (ref >59)
Globulin, Total: 2.5 g/dL (ref 1.5–4.5)
Glucose: 164 mg/dL — ABNORMAL HIGH (ref 65–99)
Potassium: 5.4 mmol/L — ABNORMAL HIGH (ref 3.5–5.2)
Sodium: 137 mmol/L (ref 134–144)
Total Protein: 6.9 g/dL (ref 6.0–8.5)

## 2018-12-20 LAB — LIPID PANEL
Chol/HDL Ratio: 9.4 ratio — ABNORMAL HIGH (ref 0.0–4.4)
Cholesterol, Total: 254 mg/dL — ABNORMAL HIGH (ref 100–199)
HDL: 27 mg/dL — ABNORMAL LOW (ref >39)
LDL Chol Calc (NIH): 175 mg/dL — ABNORMAL HIGH (ref 0–99)
Triglycerides: 272 mg/dL — ABNORMAL HIGH (ref 0–149)
VLDL Cholesterol Cal: 52 mg/dL — ABNORMAL HIGH (ref 5–40)

## 2018-12-20 LAB — HEMOGLOBIN A1C
Est. average glucose Bld gHb Est-mCnc: 203 mg/dL
Hgb A1c MFr Bld: 8.7 % — ABNORMAL HIGH (ref 4.8–5.6)

## 2018-12-20 LAB — TSH: TSH: 1.63 u[IU]/mL (ref 0.450–4.500)

## 2018-12-20 LAB — VITAMIN D 25 HYDROXY (VIT D DEFICIENCY, FRACTURES): Vit D, 25-Hydroxy: 23.7 ng/mL — ABNORMAL LOW (ref 30.0–100.0)

## 2018-12-20 MED ORDER — TRULICITY 0.75 MG/0.5ML ~~LOC~~ SOAJ: 0.7500 mg | SUBCUTANEOUS | 5 refills | Status: DC

## 2018-12-20 NOTE — Telephone Encounter (Signed)
US ordered

## 2018-12-20 NOTE — Telephone Encounter (Signed)
-----   Message from Mar Daring, [place] sent at 12/20/2018  8:33 AM EDT ----- Blood count is normal. Kidney and liver function are normal. Potassium is borderline high. May need to monitor potassium rich foods in diet and any supplements you may be taking. Thyroid is normal. Cholesterol increased from last year. Are you taking atorvastatin? If so we may need to increase to 20mg  from 10mg . A1c is at 8.7, down from 9.8 last check. Vit D is low at 23.7. Recommend taking OTC Vit D 2000 IU daily.

## 2018-12-20 NOTE — Telephone Encounter (Signed)
Patient called to speak with Sharyn Lull. She says she forgot to mention that she wants an ultrasound on her left hand. She feels like she may have a blood clot in her hand.

## 2018-12-20 NOTE — Telephone Encounter (Signed)
Patient was advised.  

## 2018-12-20 NOTE — Telephone Encounter (Signed)
Patient was notified of results. Patient stated that she has not been taking the atorvastatin, but she will start back on it. Patient also asked about starting Trulicity? Please advise?

## 2018-12-20 NOTE — Telephone Encounter (Signed)
Patient states she has a big quarter-sized knot on the top of her left hand. Patient states that she has pain in her hand that starts at her wrist. Patient also has swelling and tingling sensation. Patient states her ring and pinky fingers are numb. Patient states her left hand has been like this for about 3 weeks. She has been seen at urgent care and had x-ray done which was normal. Patient is requesting an [place]. She is willing to do a virtual visit if she needs to. Please advise?

## 2018-12-20 NOTE — Telephone Encounter (Signed)
Advised 

## 2018-12-20 NOTE — Telephone Encounter (Signed)
Yes we can do that. I will send in Rx.

## 2018-12-25 ENCOUNTER — Other Ambulatory Visit: Payer: Self-pay

## 2018-12-25 ENCOUNTER — Ambulatory Visit
Admission: RE | Admit: 2018-12-25 | Discharge: 2018-12-25 | Disposition: A | Discharge status: Home / Self Care | Payer: BC Managed Care – PPO | Source: Ambulatory Visit | Attending: Physician Assistant | Admitting: Physician Assistant

## 2018-12-25 DIAGNOSIS — R2232 Localized swelling, mass and lump, left upper limb: Secondary | ICD-10-CM | POA: Insufficient documentation

## 2018-12-26 ENCOUNTER — Telehealth: Payer: Self-pay | Admitting: Physician Assistant

## 2018-12-26 ENCOUNTER — Telehealth: Payer: Self-pay

## 2018-12-26 NOTE — Telephone Encounter (Signed)
Its not a blood clot. And unfortunately it isnt localized swelling so it isn't like we can stick a needle to aspirate unfortunately. I wish there was an easy fix for sure.

## 2018-12-26 NOTE — Telephone Encounter (Signed)
Patient reports that she has been putting ice on it and using NSAIDS and it has been for a month since she went to the Urgent care for the same thing and that they were concern of a blood clot at that time which it doesn't show that. Reports that she is just going to continue doing the what she has been doing already and if she sees any changes that she will call us back.

## 2018-12-26 NOTE — Telephone Encounter (Signed)
Patient reports no cut or on jury. She is asking how can she treat this?

## 2018-12-26 NOTE — Telephone Encounter (Signed)
Pt advised.   Thanks,   -Candies Palm  

## 2018-12-26 NOTE — Telephone Encounter (Signed)
-----   Message from Mar Daring, [place] sent at 12/26/2018 10:49 AM EDT ----- So there is no localized area/cyst of swelling, but there is an area noted of fluid that is most likely either subcutaneous edema or phlegmon (this would be more infectious/pus filled area). Have you had any injury or cut to the hand that could cause swelling or infection?

## 2018-12-26 NOTE — Telephone Encounter (Signed)
Well if it is soft tissue, she is already on meloxicam. So using NSAIDs can help. She can also use ice on her hand to try to reabsorb.

## 2018-12-26 NOTE — Telephone Encounter (Signed)
Calling for results on yesterday's ultra sound.  Please call her back when ready.  Thanks, [employer]

## 2018-12-26 NOTE — Telephone Encounter (Signed)
Patient advised.

## 2018-12-28 ENCOUNTER — Encounter: Payer: Self-pay | Admitting: Physician Assistant

## 2018-12-31 ENCOUNTER — Other Ambulatory Visit: Payer: Self-pay | Admitting: Physician Assistant

## 2018-12-31 DIAGNOSIS — E119 Type 2 diabetes mellitus without complications: Secondary | ICD-10-CM

## 2019-01-15 NOTE — Progress Notes (Signed)
Patient: Bethany Edwards Female    DOB: [birth year]   38 y.o.   MRN: [id number] Visit Date: 01/16/2019  Today's Provider: Mar Daring, PA-C   No chief complaint on file.  Subjective:    Virtual Visit via Telephone Note  I connected with Bethany Edwards on 01/16/19 at  8:20 AM EDT by telephone and verified that I am speaking with the correct person using two identifiers.  Location: Patient: work Provider: [facility]   I discussed the limitations, risks, security and privacy concerns of performing an evaluation and management service by telephone and the availability of in person appointments. I also discussed with the patient that there may be a patient responsible charge related to this service. The patient expressed understanding and agreed to proceed.   HPI  Patient c/o possible side effect from the Trulicity, she has been on this medicine for a month. She feels nauseous  dizzy, cannot eat red meat. She can only eat chicken. Fried foods smell bad to her. Reports this started when she started the Trulicity. Symptoms are worst right after taking the shot (she does Saturday mornings). Symptoms gradually lessen and by Thursday are almost gone, but then she does the injection again and they return.   Allergies  Allergen Reactions  . Penicillins Rash    Has patient had a PCN reaction causing immediate rash, facial/tongue/throat swelling, SOB or lightheadedness with hypotension: Unknown Has patient had a PCN reaction causing severe rash involving mucus membranes or skin necrosis: Unknown Has patient had a PCN reaction that required hospitalization: Unknown Has patient had a PCN reaction occurring within the last 10 years: No If all of the above answers are "NO", then may proceed with Cephalosporin use.      Current Outpatient Medications:  .  atorvastatin (LIPITOR) 10 MG tablet, Take 1 tablet (10 mg total) by mouth daily., Disp: 90 tablet, Rfl: 3 .  buPROPion (WELLBUTRIN SR) 150  MG 12 hr tablet, Take 1 tablet (150 mg total) by mouth 2 (two) times daily., Disp: 180 tablet, Rfl: 3 .  Dulaglutide (TRULICITY) 8.65 HQ/4.6NG SOPN, Inject 0.75 mg into the skin once a week., Disp: 4 pen, Rfl: 5 .  glipiZIDE (GLUCOTROL XL) 10 MG 24 hr tablet, TAKE 1 TABLET(10 MG) BY MOUTH DAILY WITH BREAKFAST, Disp: 90 tablet, Rfl: 1 .  meloxicam (MOBIC) 15 MG tablet, TAKE 1 TABLET(15 MG) BY MOUTH DAILY, Disp: 30 tablet, Rfl: 5 .  metFORMIN (GLUCOPHAGE) 500 MG tablet, TAKE 1 TABLET(500 MG) BY MOUTH TWICE DAILY WITH A MEAL, Disp: 180 tablet, Rfl: 1 .  ondansetron (ZOFRAN ODT) 4 MG disintegrating tablet, Take 1 tablet (4 mg total) by mouth every 8 (eight) hours as needed for nausea or vomiting., Disp: 20 tablet, Rfl: 0 .  neomycin-polymyxin-hydrocortisone (CORTISPORIN) OTIC solution, Apply 1-2 drops to toe after soaking BID (Patient not taking: Reported on 01/16/2019), Disp: 10 mL, Rfl: 1 .  neomycin-polymyxin-hydrocortisone (CORTISPORIN) OTIC solution, Apply 1-2 drops to toe after soaking twice a day (Patient not taking: Reported on 01/16/2019), Disp: 10 mL, Rfl: 0  Review of Systems  Constitutional: Positive for appetite change.  HENT: Negative.   Respiratory: Negative.   Gastrointestinal: Positive for abdominal distention, nausea and vomiting (once but she reports she ate something she shouldnt have).  Neurological: Positive for dizziness.    Social History   Tobacco Use  . Smoking status: Current Every Day Smoker    Packs/day: 0.50  . Smokeless tobacco: Never Used  Substance Use Topics  . Alcohol use: No      Objective:   BP 120/64 (BP Location: Right Arm, Patient Position: Sitting, Cuff Size: Normal)   Pulse 74   Temp 99.8 F (37.7 C) (Temporal)   Wt 162 lb (73.5 kg)   BMI 29.16 kg/m  Vitals:   01/16/19 0823  BP: 120/64  Pulse: 74  Temp: 99.8 F (37.7 C)  TempSrc: Temporal  Weight: 162 lb (73.5 kg)  Body mass index is 29.16 kg/m.   Physical Exam Vitals signs  reviewed.  Constitutional:      General: She is not in acute distress.    Appearance: Normal appearance.  Pulmonary:     Effort: Pulmonary effort is normal. No respiratory distress.  Neurological:     Mental Status: She is alert.      No results found for any visits on 01/16/19.     Assessment & Plan    1. Type 2 diabetes mellitus with hyperglycemia, without long-term current use of insulin (HCC) Stop trulicity due to side effects. Start Ozempic low dose as below. Call if side effects occur. Will recheck A1c in 2 months. Continue Metformin and Glipizide for now. If A1c improved at f/u may discontinue glipizide. She can hold glipizide as well when fasting glucose readings are less than 100. Return in 2 months.  - Semaglutide,0.25 or 0.5MG /DOS, (OZEMPIC, 0.25 OR 0.5 MG/DOSE,) 2 MG/1.5ML SOPN; Inject 0.5 mg into the skin once a week.  Dispense: 1 pen; Refill: 4  2. BMI 29.0-29.9,adult Counseled patient on healthy lifestyle modifications including dieting and exercise.    3. Mass of left hand Noted on dorsal surface of the left hand. Has had xrays and [place]. Patient reports still present and causes aching. I have not personally evaluated it because she has only done telephone visits with me. Was evaluated by Urgent care. Referral to hand surgeon placed as below.  - Ambulatory referral to Orthopedic Surgery   I discussed the assessment and treatment plan with the patient. The patient was provided an opportunity to ask questions and all were answered. The patient agreed with the plan and demonstrated an understanding of the instructions.   The patient was advised to call back or seek an in-person evaluation if the symptoms worsen or if the condition fails to improve as anticipated.  I provided 15 minutes of non-face-to-face time during this encounter.     Margaretann Loveless, PA-C  [facility]

## 2019-01-16 ENCOUNTER — Other Ambulatory Visit: Payer: Self-pay | Admitting: Physician Assistant

## 2019-01-16 ENCOUNTER — Other Ambulatory Visit: Payer: Self-pay

## 2019-01-16 ENCOUNTER — Encounter: Payer: Self-pay | Admitting: Physician Assistant

## 2019-01-16 ENCOUNTER — Ambulatory Visit (INDEPENDENT_AMBULATORY_CARE_PROVIDER_SITE_OTHER): Payer: BC Managed Care – PPO | Admitting: Physician Assistant

## 2019-01-16 VITALS — BP 120/64 | HR 74 | Temp 99.8°F | Wt 162.0 lb

## 2019-01-16 DIAGNOSIS — R2232 Localized swelling, mass and lump, left upper limb: Secondary | ICD-10-CM | POA: Diagnosis not present

## 2019-01-16 DIAGNOSIS — Z6829 Body mass index (BMI) 29.0-29.9, adult: Secondary | ICD-10-CM | POA: Diagnosis not present

## 2019-01-16 DIAGNOSIS — E1165 Type 2 diabetes mellitus with hyperglycemia: Secondary | ICD-10-CM

## 2019-01-16 DIAGNOSIS — E119 Type 2 diabetes mellitus without complications: Secondary | ICD-10-CM

## 2019-01-16 MED ORDER — OZEMPIC (0.25 OR 0.5 MG/DOSE) 2 MG/1.5ML ~~LOC~~ SOPN: 0.5000 mg | PEN_INJECTOR | SUBCUTANEOUS | 4 refills | Status: DC

## 2019-02-05 DIAGNOSIS — R2232 Localized swelling, mass and lump, left upper limb: Secondary | ICD-10-CM | POA: Diagnosis not present

## 2019-06-14 ENCOUNTER — Other Ambulatory Visit: Payer: Self-pay | Admitting: Physician Assistant

## 2019-06-14 DIAGNOSIS — E1165 Type 2 diabetes mellitus with hyperglycemia: Secondary | ICD-10-CM

## 2019-06-14 NOTE — Telephone Encounter (Signed)
Needs T2DM f/u in March please

## 2019-06-14 NOTE — Telephone Encounter (Signed)
Lmtcb to schedule an appointment to follow up on T2DM.

## 2019-06-14 NOTE — Telephone Encounter (Signed)
Pt needs an office visit

## 2019-06-14 NOTE — Telephone Encounter (Signed)
Requested medications are due for refill today? Yes   Requested medications are on active medication list? Yes  Last Refill:   01/16/2019  # 1 pen with 4 refills  Future visit scheduled?  No  Notes to Clinic:  Failed protocol.  Patient needs office visit.

## 2019-06-14 NOTE — Telephone Encounter (Signed)
A1C 8.7 on 12/19/18 and has no F/U OV.

## 2019-07-14 ENCOUNTER — Other Ambulatory Visit: Payer: Self-pay | Admitting: Physician Assistant

## 2019-07-14 DIAGNOSIS — E119 Type 2 diabetes mellitus without complications: Secondary | ICD-10-CM

## 2019-07-14 NOTE — Telephone Encounter (Signed)
Requested Prescriptions  Pending Prescriptions Disp Refills  . glipiZIDE (GLUCOTROL XL) 10 MG 24 hr tablet [Pharmacy Med Name: GLIPIZIDE ER 10MG  TABLETS] 90 tablet 0    Sig: TAKE 1 TABLET(10 MG) BY MOUTH DAILY WITH BREAKFAST     Endocrinology:  Diabetes - Sulfonylureas Failed - 07/14/2019  3:43 AM      Failed - HBA1C is between 0 and 7.9 and within 180 days    Hgb A1c MFr Bld  Date Value Ref Range Status  12/19/2018 8.7 (H) 4.8 - 5.6 % Final    Comment:             Prediabetes: 5.7 - 6.4          Diabetes: >6.4          Glycemic control for adults with diabetes: <7.0          Failed - Valid encounter within last 6 months    Recent Outpatient Visits          5 months ago Type 2 diabetes mellitus with hyperglycemia, without long-term current use of insulin [facility])   [facility] [place], [place], PA-C   7 months ago Type 2 diabetes mellitus with hyperglycemia, without long-term current use of insulin [facility])   [facility] [place], [place], [place]   1 year ago Type 2 diabetes mellitus without complication, unspecified whether long term insulin use [facility])   [facility] [place], [place], [place]   1 year ago Left hip pain   [facility] [place], [place], [place]   1 year ago Groin abscess   [facility] [place], [place], [place]

## 2019-08-26 ENCOUNTER — Ambulatory Visit (INDEPENDENT_AMBULATORY_CARE_PROVIDER_SITE_OTHER): Payer: BC Managed Care – PPO | Admitting: Physician Assistant

## 2019-08-26 ENCOUNTER — Inpatient Hospital Stay (HOSPITAL_COMMUNITY): Admit: 2019-08-26 | Payer: BC Managed Care – PPO

## 2019-08-26 ENCOUNTER — Other Ambulatory Visit: Payer: Self-pay

## 2019-08-26 ENCOUNTER — Encounter: Payer: Self-pay | Admitting: Physician Assistant

## 2019-08-26 VITALS — BP 125/83 | HR 89 | Temp 97.1°F | Ht 63.0 in | Wt 160.0 lb

## 2019-08-26 DIAGNOSIS — Z0000 Encounter for general adult medical examination without abnormal findings: Secondary | ICD-10-CM | POA: Diagnosis not present

## 2019-08-26 DIAGNOSIS — Z124 Encounter for screening for malignant neoplasm of cervix: Secondary | ICD-10-CM | POA: Diagnosis not present

## 2019-08-26 DIAGNOSIS — L918 Other hypertrophic disorders of the skin: Secondary | ICD-10-CM | POA: Diagnosis not present

## 2019-08-26 DIAGNOSIS — E7800 Pure hypercholesterolemia, unspecified: Secondary | ICD-10-CM

## 2019-08-26 DIAGNOSIS — F17200 Nicotine dependence, unspecified, uncomplicated: Secondary | ICD-10-CM

## 2019-08-26 DIAGNOSIS — N926 Irregular menstruation, unspecified: Secondary | ICD-10-CM | POA: Diagnosis not present

## 2019-08-26 DIAGNOSIS — Z1239 Encounter for other screening for malignant neoplasm of breast: Secondary | ICD-10-CM

## 2019-08-26 DIAGNOSIS — Z1329 Encounter for screening for other suspected endocrine disorder: Secondary | ICD-10-CM

## 2019-08-26 DIAGNOSIS — E559 Vitamin D deficiency, unspecified: Secondary | ICD-10-CM

## 2019-08-26 DIAGNOSIS — E1165 Type 2 diabetes mellitus with hyperglycemia: Secondary | ICD-10-CM | POA: Diagnosis not present

## 2019-08-26 NOTE — Progress Notes (Signed)
Complete physical exam   Patient: Bethany Edwards   DOB: 24-Oct-1980   39 y.o. Female  MRN: [id number] Visit Date: 08/26/2019  Today's healthcare provider: Margaretann Loveless, PA-C   No chief complaint on file.  Subjective    Bethany Edwards is a 39 y.o. female who presents today for a complete physical exam.  She reports consuming a general diet. The patient does not participate in regular exercise at present. She generally feels well. She reports sleeping well. She does have additional problems to discuss today.  HPI    Past Medical History:  Diagnosis Date  . Depression   . Diabetes mellitus without complication (HCC)    Type II  . Hyperlipidemia    Past Surgical History:  Procedure Laterality Date  . INCISION AND DRAINAGE ABSCESS Left 2003 and 2007   Axillary Abscess  . TUBAL LIGATION  2003   Social History   Socioeconomic History  . Marital status: Married    Spouse name: Not on file  . Number of children: Not on file  . Years of education: Not on file  . Highest education level: Not on file  Occupational History  . Not on file  Tobacco Use  . Smoking status: Current Every Day Smoker    Packs/day: 0.50  . Smokeless tobacco: Never Used  Substance and Sexual Activity  . Alcohol use: No  . Drug use: No  . Sexual activity: Yes  Other Topics Concern  . Not on file  Social History Narrative  . Not on file   Social Determinants of Health   Financial Resource Strain:   . Difficulty of Paying Living Expenses:   Food Insecurity:   . Worried About Programme researcher, broadcasting/film/video in the Last Year:   . Barista in the Last Year:   Transportation Needs:   . Freight forwarder (Medical):   Marland Kitchen Lack of Transportation (Non-Medical):   Physical Activity:   . Days of Exercise per Week:   . Minutes of Exercise per Session:   Stress:   . Feeling of Stress :   Social Connections:   . Frequency of Communication with Friends and Family:   . Frequency of Social  Gatherings with Friends and Family:   . Attends Religious Services:   . Active Member of Clubs or Organizations:   . Attends Banker Meetings:   Marland Kitchen Marital Status:   Intimate Partner Violence:   . Fear of Current or Ex-Partner:   . Emotionally Abused:   Marland Kitchen Physically Abused:   . Sexually Abused:    No family status information on file.   Family History  Adopted: Yes  Family history unknown: Yes   Allergies  Allergen Reactions  . Penicillins Rash    Has patient had a PCN reaction causing immediate rash, facial/tongue/throat swelling, SOB or lightheadedness with hypotension: Unknown Has patient had a PCN reaction causing severe rash involving mucus membranes or skin necrosis: Unknown Has patient had a PCN reaction that required hospitalization: Unknown Has patient had a PCN reaction occurring within the last 10 years: No If all of the above answers are "NO", then may proceed with Cephalosporin use.     Patient Care Team: Reine Just as PCP - General (Family Medicine)   Medications: Outpatient Medications Prior to Visit  Medication Sig  . atorvastatin (LIPITOR) 10 MG tablet Take 1 tablet (10 mg total) by mouth daily.  Marland Kitchen buPROPion [facility] SR) 150  MG 12 hr tablet Take 1 tablet (150 mg total) by mouth 2 (two) times daily.  Marland Kitchen glipiZIDE (GLUCOTROL XL) 10 MG 24 hr tablet TAKE 1 TABLET(10 MG) BY MOUTH DAILY WITH BREAKFAST  . meloxicam (MOBIC) 15 MG tablet TAKE 1 TABLET(15 MG) BY MOUTH DAILY  . metFORMIN (GLUCOPHAGE) 500 MG tablet TAKE 1 TABLET(500 MG) BY MOUTH TWICE DAILY WITH A MEAL  . neomycin-polymyxin-hydrocortisone (CORTISPORIN) OTIC solution Apply 1-2 drops to toe after soaking BID (Patient not taking: Reported on 01/16/2019)  . neomycin-polymyxin-hydrocortisone (CORTISPORIN) OTIC solution Apply 1-2 drops to toe after soaking twice a day (Patient not taking: Reported on 01/16/2019)  . ondansetron (ZOFRAN ODT) 4 MG disintegrating tablet Take 1 tablet (4  mg total) by mouth every 8 (eight) hours as needed for nausea or vomiting.  Marland Kitchen OZEMPIC, 0.25 OR 0.5 MG/DOSE, 2 MG/1.5ML SOPN INJECT 0.5 MG UNDER THE SKIN ONCE A WEEK   No facility-administered medications prior to visit.    Review of Systems  Constitutional: Negative.   HENT: Negative.   Eyes: Negative.   Respiratory: Negative.   Cardiovascular: Negative.   Gastrointestinal: Negative.   Endocrine: Negative.   Genitourinary: Negative.   Musculoskeletal: Negative.   Skin: Negative.   Allergic/Immunologic: Negative.   Neurological: Positive for headaches. Negative for dizziness, tremors, seizures, syncope, facial asymmetry, speech difficulty, weakness, light-headedness and numbness.  Hematological: Negative.   Psychiatric/Behavioral: Negative.     Last CBC Lab Results  Component Value Date   WBC 8.9 12/19/2018   HGB 13.4 12/19/2018   HCT 40.2 12/19/2018   MCV 90 12/19/2018   MCH 29.8 12/19/2018   RDW 13.9 12/19/2018   PLT 244 12/19/2018   Last metabolic panel Lab Results  Component Value Date   GLUCOSE 164 (H) 12/19/2018   NA 137 12/19/2018   K 5.4 (H) 12/19/2018   CL 102 12/19/2018   CO2 24 12/19/2018   BUN 7 12/19/2018   CREATININE 0.70 12/19/2018   GFRNONAA 109 12/19/2018   GFRAA 125 12/19/2018   CALCIUM 9.4 12/19/2018   PROT 6.9 12/19/2018   ALBUMIN 4.4 12/19/2018   LABGLOB 2.5 12/19/2018   AGRATIO 1.8 12/19/2018   BILITOT 0.2 12/19/2018   ALKPHOS 67 12/19/2018   AST 12 12/19/2018   ALT 21 12/19/2018   ANIONGAP 12 06/16/2017   Last lipids Lab Results  Component Value Date   CHOL 254 (H) 12/19/2018   HDL 27 (L) 12/19/2018   LDLCALC 175 (H) 12/19/2018   TRIG 272 (H) 12/19/2018   CHOLHDL 9.4 (H) 12/19/2018   Last hemoglobin A1c Lab Results  Component Value Date   HGBA1C 8.7 (H) 12/19/2018   Last thyroid functions Lab Results  Component Value Date   TSH 1.630 12/19/2018   Last vitamin D Lab Results  Component Value Date   VD25OH 23.7 (L)  12/19/2018   Last vitamin B12 and Folate No results found for: VITAMINB12, FOLATE  Objective    There were no vitals taken for this visit. BP Readings from Last 3 Encounters:  08/26/19 125/83  01/16/19 120/64  04/09/18 132/76    Physical Exam Vitals reviewed.  Constitutional:      General: She is not in acute distress.    Appearance: Normal appearance. She is well-developed, well-groomed and overweight. She is not ill-appearing or diaphoretic.  HENT:     Head: Normocephalic and atraumatic.     Right Ear: Hearing, tympanic membrane, ear canal and external ear normal.     Left Ear: Hearing, tympanic membrane,  ear canal and external ear normal.     Mouth/Throat:     Pharynx: Uvula midline.  Eyes:     General: No scleral icterus.       Right eye: No discharge.        Left eye: No discharge.     Extraocular Movements: Extraocular movements intact.     Conjunctiva/sclera: Conjunctivae normal.     Pupils: Pupils are equal, round, and reactive to light.  Neck:     Thyroid: No thyromegaly.     Vascular: No JVD.     Trachea: No tracheal deviation.  Cardiovascular:     Rate and Rhythm: Normal rate and regular rhythm.     Pulses: Normal pulses.     Heart sounds: Normal heart sounds. No murmur. No friction rub. No gallop.   Pulmonary:     Effort: Pulmonary effort is normal. No respiratory distress.     Breath sounds: Normal breath sounds. No wheezing or rales.  Chest:     Chest wall: No tenderness.     Breasts: Breasts are symmetrical.        Right: Normal. No inverted nipple, mass, nipple discharge, skin change or tenderness.        Left: Normal. No inverted nipple, mass, nipple discharge, skin change or tenderness.     Comments: Nipples pierced bilaterally Abdominal:     General: Abdomen is flat. Bowel sounds are normal. There is no distension.     Palpations: Abdomen is soft. There is no mass.     Tenderness: There is no abdominal tenderness. There is no guarding or rebound.       Hernia: There is no hernia in the left inguinal area.  Genitourinary:    General: Normal vulva.     Exam position: Supine.     Labia:        Right: No rash, tenderness, lesion or injury.        Left: No rash, tenderness, lesion or injury.      Vagina: Normal. No signs of injury. No vaginal discharge, erythema, tenderness or bleeding.     Cervix: No cervical motion tenderness, discharge or friability.     Adnexa:        Right: No mass, tenderness or fullness.         Left: No mass, tenderness or fullness.       Rectum: Normal.    Musculoskeletal:        General: No tenderness. Normal range of motion.     Cervical back: Normal range of motion and neck supple.     Right lower leg: No edema.     Left lower leg: No edema.  Lymphadenopathy:     Cervical: No cervical adenopathy.     Upper Body:     Right upper body: No supraclavicular, axillary or pectoral adenopathy.     Left upper body: No supraclavicular, axillary or pectoral adenopathy.  Skin:    General: Skin is warm and dry.     Capillary Refill: Capillary refill takes less than 2 seconds.     Findings: No rash.  Neurological:     General: No focal deficit present.     Mental Status: She is alert and oriented to person, place, and time. Mental status is at baseline.     Cranial Nerves: No cranial nerve deficit.     Coordination: Coordination normal.     Deep Tendon Reflexes: Reflexes are normal and symmetric.  Psychiatric:  Mood and Affect: Mood normal.        Behavior: Behavior normal. Behavior is cooperative.        Thought Content: Thought content normal.        Judgment: Judgment normal.      Depression Screen  PHQ 2/9 Scores 07/14/2016  PHQ - 2 Score 0  PHQ- 9 Score 0    No results found for any visits on 08/26/19.  Assessment & Plan    Routine Health Maintenance and Physical Exam  Exercise Activities and Dietary recommendations Goals   None     Immunization History  Administered Date(s)  Administered  . Pneumococcal Polysaccharide-23 01/20/2017  . Tdap 01/03/2008    Health Maintenance  Topic Date Due  . OPHTHALMOLOGY EXAM  Never done  . URINE MICROALBUMIN  Never done  . COVID-19 Vaccine (1) Never done  . TETANUS/TDAP  01/02/2018  . FOOT EXAM  03/08/2019  . HEMOGLOBIN A1C  06/18/2019  . PAP SMEAR-Modifier  08/11/2019  . INFLUENZA VACCINE  11/17/2019  . PNEUMOCOCCAL POLYSACCHARIDE VACCINE AGE 61-64 HIGH RISK  Completed  . HIV Screening  Completed    Discussed health benefits of physical activity, and encouraged her to engage in regular exercise appropriate for her age and condition.  1. Annual physical exam Normal physical exam today. Will check labs as below and f/u pending lab results. If labs are stable and WNL she will not need to have these rechecked for one year at her next annual physical exam. She is to call the office in the meantime if she has any acute issue, questions or concerns.  2. Encounter for breast cancer screening using non-mammogram modality Breast exam today was normal. There is no family history of breast cancer. She does perform regular self breast exams. Mammogram was ordered as below. Information for [facility] clinic was given to patient so she may schedule her mammogram at her convenience. - MM 3D SCREEN BREAST BILATERAL; Future  3. Cervical cancer screening Pap collected today. Will send as below and f/u pending results. - Cytology - PAP  4. Type 2 diabetes mellitus with hyperglycemia, without long-term current use of insulin (HCC) Stable. Continue Metformin 500mg  BID. Will check labs as below and f/u pending results. - CBC w/Diff/Platelet - Comprehensive Metabolic Panel (CMET) - HgB A1c  5. Hypercholesteremia Stable. Continue Atorvastatin 10mg  daily. Will check labs as below and f/u pending results. - CBC w/Diff/Platelet - Comprehensive Metabolic Panel (CMET) - Lipid Panel With LDL/HDL Ratio  6. Irregular bleeding Long  standing history of irregular and heavy menstrual cycles. Reports she passes large clots and has to wear tampons and pads together due to the heaviness of the cycle. Would like referral to GYN for considerations of non-hormonal treatment options such as ablation.  - CBC w/Diff/Platelet - Ambulatory referral to Gynecology  7. Compulsive tobacco user syndrome Declines tobacco cessation at this time.   8. Cutaneous skin tags Noted on clitoris under hood. Not painful. No signs of infection. Will refer to GYN for removal.  - Ambulatory referral to Gynecology  9. Thyroid disorder screen Will check labs as below and f/u pending results. - T4 AND TSH  10. Vitamin D deficiency Will check labs as below and f/u pending results. - CBC w/Diff/Platelet - Vitamin D (25 hydroxy)   No follow-ups on file.     Reynolds Bowl, PA-C, have reviewed all documentation for this visit. The documentation on 08/26/19 for the exam, diagnosis, procedures, and orders are all  accurate and complete.   Rubye Beach  [facility] [phone] (phone) [phone] (fax)  [place]

## 2019-08-26 NOTE — Patient Instructions (Signed)
[facility] [address] [place],  [place]  [address] Get Driving Directions Main: [phone]   Health Maintenance, Female Adopting a healthy lifestyle and getting preventive care are important in promoting health and wellness. Ask your health care provider about:  The right schedule for you to have regular tests and exams.  Things you can do on your own to prevent diseases and keep yourself healthy. What should I know about diet, weight, and exercise? Eat a healthy diet   Eat a diet that includes plenty of vegetables, fruits, low-fat dairy products, and lean protein.  Do not eat a lot of foods that are high in solid fats, added sugars, or sodium. Maintain a healthy weight Body mass index (BMI) is used to identify weight problems. It estimates body fat based on height and weight. Your health care provider can help determine your BMI and help you achieve or maintain a healthy weight. Get regular exercise Get regular exercise. This is one of the most important things you can do for your health. Most adults should:  Exercise for at least 150 minutes each week. The exercise should increase your heart rate and make you sweat (moderate-intensity exercise).  Do strengthening exercises at least twice a week. This is in addition to the moderate-intensity exercise.  Spend less time sitting. Even light physical activity can be beneficial. Watch cholesterol and blood lipids Have your blood tested for lipids and cholesterol at 41 years of age, then have this test every 5 years. Have your cholesterol levels checked more often if:  Your lipid or cholesterol levels are high.  You are older than 40 years of age.  You are at high risk for heart disease. What should I know about cancer screening? Depending on your health history and family history, you may need to have cancer screening at various ages. This may include screening for:  Breast  cancer.  Cervical cancer.  Colorectal cancer.  Skin cancer.  Lung cancer. What should I know about heart disease, diabetes, and high blood pressure? Blood pressure and heart disease  High blood pressure causes heart disease and increases the risk of stroke. This is more likely to develop in people who have high blood pressure readings, are of African descent, or are overweight.  Have your blood pressure checked: ? Every 3-5 years if you are 18-39 years of age. ? Every year if you are 40 years old or older. Diabetes Have regular diabetes screenings. This checks your fasting blood sugar level. Have the screening done:  Once every three years after age 40 if you are at a normal weight and have a low risk for diabetes.  More often and at a younger age if you are overweight or have a high risk for diabetes. What should I know about preventing infection? Hepatitis B If you have a higher risk for hepatitis B, you should be screened for this virus. Talk with your health care provider to find out if you are at risk for hepatitis B infection. Hepatitis C Testing is recommended for:  Everyone born from 1945 through 1965.  Anyone with known risk factors for hepatitis C. Sexually transmitted infections (STIs)  Get screened for STIs, including gonorrhea and chlamydia, if: ? You are sexually active and are younger than 41 years of age. ? You are older than 41 years of age and your health care provider tells you that you are at risk for this type of infection. ? Your sexual activity   has changed since you were last screened, and you are at increased risk for chlamydia or gonorrhea. Ask your health care provider if you are at risk.  Ask your health care provider about whether you are at high risk for HIV. Your health care provider may recommend a prescription medicine to help prevent HIV infection. If you choose to take medicine to prevent HIV, you should first get tested for HIV. You should  then be tested every 3 months for as long as you are taking the medicine. Pregnancy  If you are about to stop having your period (premenopausal) and you may become pregnant, seek counseling before you get pregnant.  Take 400 to 800 micrograms (mcg) of folic acid every day if you become pregnant.  Ask for birth control (contraception) if you want to prevent pregnancy. Osteoporosis and menopause Osteoporosis is a disease in which the bones lose minerals and strength with aging. This can result in bone fractures. If you are 65 years old or older, or if you are at risk for osteoporosis and fractures, ask your health care provider if you should:  Be screened for bone loss.  Take a calcium or vitamin D supplement to lower your risk of fractures.  Be given hormone replacement therapy (HRT) to treat symptoms of menopause. Follow these instructions at home: Lifestyle  Do not use any products that contain nicotine or tobacco, such as cigarettes, e-cigarettes, and chewing tobacco. If you need help quitting, ask your health care provider.  Do not use street drugs.  Do not share needles.  Ask your health care provider for help if you need support or information about quitting drugs. Alcohol use  Do not drink alcohol if: ? Your health care provider tells you not to drink. ? You are pregnant, may be pregnant, or are planning to become pregnant.  If you drink alcohol: ? Limit how much you use to 0-1 drink a day. ? Limit intake if you are breastfeeding.  Be aware of how much alcohol is in your drink. In the [place] one drink equals one 12 oz bottle of beer (355 mL), one 5 oz glass of wine (148 mL), or one 1 oz glass of hard liquor (44 mL). General instructions  Schedule regular health, dental, and eye exams.  Stay current with your vaccines.  Tell your health care provider if: ? You often feel depressed. ? You have ever been abused or do not feel safe at home. Summary  Adopting a  healthy lifestyle and getting preventive care are important in promoting health and wellness.  Follow your health care provider's instructions about healthy diet, exercising, and getting tested or screened for diseases.  Follow your health care provider's instructions on monitoring your cholesterol and blood pressure. This information is not intended to replace advice given to you by your health care provider. Make sure you discuss any questions you have with your health care provider. Document Revised: 03/28/2018 Document Reviewed: 03/28/2018 Elsevier Patient Education  2020 Elsevier Inc.  

## 2019-08-27 ENCOUNTER — Telehealth: Payer: Self-pay

## 2019-08-27 DIAGNOSIS — E782 Mixed hyperlipidemia: Secondary | ICD-10-CM

## 2019-08-27 DIAGNOSIS — R87610 Atypical squamous cells of undetermined significance on cytologic smear of cervix (ASC-US): Secondary | ICD-10-CM | POA: Diagnosis not present

## 2019-08-27 LAB — CBC WITH DIFFERENTIAL/PLATELET
Basophils Absolute: 0 10*3/uL (ref 0.0–0.2)
Basos: 0 %
EOS (ABSOLUTE): 0.1 10*3/uL (ref 0.0–0.4)
Eos: 2 %
Hematocrit: 35.8 % (ref 34.0–46.6)
Hemoglobin: 11.9 g/dL (ref 11.1–15.9)
Immature Grans (Abs): 0 10*3/uL (ref 0.0–0.1)
Immature Granulocytes: 0 %
Lymphocytes Absolute: 2.7 10*3/uL (ref 0.7–3.1)
Lymphs: 39 %
MCH: 29.3 pg (ref 26.6–33.0)
MCHC: 33.2 g/dL (ref 31.5–35.7)
MCV: 88 fL (ref 79–97)
Monocytes Absolute: 0.4 10*3/uL (ref 0.1–0.9)
Monocytes: 5 %
Neutrophils Absolute: 3.7 10*3/uL (ref 1.4–7.0)
Neutrophils: 54 %
Platelets: 243 10*3/uL (ref 150–450)
RBC: 4.06 x10E6/uL (ref 3.77–5.28)
RDW: 13.5 % (ref 11.7–15.4)
WBC: 7 10*3/uL (ref 3.4–10.8)

## 2019-08-27 LAB — COMPREHENSIVE METABOLIC PANEL
ALT: 16 IU/L (ref 0–32)
AST: 12 IU/L (ref 0–40)
Albumin/Globulin Ratio: 1.9 (ref 1.2–2.2)
Albumin: 4.3 g/dL (ref 3.8–4.8)
Alkaline Phosphatase: 70 IU/L (ref 39–117)
BUN/Creatinine Ratio: 9 (ref 9–23)
BUN: 6 mg/dL (ref 6–24)
Bilirubin Total: <0.2 mg/dL (ref 0.0–1.2)
CO2: 23 mmol/L (ref 20–29)
Calcium: 9.1 mg/dL (ref 8.7–10.2)
Chloride: 101 mmol/L (ref 96–106)
Creatinine, Ser: 0.68 mg/dL (ref 0.57–1.00)
GFR calc Af Amer: 126 mL/min/{1.73_m2} (ref >59)
GFR calc non Af Amer: 109 mL/min/{1.73_m2} (ref >59)
Globulin, Total: 2.3 g/dL (ref 1.5–4.5)
Glucose: 176 mg/dL — ABNORMAL HIGH (ref 65–99)
Potassium: 4.1 mmol/L (ref 3.5–5.2)
Sodium: 136 mmol/L (ref 134–144)
Total Protein: 6.6 g/dL (ref 6.0–8.5)

## 2019-08-27 LAB — HEMOGLOBIN A1C
Est. average glucose Bld gHb Est-mCnc: 194 mg/dL
Hgb A1c MFr Bld: 8.4 % — ABNORMAL HIGH (ref 4.8–5.6)

## 2019-08-27 LAB — LIPID PANEL WITH LDL/HDL RATIO
Cholesterol, Total: 223 mg/dL — ABNORMAL HIGH (ref 100–199)
HDL: 27 mg/dL — ABNORMAL LOW (ref >39)
LDL Chol Calc (NIH): 154 mg/dL — ABNORMAL HIGH (ref 0–99)
LDL/HDL Ratio: 5.7 ratio — ABNORMAL HIGH (ref 0.0–3.2)
Triglycerides: 226 mg/dL — ABNORMAL HIGH (ref 0–149)
VLDL Cholesterol Cal: 42 mg/dL — ABNORMAL HIGH (ref 5–40)

## 2019-08-27 LAB — VITAMIN D 25 HYDROXY (VIT D DEFICIENCY, FRACTURES): Vit D, 25-Hydroxy: 23.4 ng/mL — ABNORMAL LOW (ref 30.0–100.0)

## 2019-08-27 LAB — T4 AND TSH
T4, Total: 5.5 ug/dL (ref 4.5–12.0)
TSH: 1.6 u[IU]/mL (ref 0.450–4.500)

## 2019-08-27 NOTE — Telephone Encounter (Signed)
-----   Message from Margaretann Loveless, [place] sent at 08/27/2019  3:06 PM EDT ----- Blood count is normal. Kidney and liver enzymes are normal. Sodium, potassium and calcium are normal. Cholesterol remains elevated but is improved compared to last year. We could increase atorvastatin to 20 mg if you tolerate the 10mg . A1c is down to 8.4 from 8.7. Vit D is low as well. Could benefit from an OTC Vit D supplement of 2000 IU daily.

## 2019-08-27 NOTE — Telephone Encounter (Signed)
Patient advised as directed below. She is going to try Atorvastatin 20mg 

## 2019-08-28 LAB — CYTOLOGY - PAP
Comment: NEGATIVE
Diagnosis: UNDETERMINED — AB
High risk HPV: NEGATIVE

## 2019-08-28 MED ORDER — ATORVASTATIN CALCIUM 20 MG PO TABS: 20.0000 mg | ORAL_TABLET | Freq: Every day | ORAL | 3 refills | Status: DC

## 2019-08-28 NOTE — Telephone Encounter (Signed)
Sent in for her

## 2019-08-29 ENCOUNTER — Telehealth: Payer: Self-pay

## 2019-08-29 NOTE — Telephone Encounter (Signed)
-----   Message from Margaretann Loveless, [place] sent at 08/29/2019 10:25 AM EDT ----- Pap was abnormal with atypical cells of undetermined significance. It is negative for HPV. This is one of the most common atypical findings. With this I normally recommend to repeat pap in one year. Guidelines state you could wait 3 years.

## 2019-08-29 NOTE — Telephone Encounter (Signed)
Patient advised as directed below. 

## 2019-09-10 ENCOUNTER — Encounter: Payer: Self-pay | Admitting: Obstetrics and Gynecology

## 2019-09-10 ENCOUNTER — Ambulatory Visit (INDEPENDENT_AMBULATORY_CARE_PROVIDER_SITE_OTHER): Payer: BC Managed Care – PPO | Admitting: Obstetrics and Gynecology

## 2019-09-10 ENCOUNTER — Other Ambulatory Visit: Payer: Self-pay

## 2019-09-10 ENCOUNTER — Other Ambulatory Visit (HOSPITAL_COMMUNITY)
Admission: RE | Admit: 2019-09-10 | Discharge: 2019-09-10 | Disposition: A | Discharge status: Home / Self Care | Payer: BC Managed Care – PPO | Source: Ambulatory Visit | Attending: Obstetrics and Gynecology | Admitting: Obstetrics and Gynecology

## 2019-09-10 VITALS — Ht 62.5 in | Wt 163.0 lb

## 2019-09-10 DIAGNOSIS — N763 Subacute and chronic vulvitis: Secondary | ICD-10-CM | POA: Diagnosis not present

## 2019-09-10 DIAGNOSIS — N920 Excessive and frequent menstruation with regular cycle: Secondary | ICD-10-CM

## 2019-09-10 DIAGNOSIS — B9689 Other specified bacterial agents as the cause of diseases classified elsewhere: Secondary | ICD-10-CM

## 2019-09-10 DIAGNOSIS — N760 Acute vaginitis: Secondary | ICD-10-CM

## 2019-09-10 DIAGNOSIS — Z23 Encounter for immunization: Secondary | ICD-10-CM

## 2019-09-10 DIAGNOSIS — A630 Anogenital (venereal) warts: Secondary | ICD-10-CM | POA: Insufficient documentation

## 2019-09-10 DIAGNOSIS — N939 Abnormal uterine and vaginal bleeding, unspecified: Secondary | ICD-10-CM | POA: Diagnosis not present

## 2019-09-10 DIAGNOSIS — N904 Leukoplakia of vulva: Secondary | ICD-10-CM | POA: Diagnosis not present

## 2019-09-10 MED ORDER — CLINDAMYCIN HCL 300 MG PO CAPS: 300.0000 mg | ORAL_CAPSULE | Freq: Two times a day (BID) | ORAL | 0 refills | Status: AC

## 2019-09-10 NOTE — Patient Instructions (Addendum)
Endometrial Ablation Endometrial ablation is a procedure that destroys the thin inner layer of the lining of the uterus (endometrium). This procedure may be done:  To stop heavy periods.  To stop bleeding that is causing anemia.  To control irregular bleeding.  To treat bleeding caused by small tumors (fibroids) in the endometrium. This procedure is often an alternative to major surgery, such as removal of the uterus and cervix (hysterectomy). As a result of this procedure:  You may not be able to have children. However, if you are premenopausal (you have not gone through menopause): ? You may still have a small chance of getting pregnant. ? You will need to use a reliable method of birth control after the procedure to prevent pregnancy.  You may stop having a menstrual period, or you may have only a small amount of bleeding during your period. Menstruation may return several years after the procedure. Tell a health care provider about:  Any allergies you have.  All medicines you are taking, including vitamins, herbs, eye drops, creams, and over-the-counter medicines.  Any problems you or family members have had with the use of anesthetic medicines.  Any blood disorders you have.  Any surgeries you have had.  Any medical conditions you have. What are the risks? Generally, this is a safe procedure. However, problems may occur, including:  A hole (perforation) in the uterus or bowel.  Infection of the uterus, bladder, or vagina.  Bleeding.  Damage to other structures or organs.  An air bubble in the lung (air embolus).  Problems with pregnancy after the procedure.  Failure of the procedure.  Decreased ability to diagnose cancer in the endometrium. What happens before the procedure?  You will have tests of your endometrium to make sure there are no pre-cancerous cells or cancer cells present.  You may have an ultrasound of the uterus.  You may be given medicines to  thin the endometrium.  Ask your health care provider about: ? Changing or stopping your regular medicines. This is especially important if you take diabetes medicines or blood thinners. ? Taking medicines such as aspirin and ibuprofen. These medicines can thin your blood. Do not take these medicines before your procedure if your doctor tells you not to.  Plan to have someone take you home from the hospital or clinic. What happens during the procedure?   You will lie on an exam table with your feet and legs supported as in a pelvic exam.  To lower your risk of infection: ? Your health care team will wash or sanitize their hands and put on germ-free (sterile) gloves. ? Your genital area will be washed with soap.  An IV tube will be inserted into one of your veins.  You will be given a medicine to help you relax (sedative).  A surgical instrument with a light and camera (resectoscope) will be inserted into your vagina and moved into your uterus. This allows your surgeon to see inside your uterus.  Endometrial tissue will be removed using one of the following methods: ? Radiofrequency. This method uses a radiofrequency-alternating electric current to remove the endometrium. ? Cryotherapy. This method uses extreme cold to freeze the endometrium. ? Heated-free liquid. This method uses a heated saltwater (saline) solution to remove the endometrium. ? Microwave. This method uses high-energy microwaves to heat up the endometrium and remove it. ? Thermal balloon. This method involves inserting a catheter with a balloon tip into the uterus. The balloon tip is filled with   heated fluid to remove the endometrium. The procedure may vary among health care providers and hospitals. What happens after the procedure?  Your blood pressure, heart rate, breathing rate, and blood oxygen level will be monitored until the medicines you were given have worn off.  As tissue healing occurs, you may notice  vaginal bleeding for 4-6 weeks after the procedure. You may also experience: ? Cramps. ? Thin, watery vaginal discharge that is light pink or brown in color. ? A need to urinate more frequently than usual. ? Nausea.  Do not drive for 24 hours if you were given a sedative.  Do not have sex or insert anything into your vagina until your health care provider approves. Summary  Endometrial ablation is done to treat the many causes of heavy menstrual bleeding.  The procedure may be done only after medications have been tried to control the bleeding.  Plan to have someone take you home from the hospital or clinic. This information is not intended to replace advice given to you by your health care provider. Make sure you discuss any questions you have with your health care provider. Document Revised: 09/19/2017 Document Reviewed: 04/21/2016 Elsevier Patient Education  2020 [employer]. https://www.cdc.gov/vaccines/hcp/vis/vis-statements/tdap.pdf">  Tdap (Tetanus, Diphtheria, Pertussis) Vaccine: What You Need to Know 1. Why get vaccinated? Tdap vaccine can prevent tetanus, diphtheria, and pertussis. Diphtheria and pertussis spread from person to person. Tetanus enters the body through cuts or wounds.  TETANUS (T) causes painful stiffening of the muscles. Tetanus can lead to serious health problems, including being unable to open the mouth, having trouble swallowing and breathing, or death.  DIPHTHERIA (D) can lead to difficulty breathing, heart failure, paralysis, or death.  PERTUSSIS (aP), also known as "whooping cough," can cause uncontrollable, violent coughing which makes it hard to breathe, eat, or drink. Pertussis can be extremely serious in babies and young children, causing pneumonia, convulsions, brain damage, or death. In teens and adults, it can cause weight loss, loss of bladder control, passing out, and rib fractures from severe coughing. 2. Tdap vaccine Tdap is only for  children 7 years and older, adolescents, and adults.  Adolescents should receive a single dose of Tdap, preferably at age 65 or 12 years. Pregnant women should get a dose of Tdap during every pregnancy, to protect the newborn from pertussis. Infants are most at risk for severe, life-threatening complications from pertussis. Adults who have never received Tdap should get a dose of Tdap. Also, adults should receive a booster dose every 10 years, or earlier in the case of a severe and dirty wound or burn. Booster doses can be either Tdap or Td (a different vaccine that protects against tetanus and diphtheria but not pertussis). Tdap may be given at the same time as other vaccines. 3. Talk with your health care provider Tell your vaccine provider if the person getting the vaccine:  Has had an allergic reaction after a previous dose of any vaccine that protects against tetanus, diphtheria, or pertussis, or has any severe, life-threatening allergies.  Has had a coma, decreased level of consciousness, or prolonged seizures within 7 days after a previous dose of any pertussis vaccine (DTP, DTaP, or Tdap).  Has seizures or another nervous system problem.  Has ever had Guillain-Barr Syndrome (also called GBS).  Has had severe pain or swelling after a previous dose of any vaccine that protects against tetanus or diphtheria. In some cases, your health care provider may decide to postpone Tdap vaccination to a future  visit.  People with minor illnesses, such as a cold, may be vaccinated. People who are moderately or severely ill should usually wait until they recover before getting Tdap vaccine.  Your health care provider can give you more information. 4. Risks of a vaccine reaction  Pain, redness, or swelling where the shot was given, mild fever, headache, feeling tired, and nausea, vomiting, diarrhea, or stomachache sometimes happen after Tdap vaccine. People sometimes faint after medical procedures,  including vaccination. Tell your provider if you feel dizzy or have vision changes or ringing in the ears.  As with any medicine, there is a very remote chance of a vaccine causing a severe allergic reaction, other serious injury, or death. 5. What if there is a serious problem? An allergic reaction could occur after the vaccinated person leaves the clinic. If you see signs of a severe allergic reaction (hives, swelling of the face and throat, difficulty breathing, a fast heartbeat, dizziness, or weakness), call 9-1-1 and get the person to the nearest hospital. For other signs that concern you, call your health care provider.  Adverse reactions should be reported to the Vaccine Adverse Event Reporting System (VAERS). Your health care provider will usually file this report, or you can do it yourself. Visit the VAERS website at www.vaers.LAgents.no or call 616-174-0941. VAERS is only for reporting reactions, and VAERS staff do not give medical advice. 6. The National Vaccine Injury Compensation Program The [employer] Vaccine Injury Compensation Program (VICP) is a federal program that was created to compensate people who may have been injured by certain vaccines. Visit the VICP website at SpiritualWord.at or call 571-391-3368 to learn about the program and about filing a claim. There is a time limit to file a claim for compensation. 7. How can I learn more?  Ask your health care provider.  Call your local or state health department.  Contact the Centers for Disease Control and Prevention (CDC): ? Call 4093576016 (1-800-CDC-INFO) or ? Visit CDC's website at PicCapture.uy Vaccine Information Statement Tdap (Tetanus, Diphtheria, Pertussis) Vaccine (07/18/2018) This information is not intended to replace advice given to you by your health care provider. Make sure you discuss any questions you have with your health care provider. Document Revised: 07/27/2018 Document Reviewed:  07/30/2018 Elsevier Patient Education  2020 [employer].    Hysterectomy Information  A hysterectomy is a surgery in which the uterus is removed. The fallopian tubes and ovaries may be removed (bilateral salpingo-oophorectomy) as well. This procedure may be done to treat various medical problems. After the procedure, a woman will no longer have menstrual periods nor will she be able to become pregnant (sterile). What are the reasons for a hysterectomy? There are many reasons why a woman might have this procedure. They include:  Persistent, abnormal vaginal bleeding.  Long-term (chronic) pelvic pain or infection.  Endometriosis. This is when the lining of the uterus (endometrium) starts to grow outside the uterus.  Adenomyosis. This is when the endometrium starts to grow in the muscle of the uterus.  Pelvic organ prolapse. This is a condition in which the uterus falls down into the vagina.  Noncancerous growths in the uterus (uterine fibroids) that cause symptoms.  The presence of precancerous cells.  Cervical or uterine cancer. What are the different types of hysterectomy? There are three different types of hysterectomy:  Supracervical hysterectomy. In this type, the top part of the uterus is removed, but not the cervix.  Total hysterectomy. In this type, the uterus and cervix are removed.  Radical hysterectomy. In this type, the uterus, the cervix, and the tissue that holds the uterus in place (parametrium) are removed. What are the different ways a hysterectomy can be performed? There are many different ways a hysterectomy can be performed, including:  Abdominal hysterectomy. In this type, an incision is made in the abdomen. The uterus is removed through this incision.  Vaginal hysterectomy. In this type, an incision is made in the vagina. The uterus is removed through this incision. There are no abdominal incisions.  Conventional laparoscopic hysterectomy. In this type,  three or four small incisions are made in the abdomen. A thin, lighted tube with a camera (laparoscope) is inserted into one of the incisions. Other tools are put through the other incisions. The uterus is cut into small pieces. The small pieces are removed through the incisions or through the vagina.  Laparoscopically assisted vaginal hysterectomy (LAVH). In this type, three or four small incisions are made in the abdomen. Part of the surgery is performed laparoscopically and the other part is done vaginally. The uterus is removed through the vagina.  Robot-assisted laparoscopic hysterectomy. In this type, a laparoscope and other tools are inserted into three or four small incisions in the abdomen. A computer-controlled device is used to give the surgeon a 3D image and to help control the surgical instruments. This allows for more precise movements of surgical instruments. The uterus is cut into small pieces and removed through the incisions or removed through the vagina. Discuss the options with your health care provider to determine which type is the right one for you. What are the risks? Generally, this is a safe procedure. However, problems may occur, including:  Bleeding and risk of blood transfusion. Tell your health care provider if you do not want to receive any blood products.  Blood clots in the legs or lung.  Infection.  Damage to other structures or organs.  Allergic reactions to medicines.  Changing to an abdominal hysterectomy from one of the other techniques. What to expect after a hysterectomy  You will be given pain medicine.  You may need to stay in the hospital for 1- 2 days to recover, depending on the type of hysterectomy you had.  Follow your health care provider's instructions about exercise, driving, and general activities. Ask your health care provider what activities are safe for you.  You will need to have someone with you for the first 3-5 days after you go  home.  You will need to follow up with your surgeon in 2-4 weeks after surgery to evaluate your progress.  If the ovaries are removed, you will have early menopause symptoms such as hot flashes, night sweats, and insomnia.  If you had a hysterectomy for a problem that was not cancer or not a condition that could lead to cancer, then you no longer need Pap tests. However, even if you no longer need a Pap test, a regular pelvic exam is a good idea to make sure no other problems are developing. Questions to ask your health care provider  Is a hysterectomy medically necessary? Do I have other treatment options for my condition?  What are my options for hysterectomy procedure?  What organs and tissues need to be removed?  What are the risks?  What are the benefits?  How long will I need to stay in the hospital after the procedure?  How long will I need to recover at home?  What symptoms can I expect after the procedure? Summary  A hysterectomy is a surgery in which the uterus is removed. The fallopian tubes and ovaries may be removed (bilateral salpingo-oophorectomy) as well.  This procedure may be done to treat various medical problems. After the procedure, a woman will no longer have menstrual periods nor will she be able to become pregnant.  Discuss the options with your health care provider to determine which type of hysterectomy is the right one for you. This information is not intended to replace advice given to you by your health care provider. Make sure you discuss any questions you have with your health care provider. Document Revised: 03/17/2017 Document Reviewed: 05/11/2016 Elsevier Patient Education  2020 [employer].

## 2019-09-10 NOTE — Progress Notes (Signed)
Patient ID: Bethany Edwards, female   DOB: June 12, 1980, 39 y.o.   MRN: [id number]  Reason for Consult: Menstrual Problem and Skin Tag   Referred by Margaretann Loveless, P*  Subjective:     HPI:  Bethany Edwards is a 39 y.o. female.  She presents today for consultation regarding heavy menstrual bleeding and a clitoral skin tag which her partner recently noticed.  She reports that she has a history of heavy vaginal bleeding on her menstrual cycle for the last 2 years.  She reports that she has been having a lot of pain for the last 6 to 7 months.  She reports that she takes Tylenol and Motrin during her menstrual cycle for some relief.  She reports that she has been passing large clots.  She reports that there are multiple palm-sized clots during her menstrual cycle.  She has sensations of gushing and flooding during her menstrual cycle.  She has to change her tampon or menstrual pad at least once an hour.  She reports that she occasionally misses work because of her menstrual cycle although she has a high pain tolerance and tries to attend most days.  She reports that her periods are monthly occurring every 4 weeks.  She reports that her menstrual cycles last for 7 days.  She has no bleeding or spotting in between her menstrual cycles.  She has no pain or bleeding with intercourse.  She has no history of fibroids or polyps.  She has a remote history of an ovarian cyst which ruptured and she did not require surgery for it.  She has no history of endometriosis or PCOS that she knows of.  She reports that in 2003 she had a tubal ligation.  She is sexually active.  She does not have a history of any abnormal Pap smears.  She does not have any history of sexually transmitted infections she has no family history of breast uterine or ovarian cancer that she knows of, however she is adopted.  Gynecological History LMP: 08/15/2019 Describes periods as regular monthly with heavy bleeding Last pap smear: 08/26/2019-  ASCUS, HPV- Last Mammogram: ordered History of STDs: Denies Sexually Active: Yes uses tubal ligation as contraception.  No pain or bleeding with intercourse.  Obstetrical History  OB History  Gravida Para Term Preterm AB Living  3 2 2   1 2   SAB TAB Ectopic Multiple Live Births  1            # Outcome Date GA Lbr Len/2nd Weight Sex Delivery Anes PTL Lv  3 Term 07/19/01   5 lb 14 oz (2.665 kg) F Vag-Spont     2 Term 03/13/00   5 lb 13 oz (2.637 kg) M Vag-Spont     1 SAB              Past Medical History:  Diagnosis Date  . Depression   . Diabetes mellitus without complication (HCC)    Type II  . Hyperlipidemia    Family History  Adopted: Yes  Family history unknown: Yes   Past Surgical History:  Procedure Laterality Date  . INCISION AND DRAINAGE ABSCESS Left 2003 and 2007   Axillary Abscess  . TUBAL LIGATION  2003    Short Social History:  Social History   Tobacco Use  . Smoking status: Current Every Day Smoker    Packs/day: 0.50  . Smokeless tobacco: Never Used  Substance Use Topics  . Alcohol use: No  Allergies  Allergen Reactions  . Penicillins Rash    Has patient had a PCN reaction causing immediate rash, facial/tongue/throat swelling, SOB or lightheadedness with hypotension: Unknown Has patient had a PCN reaction causing severe rash involving mucus membranes or skin necrosis: Unknown Has patient had a PCN reaction that required hospitalization: Unknown Has patient had a PCN reaction occurring within the last 10 years: No If all of the above answers are "NO", then may proceed with Cephalosporin use.     Current Outpatient Medications  Medication Sig Dispense Refill  . acetaminophen (TYLENOL) 500 MG tablet Take 500 mg by mouth as needed.    Marland Kitchen atorvastatin (LIPITOR) 20 MG tablet Take 1 tablet (20 mg total) by mouth daily. 90 tablet 3  . ibuprofen (ADVIL) 200 MG tablet Take 200 mg by mouth as needed.    . metFORMIN (GLUCOPHAGE) 500 MG tablet TAKE 1  TABLET(500 MG) BY MOUTH TWICE DAILY WITH A MEAL 180 tablet 1  . OZEMPIC, 0.25 OR 0.5 MG/DOSE, 2 MG/1.5ML SOPN INJECT 0.5 MG UNDER THE SKIN ONCE A WEEK 1.5 mL 4   No current facility-administered medications for this visit.    Review of Systems  Constitutional: Negative for chills, fatigue, fever and unexpected weight change.  HENT: Negative for trouble swallowing.  Eyes: Negative for loss of vision.  Respiratory: Negative for cough, shortness of breath and wheezing.  Cardiovascular: Negative for chest pain, leg swelling, palpitations and syncope.  GI: Negative for abdominal pain, blood in stool, diarrhea, nausea and vomiting.  GU: Negative for difficulty urinating, dysuria, frequency and hematuria.  Musculoskeletal: Negative for back pain, leg pain and joint pain.  Skin: Negative for rash.  Neurological: Negative for dizziness, headaches, light-headedness, numbness and seizures.  Psychiatric: Negative for behavioral problem, confusion, depressed mood and sleep disturbance.        Objective:  Objective   Vitals:   09/10/19 0804  Weight: 163 lb (73.9 kg)  Height: 5' 2.5" (1.588 m)   Body mass index is 29.34 kg/m.  Physical Exam Vitals and nursing note reviewed.  Constitutional:      Appearance: She is well-developed.  HENT:     Head: Normocephalic and atraumatic.  Eyes:     Pupils: Pupils are equal, round, and reactive to light.  Cardiovascular:     Rate and Rhythm: Normal rate and regular rhythm.  Pulmonary:     Effort: Pulmonary effort is normal. No respiratory distress.  Skin:    General: Skin is warm and dry.  Neurological:     Mental Status: She is alert and oriented to person, place, and time.  Psychiatric:        Behavior: Behavior normal.        Thought Content: Thought content normal.        Judgment: Judgment normal.    Procedure Clitoral hood was cleansed with Betadine.  1 cc of lidocaine was injected locally.  The lesion was removed using surgical  scissors.  Good hemostasis was noted at the after the procedure.  Specimen was sent to pathology for further evaluation.      Assessment/Plan:     39 year old 1.  Menorrhagia-recent CBC and thyroid testing was normal.  We will have patient follow-up for pelvic ultrasound.  Discussed options for medical and surgical management.  Patient is not inclined towards medical management. She does not desire a Nexplanon or IUD. She may desire an ablation or potentially a hysterectomy.  Patient was counseled about uterine ablation. She was told that  it is normal to have menstrual bleeding after an endometrial ablation, only about 80% of patients become amenorrheic, 10% of patients have normal or light periods, and 10% of patients have no change in their bleeding pattern and may need further intervention.  She was told she will observe her periods for a few months after her ablation to see what her periods will be like; it is recommended to wait until at least three months after the procedure before making conclusions about how periods are going to be like after an ablation.  2.  Clitoral lesion removed.  Appearance consistent with genital wart.  Will wait on final pathology.  More than 45 minutes were spent face to face with the patient in the room, reviewing the medical record, labs and images, and coordinating care for the patient. The plan of management was discussed in detail and counseling was provided.   Adrian Prows MD [facility], [facility] 09/10/2019 8:09 AM

## 2019-09-18 LAB — SURGICAL PATHOLOGY

## 2019-09-19 ENCOUNTER — Ambulatory Visit
Admission: RE | Admit: 2019-09-19 | Discharge: 2019-09-19 | Disposition: A | Discharge status: Home / Self Care | Payer: BC Managed Care – PPO | Source: Ambulatory Visit | Attending: Physician Assistant | Admitting: Physician Assistant

## 2019-09-19 DIAGNOSIS — Z1239 Encounter for other screening for malignant neoplasm of breast: Secondary | ICD-10-CM

## 2019-09-19 DIAGNOSIS — Z1231 Encounter for screening mammogram for malignant neoplasm of breast: Secondary | ICD-10-CM | POA: Diagnosis not present

## 2019-09-20 ENCOUNTER — Telehealth: Payer: Self-pay

## 2019-09-20 NOTE — Telephone Encounter (Signed)
-----   Message from Margaretann Loveless, [place] sent at 09/20/2019  1:13 PM EDT ----- Normal mammogram. Repeat screening in one year.

## 2019-09-20 NOTE — Telephone Encounter (Signed)
[employer]   Result Notes and Comments to Patient Comment seen by patient Bethany Edwards on 09/20/2019 1:13 PM EDT

## 2019-09-24 ENCOUNTER — Ambulatory Visit (INDEPENDENT_AMBULATORY_CARE_PROVIDER_SITE_OTHER): Payer: BC Managed Care – PPO

## 2019-09-24 ENCOUNTER — Ambulatory Visit (INDEPENDENT_AMBULATORY_CARE_PROVIDER_SITE_OTHER): Payer: BC Managed Care – PPO | Admitting: Obstetrics and Gynecology

## 2019-09-24 ENCOUNTER — Other Ambulatory Visit: Payer: Self-pay

## 2019-09-24 ENCOUNTER — Encounter: Payer: Self-pay | Admitting: Obstetrics and Gynecology

## 2019-09-24 VITALS — BP 110/74 | Ht 63.0 in | Wt 164.2 lb

## 2019-09-24 DIAGNOSIS — N920 Excessive and frequent menstruation with regular cycle: Secondary | ICD-10-CM

## 2019-09-24 DIAGNOSIS — N939 Abnormal uterine and vaginal bleeding, unspecified: Secondary | ICD-10-CM

## 2019-09-24 NOTE — H&P (View-Only) (Signed)
Patient ID: Bethany Edwards, female   DOB: 02-12-1981, 38 y.o.   MRN: [id number]  Reason for Consult: Gynecologic Exam   Referred by Margaretann Loveless, P*  Subjective:     HPI:  Bethany Edwards is a 39 y.o. female.  She is following up today for her menorrhagia.  She had a pelvic ultrasound which was normal.  She has been considering her options for an ablation or hysterectomy.  She reports that before she thought she would like a hysterectomy but she is now inclined to have an ablation given that it would involve her needing less time off of work.  She is already had a tubal ligation and does not desire further children.  She has been healing well since her surgery to remove a lesion from her clitoris which had benign pathology.   Past Medical History:  Diagnosis Date  . Depression   . Diabetes mellitus without complication (HCC)    Type II  . Hyperlipidemia    Family History  Adopted: Yes  Family history unknown: Yes   Past Surgical History:  Procedure Laterality Date  . INCISION AND DRAINAGE ABSCESS Left 2003 and 2007   Axillary Abscess  . TUBAL LIGATION  2003    Short Social History:  Social History   Tobacco Use  . Smoking status: Current Every Day Smoker    Packs/day: 0.50  . Smokeless tobacco: Never Used  Substance Use Topics  . Alcohol use: No    Allergies  Allergen Reactions  . Penicillins Rash    Has patient had a PCN reaction causing immediate rash, facial/tongue/throat swelling, SOB or lightheadedness with hypotension: Unknown Has patient had a PCN reaction causing severe rash involving mucus membranes or skin necrosis: Unknown Has patient had a PCN reaction that required hospitalization: Unknown Has patient had a PCN reaction occurring within the last 10 years: No If all of the above answers are "NO", then may proceed with Cephalosporin use.     Current Outpatient Medications  Medication Sig Dispense Refill  . acetaminophen (TYLENOL) 500 MG tablet  Take 500 mg by mouth as needed.    Marland Kitchen atorvastatin (LIPITOR) 20 MG tablet Take 1 tablet (20 mg total) by mouth daily. 90 tablet 3  . ibuprofen (ADVIL) 200 MG tablet Take 200 mg by mouth as needed.    . metFORMIN (GLUCOPHAGE) 500 MG tablet TAKE 1 TABLET(500 MG) BY MOUTH TWICE DAILY WITH A MEAL 180 tablet 1  . OZEMPIC, 0.25 OR 0.5 MG/DOSE, 2 MG/1.5ML SOPN INJECT 0.5 MG UNDER THE SKIN ONCE A WEEK 1.5 mL 4   No current facility-administered medications for this visit.    Review of Systems  Constitutional: Negative for chills, fatigue, fever and unexpected weight change.  HENT: Negative for trouble swallowing.  Eyes: Negative for loss of vision.  Respiratory: Negative for cough, shortness of breath and wheezing.  Cardiovascular: Negative for chest pain, leg swelling, palpitations and syncope.  GI: Negative for abdominal pain, blood in stool, diarrhea, nausea and vomiting.  GU: Negative for difficulty urinating, dysuria, frequency and hematuria.  Musculoskeletal: Negative for back pain, leg pain and joint pain.  Skin: Negative for rash.  Neurological: Negative for dizziness, headaches, light-headedness, numbness and seizures.  Psychiatric: Negative for behavioral problem, confusion, depressed mood and sleep disturbance.        Objective:  Objective   Vitals:   09/24/19 1630  BP: 110/74  Weight: 164 lb 3.2 oz (74.5 kg)  Height: 5\' 3"  (1.6 m)  Body mass index is 29.09 kg/m.  Physical Exam Vitals and nursing note reviewed.  Constitutional:      Appearance: She is well-developed.  HENT:     Head: Normocephalic and atraumatic.  Eyes:     Pupils: Pupils are equal, round, and reactive to light.  Cardiovascular:     Rate and Rhythm: Normal rate and regular rhythm.  Pulmonary:     Effort: Pulmonary effort is normal. No respiratory distress.  Skin:    General: Skin is warm and dry.  Neurological:     Mental Status: She is alert and oriented to person, place, and time.    Psychiatric:        Behavior: Behavior normal.        Thought Content: Thought content normal.        Judgment: Judgment normal.         Assessment/Plan:     39-year-old with menorrhagia with regular cycle. Have discussed options for medical versus surgical therapy with the patient in detail for her menorrhagia.  Patient is most interested in a endometrial ablation.  We will schedule the patient for hysteroscopy D&C and endometrial ablation with [facility] for sometime in the next coming months as convenient for the patient schedule.  Discussed that with the endometrial ablation there is the possibility of cyclical pelvic pain as well as incomplete resolution of menorrhagia.   Patient was told that it is normal to have menstrual bleeding after an endometrial ablation, only about 80% of patients become amenorrheic, 10% of patients have normal or light periods, and 10% of patients have no change in their bleeding pattern and may need further intervention.  She was told she will observe her periods for a few months after her ablation to see what her periods will be like; it is recommended to wait until at least three  months after the procedure before making conclusions about how periods are going to be like after an ablation.  More than 25 minutes were spent face to face with the patient in the room, reviewing the medical record, labs and images, and coordinating care for the patient. The plan of management was discussed in detail and counseling was provided.     Dason Mosley MD [facility], [facility] 09/24/2019 5:26 PM    

## 2019-09-24 NOTE — Progress Notes (Signed)
Patient ID: Bethany Edwards, female   DOB: 02-12-1981, 38 y.o.   MRN: [id number]  Reason for Consult: Gynecologic Exam   Referred by Margaretann Loveless, P*  Subjective:     HPI:  Bethany Edwards is a 39 y.o. female.  She is following up today for her menorrhagia.  She had a pelvic ultrasound which was normal.  She has been considering her options for an ablation or hysterectomy.  She reports that before she thought she would like a hysterectomy but she is now inclined to have an ablation given that it would involve her needing less time off of work.  She is already had a tubal ligation and does not desire further children.  She has been healing well since her surgery to remove a lesion from her clitoris which had benign pathology.   Past Medical History:  Diagnosis Date  . Depression   . Diabetes mellitus without complication (HCC)    Type II  . Hyperlipidemia    Family History  Adopted: Yes  Family history unknown: Yes   Past Surgical History:  Procedure Laterality Date  . INCISION AND DRAINAGE ABSCESS Left 2003 and 2007   Axillary Abscess  . TUBAL LIGATION  2003    Short Social History:  Social History   Tobacco Use  . Smoking status: Current Every Day Smoker    Packs/day: 0.50  . Smokeless tobacco: Never Used  Substance Use Topics  . Alcohol use: No    Allergies  Allergen Reactions  . Penicillins Rash    Has patient had a PCN reaction causing immediate rash, facial/tongue/throat swelling, SOB or lightheadedness with hypotension: Unknown Has patient had a PCN reaction causing severe rash involving mucus membranes or skin necrosis: Unknown Has patient had a PCN reaction that required hospitalization: Unknown Has patient had a PCN reaction occurring within the last 10 years: No If all of the above answers are "NO", then may proceed with Cephalosporin use.     Current Outpatient Medications  Medication Sig Dispense Refill  . acetaminophen (TYLENOL) 500 MG tablet  Take 500 mg by mouth as needed.    Marland Kitchen atorvastatin (LIPITOR) 20 MG tablet Take 1 tablet (20 mg total) by mouth daily. 90 tablet 3  . ibuprofen (ADVIL) 200 MG tablet Take 200 mg by mouth as needed.    . metFORMIN (GLUCOPHAGE) 500 MG tablet TAKE 1 TABLET(500 MG) BY MOUTH TWICE DAILY WITH A MEAL 180 tablet 1  . OZEMPIC, 0.25 OR 0.5 MG/DOSE, 2 MG/1.5ML SOPN INJECT 0.5 MG UNDER THE SKIN ONCE A WEEK 1.5 mL 4   No current facility-administered medications for this visit.    Review of Systems  Constitutional: Negative for chills, fatigue, fever and unexpected weight change.  HENT: Negative for trouble swallowing.  Eyes: Negative for loss of vision.  Respiratory: Negative for cough, shortness of breath and wheezing.  Cardiovascular: Negative for chest pain, leg swelling, palpitations and syncope.  GI: Negative for abdominal pain, blood in stool, diarrhea, nausea and vomiting.  GU: Negative for difficulty urinating, dysuria, frequency and hematuria.  Musculoskeletal: Negative for back pain, leg pain and joint pain.  Skin: Negative for rash.  Neurological: Negative for dizziness, headaches, light-headedness, numbness and seizures.  Psychiatric: Negative for behavioral problem, confusion, depressed mood and sleep disturbance.        Objective:  Objective   Vitals:   09/24/19 1630  BP: 110/74  Weight: 164 lb 3.2 oz (74.5 kg)  Height: 5\' 3"  (1.6 m)  Body mass index is 29.09 kg/m.  Physical Exam Vitals and nursing note reviewed.  Constitutional:      Appearance: She is well-developed.  HENT:     Head: Normocephalic and atraumatic.  Eyes:     Pupils: Pupils are equal, round, and reactive to light.  Cardiovascular:     Rate and Rhythm: Normal rate and regular rhythm.  Pulmonary:     Effort: Pulmonary effort is normal. No respiratory distress.  Skin:    General: Skin is warm and dry.  Neurological:     Mental Status: She is alert and oriented to person, place, and time.    Psychiatric:        Behavior: Behavior normal.        Thought Content: Thought content normal.        Judgment: Judgment normal.         Assessment/Plan:     39 year old with menorrhagia with regular cycle. Have discussed options for medical versus surgical therapy with the patient in detail for her menorrhagia.  Patient is most interested in a endometrial ablation.  We will schedule the patient for hysteroscopy D&C and endometrial ablation with [facility] for sometime in the next coming months as convenient for the patient schedule.  Discussed that with the endometrial ablation there is the possibility of cyclical pelvic pain as well as incomplete resolution of menorrhagia.   Patient was told that it is normal to have menstrual bleeding after an endometrial ablation, only about 80% of patients become amenorrheic, 10% of patients have normal or light periods, and 10% of patients have no change in their bleeding pattern and may need further intervention.  She was told she will observe her periods for a few months after her ablation to see what her periods will be like; it is recommended to wait until at least three  months after the procedure before making conclusions about how periods are going to be like after an ablation.  More than 25 minutes were spent face to face with the patient in the room, reviewing the medical record, labs and images, and coordinating care for the patient. The plan of management was discussed in detail and counseling was provided.     Adrian Prows MD [facility], [facility] 09/24/2019 5:26 PM

## 2019-09-27 ENCOUNTER — Telehealth: Payer: Self-pay | Admitting: Obstetrics and Gynecology

## 2019-09-27 NOTE — Telephone Encounter (Signed)
Pt called to schedule Hysteroscopy D&C w Ablation with [facility]   DOS 7/6  H&P not needed per CRS   Covid testing 7/2 @ 8-10:30, [address], drive up and wear mask. Advised pt to quarantine until DOS. Pt asked if this was necessary as she has had both of her Covid vaccines. I adv that it is still hospital policy.  Pre-admit phone call appointment to be requested - all appointments will be updated on pt MyChart. Explained that this appointment has a call window. Based on the time scheduled will indicate if the call will be received within a 4 hour window before 1:00 or after.  Advised that pt may also receive calls from the hospital pharmacy and pre-service center.  Confirmed pt has [employer] as Editor, commissioning. And [employer] as secondary insurance.

## 2019-09-27 NOTE — Telephone Encounter (Signed)
-----   Message from Natale Milch, MD sent at 09/24/2019  5:20 PM EDT ----- Surgery Booking Request Patient Full Name:  Bethany Edwards  MRN: [id number]  DOB: [birth year]  Surgeon: Natale Milch, MD  Requested Surgery Date and Time: July 2021 Primary Diagnosis AND Code: Menorrhagia- N92.0 Secondary Diagnosis and Code:  Surgical Procedure: Hysteroscopy D&C with Ablation L&D Notification: No Admission Status: same day surgery Length of Surgery: 1 hour Special Case Needs: No H&P: No Phone Interview???:  Yes Interpreter: No Language:  Medical Clearance:  No Special Scheduling Instructions: Does not need a preop visit Any known health/anesthesia issues, diabetes, sleep apnea, latex allergy, defibrillator/pacemaker?: No Acuity: P3   (P1 highest, P2 delay may cause harm, P3 low, elective gyn, P4 lowest)

## 2019-10-14 ENCOUNTER — Other Ambulatory Visit: Payer: Self-pay

## 2019-10-14 ENCOUNTER — Other Ambulatory Visit: Payer: Self-pay | Admitting: Physician Assistant

## 2019-10-14 ENCOUNTER — Encounter
Admission: RE | Admit: 2019-10-14 | Discharge: 2019-10-14 | Disposition: A | Discharge status: Home / Self Care | Payer: BC Managed Care – PPO | Source: Ambulatory Visit | Attending: Obstetrics and Gynecology | Admitting: Obstetrics and Gynecology

## 2019-10-14 DIAGNOSIS — E119 Type 2 diabetes mellitus without complications: Secondary | ICD-10-CM

## 2019-10-14 NOTE — Patient Instructions (Signed)
Your procedure is scheduled on: 10-22-19 TUESDAY Report to Same Day Surgery 2nd floor medical mall [facility] Entrance-take elevator on left to 2nd floor.  Check in with surgery information desk.) To find out your arrival time please call [phone] between 1PM - 3PM on 10-18-19 FRIDAY  Remember: Instructions that are not followed completely may result in serious medical risk, up to and including death, or upon the discretion of your surgeon and anesthesiologist your surgery may need to be rescheduled.    _x___ 1. Do not eat food after midnight the night before your procedure. NO GUM OR CANDY AFTER MIDNIGHT. You may drink WATER liquids up to 2 hours before you are scheduled to arrive at the hospital for your procedure.  Do not drink WATER within 2 hours of your scheduled arrival to the hospital.  Type 1 and type 2 diabetics should only drink water.   _X___GATORADE G2-FINISH DRINK 2 HOURS PRIOR TO ARRIVAL TIME TO HOSPITAL THE DAY OF YOUR SURGERY     __x__ 2. No Alcohol for 24 hours before or after surgery.   __x__3. No Smoking or e-cigarettes for 24 prior to surgery.  Do not use any chewable tobacco products for at least 6 hour prior to surgery   ____  4. Bring all medications with you on the day of surgery if instructed.    __x__ 5. Notify your doctor if there is any change in your medical condition     (cold, fever, infections).    x___6. On the morning of surgery brush your teeth with toothpaste and water.  You may rinse your mouth with mouth wash if you wish.  Do not swallow any toothpaste or mouthwash.   Do not wear jewelry, make-up, hairpins, clips or nail polish.  Do not wear lotions, powders, or perfumes. You may wear deodorant.  Do not shave 48 hours prior to surgery. Men may shave face and neck.  Do not bring valuables to the hospital.    [facility] is not responsible for any belongings or valuables.               Contacts, dentures or bridgework may not be worn into  surgery.  Leave your suitcase in the car. After surgery it may be brought to your room.  For patients admitted to the hospital, discharge time is determined by your treatment team.  _  Patients discharged the day of surgery will not be allowed to drive home.  You will need someone to drive you home and stay with you the night of your procedure.    Please read over the following fact sheets that you were given:   [facility] Preparing for Surgery   ____ TAKE THE FOLLOWING MEDICATION THE MORNING OF SURGERY WITH A SMALL SIP OF WATER. These include:  1. NONE  2.  3.  4.  5.  6.  ____Fleets enema or Magnesium Citrate as directed.   ____ Use CHG Soap or sage wipes as directed on instruction sheet   ____ Use inhalers on the day of surgery and bring to hospital day of surgery  _X___ Stop Metformin 2 days prior to surgery-LAST DOSE ON Saturday 10-19-19    ____ Take 1/2 of usual insulin dose the night before surgery and none on the morning surgery.   ____ Follow recommendations from Cardiologist, Pulmonologist or PCP regarding stopping Aspirin, Coumadin, Plavix ,Eliquis, Effient, or Pradaxa, and Pletal.  X____Stop Anti-inflammatories such as Advil, Aleve, Ibuprofen, Motrin, Naproxen, Naprosyn, Goodies powders  or aspirin products NOW-OK to take Tylenol    ____ Stop supplements until after surgery.   ____ Bring C-Pap to the hospital.

## 2019-10-17 ENCOUNTER — Telehealth: Payer: Self-pay

## 2019-10-17 NOTE — Telephone Encounter (Signed)
Pt calling; having endometrial ablation Tues c CRS; has period related question.  [phone]  Pt is probably going to be on her period by Tues.  Will that be okay.  Adv yes it will be okay for her to be on her period for the procedure.

## 2019-10-18 ENCOUNTER — Inpatient Hospital Stay: Admission: RE | Admit: 2019-10-18 | Payer: BC Managed Care – PPO | Source: Ambulatory Visit

## 2019-10-18 ENCOUNTER — Other Ambulatory Visit: Payer: Self-pay

## 2019-10-18 ENCOUNTER — Telehealth: Payer: Self-pay

## 2019-10-18 ENCOUNTER — Encounter
Admission: RE | Admit: 2019-10-18 | Discharge: 2019-10-18 | Disposition: A | Discharge status: Home / Self Care | Payer: BC Managed Care – PPO | Source: Ambulatory Visit | Attending: Obstetrics and Gynecology | Admitting: Obstetrics and Gynecology

## 2019-10-18 ENCOUNTER — Other Ambulatory Visit: Payer: BC Managed Care – PPO

## 2019-10-18 DIAGNOSIS — Z20822 Contact with and (suspected) exposure to covid-19: Secondary | ICD-10-CM | POA: Insufficient documentation

## 2019-10-18 DIAGNOSIS — E119 Type 2 diabetes mellitus without complications: Secondary | ICD-10-CM | POA: Insufficient documentation

## 2019-10-18 DIAGNOSIS — Z01818 Encounter for other preprocedural examination: Secondary | ICD-10-CM | POA: Diagnosis not present

## 2019-10-18 LAB — TYPE AND SCREEN
ABO/RH(D): A POS
Antibody Screen: NEGATIVE

## 2019-10-18 LAB — CBC
HCT: 38.1 % (ref 36.0–46.0)
Hemoglobin: 12.9 g/dL (ref 12.0–15.0)
MCH: 29.6 pg (ref 26.0–34.0)
MCHC: 33.9 g/dL (ref 30.0–36.0)
MCV: 87.4 fL (ref 80.0–100.0)
Platelets: 266 10*3/uL (ref 150–400)
RBC: 4.36 MIL/uL (ref 3.87–5.11)
RDW: 13.5 % (ref 11.5–15.5)
WBC: 11.8 10*3/uL — ABNORMAL HIGH (ref 4.0–10.5)
nRBC: 0 % (ref 0.0–0.2)

## 2019-10-18 LAB — SARS CORONAVIRUS 2 (TAT 6-24 HRS): SARS Coronavirus 2: NEGATIVE

## 2019-10-18 NOTE — Telephone Encounter (Signed)
Pt calling; is having an endo abl Tues; has another question.  [phone]  Pt wants to be sure procedure will still be done if she is not on her period.  Adv it would.

## 2019-10-22 ENCOUNTER — Ambulatory Visit: Payer: BC Managed Care – PPO | Admitting: Anesthesiology

## 2019-10-22 ENCOUNTER — Other Ambulatory Visit: Payer: Self-pay

## 2019-10-22 ENCOUNTER — Encounter: Payer: Self-pay | Admitting: Obstetrics and Gynecology

## 2019-10-22 ENCOUNTER — Ambulatory Visit
Admission: RE | Admit: 2019-10-22 | Discharge: 2019-10-22 | Disposition: A | Discharge status: Home / Self Care | Payer: BC Managed Care – PPO | Attending: Obstetrics and Gynecology | Admitting: Obstetrics and Gynecology

## 2019-10-22 ENCOUNTER — Ambulatory Visit: Payer: BC Managed Care – PPO | Admitting: Urgent Care

## 2019-10-22 ENCOUNTER — Encounter
Admission: RE | Disposition: A | Discharge status: Home / Self Care | Payer: Self-pay | Source: Home / Self Care | Attending: Obstetrics and Gynecology

## 2019-10-22 DIAGNOSIS — N920 Excessive and frequent menstruation with regular cycle: Secondary | ICD-10-CM | POA: Insufficient documentation

## 2019-10-22 DIAGNOSIS — Z7984 Long term (current) use of oral hypoglycemic drugs: Secondary | ICD-10-CM | POA: Diagnosis not present

## 2019-10-22 DIAGNOSIS — F17200 Nicotine dependence, unspecified, uncomplicated: Secondary | ICD-10-CM | POA: Diagnosis not present

## 2019-10-22 DIAGNOSIS — F329 Major depressive disorder, single episode, unspecified: Secondary | ICD-10-CM | POA: Insufficient documentation

## 2019-10-22 DIAGNOSIS — E785 Hyperlipidemia, unspecified: Secondary | ICD-10-CM | POA: Insufficient documentation

## 2019-10-22 DIAGNOSIS — Z79899 Other long term (current) drug therapy: Secondary | ICD-10-CM | POA: Diagnosis not present

## 2019-10-22 DIAGNOSIS — E1165 Type 2 diabetes mellitus with hyperglycemia: Secondary | ICD-10-CM | POA: Diagnosis not present

## 2019-10-22 DIAGNOSIS — E119 Type 2 diabetes mellitus without complications: Secondary | ICD-10-CM | POA: Diagnosis not present

## 2019-10-22 DIAGNOSIS — Z880 Allergy status to penicillin: Secondary | ICD-10-CM | POA: Insufficient documentation

## 2019-10-22 DIAGNOSIS — E7800 Pure hypercholesterolemia, unspecified: Secondary | ICD-10-CM | POA: Diagnosis not present

## 2019-10-22 HISTORY — PX: HYSTEROSCOPY WITH NOVASURE: SHX5574

## 2019-10-22 HISTORY — PX: HYSTEROSCOPY WITH D & C: SHX1775

## 2019-10-22 LAB — GLUCOSE, CAPILLARY
Glucose-Capillary: 170 mg/dL — ABNORMAL HIGH (ref 70–99)
Glucose-Capillary: 185 mg/dL — ABNORMAL HIGH (ref 70–99)

## 2019-10-22 LAB — ABO/RH: ABO/RH(D): A POS

## 2019-10-22 LAB — POCT PREGNANCY, URINE: Preg Test, Ur: NEGATIVE

## 2019-10-22 SURGERY — DILATATION AND CURETTAGE /HYSTEROSCOPY
Anesthesia: General

## 2019-10-22 MED ORDER — FENTANYL CITRATE (PF) 100 MCG/2ML IJ SOLN
INTRAMUSCULAR | Status: DC | PRN
  Administered 2019-10-22: 50 ug via INTRAVENOUS

## 2019-10-22 MED ORDER — KETOROLAC TROMETHAMINE 30 MG/ML IJ SOLN
INTRAMUSCULAR | Status: DC | PRN
  Administered 2019-10-22: 30 mg via INTRAVENOUS

## 2019-10-22 MED ORDER — FAMOTIDINE 20 MG PO TABS: 20.0000 mg | ORAL_TABLET | Freq: Once | ORAL | Status: AC

## 2019-10-22 MED ORDER — SUCCINYLCHOLINE CHLORIDE 200 MG/10ML IV SOSY
PREFILLED_SYRINGE | INTRAVENOUS | Status: AC
  Filled 2019-10-22: qty 10

## 2019-10-22 MED ORDER — OXYCODONE HCL 5 MG/5ML PO SOLN: 5.0000 mg | Freq: Once | ORAL | Status: DC | PRN

## 2019-10-22 MED ORDER — SODIUM CHLORIDE 0.9 % IV SOLN: INTRAVENOUS | Status: DC

## 2019-10-22 MED ORDER — SUCCINYLCHOLINE CHLORIDE 20 MG/ML IJ SOLN
INTRAMUSCULAR | Status: DC | PRN
  Administered 2019-10-22: 100 mg via INTRAVENOUS

## 2019-10-22 MED ORDER — EPHEDRINE 5 MG/ML INJ
INTRAVENOUS | Status: AC
  Filled 2019-10-22: qty 10

## 2019-10-22 MED ORDER — FENTANYL CITRATE (PF) 100 MCG/2ML IJ SOLN
INTRAMUSCULAR | Status: AC
  Filled 2019-10-22: qty 2

## 2019-10-22 MED ORDER — PROPOFOL 500 MG/50ML IV EMUL
INTRAVENOUS | Status: AC
  Filled 2019-10-22: qty 50

## 2019-10-22 MED ORDER — OXYCODONE HCL 5 MG PO TABS: 5.0000 mg | ORAL_TABLET | Freq: Once | ORAL | Status: DC | PRN

## 2019-10-22 MED ORDER — SUGAMMADEX SODIUM 500 MG/5ML IV SOLN
INTRAVENOUS | Status: DC | PRN
  Administered 2019-10-22: 200 mg via INTRAVENOUS

## 2019-10-22 MED ORDER — IBUPROFEN 800 MG PO TABS: 800.0000 mg | ORAL_TABLET | Freq: Three times a day (TID) | ORAL | 1 refills | Status: DC | PRN

## 2019-10-22 MED ORDER — DEXAMETHASONE SODIUM PHOSPHATE 10 MG/ML IJ SOLN
INTRAMUSCULAR | Status: DC | PRN
  Administered 2019-10-22: 10 mg via INTRAVENOUS

## 2019-10-22 MED ORDER — LACTATED RINGERS IV SOLN: INTRAVENOUS | Status: DC | PRN

## 2019-10-22 MED ORDER — ONDANSETRON HCL 4 MG/2ML IJ SOLN
INTRAMUSCULAR | Status: DC | PRN
  Administered 2019-10-22: 4 mg via INTRAVENOUS

## 2019-10-22 MED ORDER — LACTATED RINGERS IV SOLN: INTRAVENOUS | Status: DC

## 2019-10-22 MED ORDER — FAMOTIDINE 20 MG PO TABS
ORAL_TABLET | ORAL | Status: AC
  Administered 2019-10-22: 20 mg via ORAL
  Filled 2019-10-22: qty 1

## 2019-10-22 MED ORDER — PROPOFOL 10 MG/ML IV BOLUS
INTRAVENOUS | Status: DC | PRN
  Administered 2019-10-22: 150 mg via INTRAVENOUS

## 2019-10-22 MED ORDER — ROCURONIUM BROMIDE 10 MG/ML (PF) SYRINGE
PREFILLED_SYRINGE | INTRAVENOUS | Status: AC
  Filled 2019-10-22: qty 10

## 2019-10-22 MED ORDER — CHLORHEXIDINE GLUCONATE 0.12 % MT SOLN
OROMUCOSAL | Status: AC
  Administered 2019-10-22: 15 mL via OROMUCOSAL
  Filled 2019-10-22: qty 15

## 2019-10-22 MED ORDER — MIDAZOLAM HCL 2 MG/2ML IJ SOLN
INTRAMUSCULAR | Status: AC
  Filled 2019-10-22: qty 2

## 2019-10-22 MED ORDER — LIDOCAINE HCL (CARDIAC) PF 100 MG/5ML IV SOSY
PREFILLED_SYRINGE | INTRAVENOUS | Status: DC | PRN
  Administered 2019-10-22: 100 mg via INTRAVENOUS

## 2019-10-22 MED ORDER — FENTANYL CITRATE (PF) 100 MCG/2ML IJ SOLN
25.0000 ug | INTRAMUSCULAR | Status: DC | PRN
  Administered 2019-10-22: 50 ug via INTRAVENOUS

## 2019-10-22 MED ORDER — DEXMEDETOMIDINE HCL 200 MCG/2ML IV SOLN
INTRAVENOUS | Status: DC | PRN
  Administered 2019-10-22: 16 ug via INTRAVENOUS

## 2019-10-22 MED ORDER — ACETAMINOPHEN 500 MG PO TABS
1000.0000 mg | ORAL_TABLET | Freq: Four times a day (QID) | ORAL | 0 refills | Status: AC | PRN
Start: 2019-10-22 — End: 2020-10-21

## 2019-10-22 MED ORDER — FENTANYL CITRATE (PF) 100 MCG/2ML IJ SOLN
INTRAMUSCULAR | Status: AC
  Administered 2019-10-22: 50 ug via INTRAVENOUS
  Filled 2019-10-22: qty 2

## 2019-10-22 MED ORDER — PHENYLEPHRINE HCL (PRESSORS) 10 MG/ML IV SOLN
INTRAVENOUS | Status: AC
  Filled 2019-10-22: qty 1

## 2019-10-22 MED ORDER — CHLORHEXIDINE GLUCONATE 0.12 % MT SOLN: 15.0000 mL | Freq: Once | OROMUCOSAL | Status: AC

## 2019-10-22 MED ORDER — ONDANSETRON HCL 4 MG/2ML IJ SOLN
INTRAMUSCULAR | Status: AC
  Filled 2019-10-22: qty 2

## 2019-10-22 MED ORDER — LIDOCAINE HCL (PF) 2 % IJ SOLN
INTRAMUSCULAR | Status: AC
  Filled 2019-10-22: qty 5

## 2019-10-22 MED ORDER — DEXAMETHASONE SODIUM PHOSPHATE 10 MG/ML IJ SOLN
INTRAMUSCULAR | Status: AC
  Filled 2019-10-22: qty 1

## 2019-10-22 MED ORDER — ORAL CARE MOUTH RINSE: 15.0000 mL | Freq: Once | OROMUCOSAL | Status: AC

## 2019-10-22 SURGICAL SUPPLY — 37 items
ABLATOR SURESOUND NOVASURE (ABLATOR) ×2 IMPLANT
BAG URINE DRAIN 2000ML AR STRL (UROLOGICAL SUPPLIES) ×2 IMPLANT
CATH FOLEY 2WAY  5CC 16FR (CATHETERS)
CATH ROBINSON RED A/P 16FR (CATHETERS) ×2 IMPLANT
CATH URTH 16FR FL 2W BLN LF (CATHETERS) IMPLANT
COVER WAND RF STERILE (DRAPES) ×2 IMPLANT
DEVICE MYOSURE LITE (MISCELLANEOUS) IMPLANT
DEVICE MYOSURE REACH (MISCELLANEOUS) IMPLANT
ELECT REM PT RETURN 9FT ADLT (ELECTROSURGICAL)
ELECTRODE REM PT RTRN 9FT ADLT (ELECTROSURGICAL) IMPLANT
GAUZE 4X4 16PLY RFD (DISPOSABLE) ×2 IMPLANT
GLOVE BIOGEL PI IND STRL 6.5 (GLOVE) ×2 IMPLANT
GLOVE BIOGEL PI IND STRL 7.0 (GLOVE) ×1 IMPLANT
GLOVE BIOGEL PI INDICATOR 6.5 (GLOVE) ×2
GLOVE BIOGEL PI INDICATOR 7.0 (GLOVE) ×1
GLOVE SURG SYN 6.5 ES PF (GLOVE) ×2 IMPLANT
GLOVE SURG SYN 7.0 (GLOVE) ×2 IMPLANT
GOWN STRL REUS W/ TWL LRG LVL3 (GOWN DISPOSABLE) ×2 IMPLANT
GOWN STRL REUS W/TWL LRG LVL3 (GOWN DISPOSABLE) ×2
HANDPIECE ABLA MINERVA ENDO (MISCELLANEOUS) IMPLANT
IV LACTATED RINGER IRRG 3000ML (IV SOLUTION)
IV LACTATED RINGERS 1000ML (IV SOLUTION) IMPLANT
IV LR IRRIG 3000ML ARTHROMATIC (IV SOLUTION) IMPLANT
IV NS 1000ML (IV SOLUTION) ×1
IV NS 1000ML BAXH (IV SOLUTION) ×1 IMPLANT
KIT PROCEDURE FLUENT (KITS) IMPLANT
KIT TURNOVER CYSTO (KITS) ×2 IMPLANT
NS IRRIG 500ML POUR BTL (IV SOLUTION) ×2 IMPLANT
PACK DNC HYST (MISCELLANEOUS) ×2 IMPLANT
PAD OB MATERNITY 4.3X12.25 (PERSONAL CARE ITEMS) ×2 IMPLANT
PAD PREP 24X41 OB/GYN DISP (PERSONAL CARE ITEMS) ×2 IMPLANT
SEAL ROD LENS SCOPE MYOSURE (ABLATOR) ×2 IMPLANT
SOL .9 NS 3000ML IRR  AL (IV SOLUTION)
SOL .9 NS 3000ML IRR UROMATIC (IV SOLUTION) IMPLANT
SYR 10ML LL (SYRINGE) ×2 IMPLANT
TOWEL OR 17X26 4PK STRL BLUE (TOWEL DISPOSABLE) ×2 IMPLANT
TUBING CONNECTING 10 (TUBING) ×2 IMPLANT

## 2019-10-22 NOTE — Anesthesia Procedure Notes (Signed)
Procedure Name: Intubation Date/Time: 10/22/2019 7:38 AM Performed by: Almeta Monas, CRNA Pre-anesthesia Checklist: Patient identified, Patient being monitored, Timeout performed, Emergency Drugs available and Suction available Patient Re-evaluated:Patient Re-evaluated prior to induction Oxygen Delivery Method: Circle system utilized Preoxygenation: Pre-oxygenation with 100% oxygen Induction Type: IV induction Ventilation: Mask ventilation without difficulty Laryngoscope Size: 3 and McGraph Grade View: Grade I Tube type: Oral Tube size: 7.0 mm Number of attempts: 1 Airway Equipment and Method: Stylet and Video-laryngoscopy Placement Confirmation: ETT inserted through vocal cords under direct vision,  positive ETCO2 and breath sounds checked- equal and bilateral Secured at: 21 cm Tube secured with: Tape Dental Injury: Teeth and Oropharynx as per pre-operative assessment

## 2019-10-22 NOTE — Anesthesia Postprocedure Evaluation (Signed)
Anesthesia Post Note  Patient: MARYBELLE GIRALDO  Procedure(s) Performed: DILATATION AND CURETTAGE /HYSTEROSCOPY W ABLATION (N/A ) HYSTEROSCOPY WITH NOVASURE (N/A )  Patient location during evaluation: PACU Anesthesia Type: General Level of consciousness: awake and alert Pain management: pain level controlled Vital Signs Assessment: post-procedure vital signs reviewed and stable Respiratory status: spontaneous breathing, nonlabored ventilation, respiratory function stable and patient connected to nasal cannula oxygen Cardiovascular status: blood pressure returned to baseline and stable Postop Assessment: no apparent nausea or vomiting Anesthetic complications: no   No complications documented.   Last Vitals:  Vitals:   10/22/19 0848 10/22/19 0903  BP: 122/81 112/74  Pulse: 90 79  Resp: 18 15  Temp:  36.7 C  SpO2: 98% 97%    Last Pain:  Vitals:   10/22/19 0903  TempSrc:   PainSc: 2                  Cleda Mccreedy Wright [place]

## 2019-10-22 NOTE — Op Note (Signed)
Operative Note  10/22/2019  PRE-OP DIAGNOSIS: Menorrhagia  POST-OP DIAGNOSIS: same   SURGEON: Nelida Mandarino MD  PROCEDURE: Procedure(s): DILATATION AND CURETTAGE /HYSTEROSCOPY W ABLATION HYSTEROSCOPY WITH NOVASURE   ANESTHESIA: Choice   ESTIMATED BLOOD LOSS: 10 cc   SPECIMENS:  Endometrial currettings  COMPLICATIONS: none  DISPOSITION: PACU - hemodynamically stable.  CONDITION: stable  FINDINGS: Exam under anesthesia revealed  9cm uterus with bilateral adnexa without masses or fullness. Hysteroscopy revealed a normal uterine cavity with bilateral tubal ostia and normal appearing endocervical canal.  PROCEDURE IN DETAIL: After informed consent was obtained, the patient was taken to the operating room where anesthesia was obtained without difficulty. The patient was positioned in the dorsal lithotomy position in [place] stirrups. The patient's bladder was catheterized with an in and out foley catheter. The patient was examined under anesthesia, with the above noted findings. The weightedspeculum was placed inside the patient's vagina, and the the anterior lip of the cervix was seen and grasped with the tenaculum.  The uterine cavity was sounded to 9cm, and then the cervix was progressively dilated to a 8 Hegar dilator. The 0 degree hysteroscope was introduced, with saline fluid used to distend the intrauterine cavity, with the above noted findings.  The hysteroscope was removed.  The uterine cavity was curetted until a gritty texture was noted, yielding endometrial curettings.   The Novasure endometrial ablation device was then placed without difficulty. Measurements were obtained. Patient was noted to have a uterine length of 5.5 cm, a cervical length of 3.5 cm, and a cervical width of 3 cm. The device is first tested and after confirmation the procedures performed. Length of procedure was 65 seconds. The power was 91. The ablation device is then removed and repeat hysteroscopy  revealed an appropriate lining of the uterus and no perforation or injury.   All instruments were removed, with excellent hemostasis noted throughout. She was then taken out of dorsal lithotomy. Minimal discrepancy in fluid was noted.  The patient tolerated the procedure well. Sponge, lap and needle counts were correct x2. The patient was taken to recovery room in excellent condition.  Bethany Idler MD [facility], [facility] 10/22/2019 8:20 AM

## 2019-10-22 NOTE — Discharge Instructions (Addendum)
Endometrial Ablation, Care After This sheet gives you information about how to care for yourself after your procedure. Your health care provider may also give you more specific instructions. If you have problems or questions, contact your health care provider. What can I expect after the procedure? After the procedure, it is common to have:  A need to urinate more frequently than usual for the first 24 hours.  Cramps similar to menstrual cramps. These may last for 1-2 days.  Thin, watery vaginal discharge that is light pink or brown in color. This may last a few weeks. Discharge will be heavy for the first few days after your procedure. You may need to wear a sanitary pad.  Nausea.  Vaginal bleeding for 4-6 weeks after the procedure, as tissue healing occurs. Follow these instructions at home: Activity  Do not drive for 24 hours if you were given amedicine to help you relax (sedative) during your procedure.  Do not have sex or put anything into your vagina until your health care provider approves.  Do not lift anything that is heavier than 10 lb (4.5 kg), or the limit that you are told, until your health care provider says that it is safe.  Return to your normal activities as told by your health care provider. Ask your health care provider what activities are safe for you. General instructions   Take over-the-counter and prescription medicines only as told by your health care provider.  Do not take baths, swim, or use a hot tub until your health care provider approves. You will be able to take showers.  Check your vaginal area every day for signs of infection. Check for: ? Redness, swelling, or pain. ? More discharge or blood, instead of less. ? Bad-smelling discharge.  Keep all follow-up visits as told by your health care provider. This is important.  Drink enough fluid to keep your urine pale yellow. Contact a health care provider if you have:  Vaginal redness, swelling, or  pain.  Vaginal discharge or bleeding that gets worse instead of getting better.  Bad-smelling vaginal discharge.  A fever or chills.  Trouble urinating. Get help right away if you have:  Heavy vaginal bleeding.  Severe cramps. Summary  After endometrial ablation, it is normal to have thin, watery vaginal discharge that is light pink or brown in color. This may last a few weeks and may be heavier right after the procedure.  Vaginal bleeding is also normal after the procedure and should get better with time.  Check your vaginal area every day for signs of infection, such as bad-smelling discharge.  Keep all follow-up visits as told by your health care provider. This is important. This information is not intended to replace advice given to you by your health care provider. Make sure you discuss any questions you have with your health care provider. Document Revised: 07/26/2018 Document Reviewed: 02/14/2017 Elsevier Patient Education  2020 [employer]   AMBULATORY SURGERY  DISCHARGE INSTRUCTIONS   1) The drugs that you were given will stay in your system until tomorrow so for the next 24 hours you should not:  A) Drive an automobile B) Make any legal decisions C) Drink any alcoholic beverage   2) You may resume regular meals tomorrow.  Today it is better to start with liquids and gradually work up to solid foods.  You may eat anything you prefer, but it is better to start with liquids, then soup and crackers, and gradually work up to solid   foods.   3) Please notify your doctor immediately if you have any unusual bleeding, trouble breathing, redness and pain at the surgery site, drainage, fever, or pain not relieved by medication.    4) Additional Instructions:        Please contact your physician with any problems or Same Day Surgery at [phone], Monday through Friday 6 am to 4 pm, or [place] [facility] [place] Main number at [phone]. 

## 2019-10-22 NOTE — Interval H&P Note (Signed)
History and Physical Interval Note:  10/22/2019 7:06 AM  Bethany Edwards  has presented today for surgery, with the diagnosis of Menorrhagia N92.0.  The various methods of treatment have been discussed with the patient and family. After consideration of risks, benefits and other options for treatment, the patient has consented to  Procedure(s): DILATATION AND CURETTAGE /HYSTEROSCOPY W ABLATION (N/A) HYSTEROSCOPY WITH NOVASURE (N/A) as a surgical intervention.  The patient's history has been reviewed, patient examined, no change in status, stable for surgery.  I have reviewed the patient's chart and labs.  Questions were answered to the patient's satisfaction.     Derron Pipkins R [place]

## 2019-10-22 NOTE — Anesthesia Preprocedure Evaluation (Addendum)
Anesthesia Evaluation  Patient identified by MRN, date of birth, ID band Patient awake    Reviewed: Allergy & Precautions, H&P , NPO status , Patient's Chart, lab work & pertinent test results  History of Anesthesia Complications Negative for: history of anesthetic complications  Airway Mallampati: II  TM Distance: >3 FB Neck ROM: full    Dental  (+) Chipped, Poor Dentition, Missing, Partial Upper   Pulmonary neg shortness of breath, Current Smoker and Patient abstained from smoking.,    Pulmonary exam normal        Cardiovascular Exercise Tolerance: Good (-) angina(-) Past MI and (-) DOE negative cardio ROS Normal cardiovascular exam     Neuro/Psych PSYCHIATRIC DISORDERS negative neurological ROS     GI/Hepatic negative GI ROS, Neg liver ROS, neg GERD  ,  Endo/Other  diabetes, Type 2  Renal/GU      Musculoskeletal   Abdominal   Peds  Hematology negative hematology ROS (+)   Anesthesia Other Findings Past Medical History: No date: Depression No date: Diabetes mellitus without complication (HCC)     Comment:  Type II No date: Hyperlipidemia  Past Surgical History: 2003 and 2007: INCISION AND DRAINAGE ABSCESS; Left     Comment:  Axillary Abscess 2003: TUBAL LIGATION  BMI    Body Mass Index: 29.76 kg/m      Reproductive/Obstetrics negative OB ROS                            Anesthesia Physical Anesthesia Plan  ASA: III  Anesthesia Plan: General ETT   Post-op Pain Management:    Induction: Intravenous  PONV Risk Score and Plan: Ondansetron, Dexamethasone, Midazolam and Treatment may vary due to age or medical condition  Airway Management Planned: Oral ETT  Additional Equipment:   Intra-op Plan:   Post-operative Plan: Extubation in OR  Informed Consent: I have reviewed the patients History and Physical, chart, labs and discussed the procedure including the risks,  benefits and alternatives for the proposed anesthesia with the patient or authorized representative who has indicated his/her understanding and acceptance.     Dental Advisory Given  Plan Discussed with: Anesthesiologist, CRNA and Surgeon  Anesthesia Plan Comments: (Patient reports that she can not remove the metal stud that is under her tongue (through her frenulum) and that removing her upper partial at this time would cause it to break more (that partial is already somewhat broken but very secure with denture adhesive.  No loose parts on partial per her report).  She was consented that leaving the stud and partial in increase her risk for dental/oral injury and increase her risk for damage to the stud and or partial.  Also consented for risk of aspiration of broken stud and or partial.  She voiced understanding.  Plan to intubate for dental protection instead of placing LMA.  Patient consented for risks of anesthesia including but not limited to:  - adverse reactions to medications - damage to eyes, teeth, lips or other oral mucosa - nerve damage due to positioning  - sore throat or hoarseness - Damage to heart, brain, nerves, lungs, other parts of body or loss of life  Patient voiced understanding.)      Anesthesia Quick Evaluation

## 2019-10-22 NOTE — Transfer of Care (Signed)
Immediate Anesthesia Transfer of Care Note  Patient: Bethany Edwards  Procedure(s) Performed: DILATATION AND CURETTAGE /HYSTEROSCOPY W ABLATION (N/A ) HYSTEROSCOPY WITH NOVASURE (N/A )  Patient Location: PACU  Anesthesia Type:General  Level of Consciousness: sedated  Airway & Oxygen Therapy: Patient Spontanous Breathing  Post-op Assessment: Stable Post vital signs: Reviewed and stable  Last Vitals:  Vitals Value Taken Time  BP 114/79 10/22/19 0834  Temp 36.6 C 10/22/19 0833  Pulse 88 10/22/19 0837  Resp 18 10/22/19 0837  SpO2 98 % 10/22/19 0837  Vitals shown include unvalidated device data.  Last Pain:  Vitals:   10/22/19 0833  TempSrc:   PainSc: Asleep         Complications: No complications documented.

## 2019-10-22 NOTE — Progress Notes (Signed)
CBG 185 

## 2019-10-23 ENCOUNTER — Encounter: Payer: Self-pay | Admitting: Obstetrics and Gynecology

## 2019-10-23 LAB — SURGICAL PATHOLOGY

## 2019-11-01 ENCOUNTER — Other Ambulatory Visit: Payer: Self-pay

## 2019-11-01 ENCOUNTER — Ambulatory Visit: Payer: BC Managed Care – PPO | Admitting: Obstetrics and Gynecology

## 2019-11-01 ENCOUNTER — Ambulatory Visit (INDEPENDENT_AMBULATORY_CARE_PROVIDER_SITE_OTHER): Payer: BC Managed Care – PPO | Admitting: Obstetrics and Gynecology

## 2019-11-01 ENCOUNTER — Encounter: Payer: Self-pay | Admitting: Obstetrics and Gynecology

## 2019-11-01 VITALS — BP 124/70 | HR 99 | Resp 16 | Ht 63.0 in | Wt 165.4 lb

## 2019-11-01 DIAGNOSIS — Z9889 Other specified postprocedural states: Secondary | ICD-10-CM

## 2019-11-01 NOTE — Progress Notes (Signed)
  Postoperative Follow-up Patient presents post op from hysterosocpy D&C, novasure ablation for abnormal uterine bleeding, 1 week ago.  Subjective: Patient reports marked improvement in her preop symptoms. Eating a regular diet without difficulty. The patient is not having any pain.  Activity: normal activities of daily living. Patient reports additional symptom's since surgery of thin grey discharge as expected.   Objective: BP 124/70   Pulse 99   Resp 16   Ht 5\' 3"  (1.6 m)   Wt 165 lb 6.4 oz (75 kg)   SpO2 100%   BMI 29.30 kg/m  Physical Exam Constitutional:      Appearance: She is well-developed.  Genitourinary:     Vagina and uterus normal.     No lesions in the vagina.     No cervical motion tenderness.     No right or left adnexal mass present.  HENT:     Head: Normocephalic and atraumatic.  Neck:     Thyroid: No thyromegaly.  Cardiovascular:     Rate and Rhythm: Normal rate and regular rhythm.     Heart sounds: Normal heart sounds.  Pulmonary:     Effort: Pulmonary effort is normal.     Breath sounds: Normal breath sounds.  Chest:     Breasts:        Right: No inverted nipple, mass, nipple discharge or skin change.        Left: No inverted nipple, mass, nipple discharge or skin change.  Abdominal:     General: Bowel sounds are normal. There is no distension.     Palpations: Abdomen is soft. There is no mass.  Musculoskeletal:     Cervical back: Neck supple.  Neurological:     Mental Status: She is alert and oriented to person, place, and time.  Skin:    General: Skin is warm and dry.  Psychiatric:        Behavior: Behavior normal.        Thought Content: Thought content normal.        Judgment: Judgment normal.  Vitals reviewed.    Assessment: s/p :  operative hysteroscopy, novasure ablation, stable  Plan: Patient has done well after surgery with no apparent complications.  I have discussed the post-operative course to date, and the expected progress  moving forward.  The patient understands what complications to be concerned about.  I will see the patient in routine follow up, or sooner if needed.    Activity plan: No restriction.  Gatlyn Lipari R Siobahn Worsley 11/01/2019, 3:59 PM

## 2019-11-07 ENCOUNTER — Other Ambulatory Visit: Payer: Self-pay | Admitting: Obstetrics and Gynecology

## 2019-11-14 NOTE — Telephone Encounter (Signed)
Add her on schedule today or tomorrow as work in w MD.  Discussed w Dr Jerene Pitch as well.

## 2019-11-14 NOTE — Telephone Encounter (Signed)
Pt left msg on triage line still having concerns after endometrial ablation. Has been bleeding since July 19th, changing pads every 4-5 hrs. She is having some thick pus looking discharge that smells horrible (like bacteria). Low grade fever of 99 at some point. Please advise. Appt with CS isnt until 8/13.

## 2019-11-15 ENCOUNTER — Other Ambulatory Visit: Payer: Self-pay

## 2019-11-15 ENCOUNTER — Ambulatory Visit (INDEPENDENT_AMBULATORY_CARE_PROVIDER_SITE_OTHER): Payer: BC Managed Care – PPO | Admitting: Obstetrics and Gynecology

## 2019-11-15 ENCOUNTER — Encounter: Payer: Self-pay | Admitting: Obstetrics and Gynecology

## 2019-11-15 VITALS — BP 132/76 | HR 97 | Ht 63.0 in | Wt 161.0 lb

## 2019-11-15 DIAGNOSIS — N719 Inflammatory disease of uterus, unspecified: Secondary | ICD-10-CM | POA: Diagnosis not present

## 2019-11-15 MED ORDER — FLUCONAZOLE 150 MG PO TABS: 150.0000 mg | ORAL_TABLET | Freq: Once | ORAL | 0 refills | Status: AC

## 2019-11-15 MED ORDER — DOXYCYCLINE HYCLATE 100 MG PO CAPS: 100.0000 mg | ORAL_CAPSULE | Freq: Two times a day (BID) | ORAL | 0 refills | Status: DC

## 2019-11-15 NOTE — Telephone Encounter (Signed)
Scheduled

## 2019-11-15 NOTE — Progress Notes (Signed)
      Postoperative Follow-up Patient presents post op from hysteroscopy, D&C, novasure endometrial ablation 2weeks ago for abnormal uterine bleeding.  Subjective: Patient reports some improvement in her preop symptoms. Eating a regular diet without difficulty. Pain is controlled without any medications.  Activity: normal activities of daily living.  Has noted some increased vaginal discharge  Objective: Blood pressure (!) 132/76, pulse 97, height 5\' 3"  (1.6 m), weight 161 lb (73 kg).  General: NAD Pulmonary: no increased work of breathing Abdomen: soft, non-tender GU: normal external female genitalia normal cervix, no CMT, uterus normal in shape and contour, no adnexal tenderness or masses Extremities: no edema Neurologic: normal gait    Admission on 10/22/2019, Discharged on 10/22/2019  Component Date Value Ref Range Status  . ABO/RH(D) 10/22/2019    Final                   Value:A POS Performed at [facility], [address], [place], [place]   . Glucose-Capillary 10/22/2019 170* 70 - 99 mg/dL Final   Glucose reference range applies only to samples taken after fasting for at least 8 hours.  . Preg Test, Ur 10/22/2019 NEGATIVE  NEGATIVE Final   Comment:        THE SENSITIVITY OF THIS METHODOLOGY IS >24 mIU/mL   . SURGICAL PATHOLOGY 10/22/2019    Final-Edited                   Value:SURGICAL PATHOLOGY CASE: [id number] PATIENT: Bethany Edwards Surgical Pathology Report     Specimen Submitted: A. Endometrial curettings  Clinical History: Menorrhagia N92.0      DIAGNOSIS: A.  ENDOMETRIAL CURETTINGS: - SECRETORY ENDOMETRIUM WITH STROMAL AND GLANDULAR BREAKDOWN. - FRAGMENTS OF UNREMARKABLE ENDOCERVICAL GLANDULAR AND SQUAMOUS MUCOSA. - NEGATIVE FOR ATYPIA / EIN AND MALIGNANCY.  GROSS DESCRIPTION: A. Labeled: Endometrium curettage Received: Formalin Tissue fragment(s): Multiple Size: Aggregate, 3.2 x 3 x 0.8 cm Description: Received on  multiple Telfa pads are red soft tissue fragments and blood clot. Entirely submitted in cassettes 1-3.     Final Diagnosis performed by [id number], MD.   Electronically signed 10/23/2019 12:22:34PM The electronic signature indicates that the named Attending Pathologist has evaluated the specimen Technical component performed at [facility], [address], [place], [place] Lab: [phone] Dir: [phone], MD, MMM  Professional component performed at [facility], [address] [place], [place], [place] Lab: [phone] Dir: [phone]. Rubinas, MD   . Glucose-Capillary 10/22/2019 185* 70 - 99 mg/dL Final   Glucose reference range applies only to samples taken after fasting for at least 8 hours.    Assessment: 39 y.o. s/p hysteroscopy, D&C, novasure endometrial ablation stable  Plan: Patient has done well after surgery with no apparent complications.  I have discussed the post-operative course to date, and the expected progress moving forward.  The patient understands what complications to be concerned about.  I will see the patient in routine follow up, or sooner if needed.    Activity plan: No restriction.  - will check CBC - discussed low likelihood of endometritis in setting of ablation but given DMII will cover with doxycyline   44, MD, [facility], [facility] 11/15/2019, 2:17 PM

## 2019-11-16 ENCOUNTER — Other Ambulatory Visit: Payer: Self-pay | Admitting: Physician Assistant

## 2019-11-16 DIAGNOSIS — E1165 Type 2 diabetes mellitus with hyperglycemia: Secondary | ICD-10-CM

## 2019-11-16 LAB — CBC
Hematocrit: 40.7 % (ref 34.0–46.6)
Hemoglobin: 13.4 g/dL (ref 11.1–15.9)
MCH: 30 pg (ref 26.6–33.0)
MCHC: 32.9 g/dL (ref 31.5–35.7)
MCV: 91 fL (ref 79–97)
Platelets: 287 10*3/uL (ref 150–450)
RBC: 4.46 x10E6/uL (ref 3.77–5.28)
RDW: 13.1 % (ref 11.7–15.4)
WBC: 10.7 10*3/uL (ref 3.4–10.8)

## 2019-11-16 NOTE — Telephone Encounter (Signed)
Requested Prescriptions  Pending Prescriptions Disp Refills  . OZEMPIC, 0.25 OR 0.5 MG/DOSE, 2 MG/1.5ML SOPN [Pharmacy Med Name: OZEMPIC 0.25 OR 0.5MG /DOS 1X2MG  PEN] 1.5 mL 1    Sig: INJECT 0.5 MG UNDER THE SKIN ONCE A WEEK     Endocrinology:  Diabetes - GLP-1 Receptor Agonists Failed - 11/16/2019  8:25 AM      Failed - HBA1C is between 0 and 7.9 and within 180 days    Hgb A1c MFr Bld  Date Value Ref Range Status  08/26/2019 8.4 (H) 4.8 - 5.6 % Final    Comment:             Prediabetes: 5.7 - 6.4          Diabetes: >6.4          Glycemic control for adults with diabetes: <7.0          Passed - Valid encounter within last 6 months    Recent Outpatient Visits          2 months ago Annual physical exam   [facility] Joycelyn Man M, PA-C   10 months ago Type 2 diabetes mellitus with hyperglycemia, without long-term current use of insulin [facility])   [facility], [place], [place]   11 months ago Type 2 diabetes mellitus with hyperglycemia, without long-term current use of insulin [facility])   [facility] [place], [place], [place]   1 year ago Type 2 diabetes mellitus without complication, unspecified whether long term insulin use [facility])   [facility] [place], [place], [place]   1 year ago Left hip pain   [facility] Joycelyn Man [place], [place]

## 2019-11-29 ENCOUNTER — Ambulatory Visit: Payer: BC Managed Care – PPO | Admitting: Obstetrics and Gynecology

## 2019-12-30 DIAGNOSIS — E119 Type 2 diabetes mellitus without complications: Secondary | ICD-10-CM | POA: Diagnosis not present

## 2019-12-30 LAB — HM DIABETES EYE EXAM

## 2019-12-31 ENCOUNTER — Encounter: Payer: Self-pay | Admitting: Physician Assistant

## 2020-01-10 NOTE — Progress Notes (Deleted)
     Established patient visit   Patient: Bethany Edwards   DOB: 21-May-1980   39 y.o. Female  MRN: [id number] Visit Date: 01/13/2020  Today's healthcare provider: Margaretann Loveless, PA-C   No chief complaint on file.  Subjective    HPI  Diabetes Mellitus Type II, Follow-up  Lab Results  Component Value Date   HGBA1C 8.4 (H) 08/26/2019   HGBA1C 8.7 (H) 12/19/2018   HGBA1C 9.8 (A) 03/30/2018   Wt Readings from Last 3 Encounters:  11/15/19 161 lb (73 kg)  11/01/19 165 lb 6.4 oz (75 kg)  10/22/19 168 lb (76.2 kg)   Last seen for diabetes 4 months ago.  Management since then includes continue current medication plan.. She reports {excellent/good/fair/poor:19665} compliance with treatment. She {is/is not:21021397} having side effects. {document side effects if present:1} Symptoms: {Yes/No:20286} fatigue {Yes/No:20286} foot ulcerations  {Yes/No:20286} appetite changes {Yes/No:20286} nausea  {Yes/No:20286} paresthesia of the feet  {Yes/No:20286} polydipsia  {Yes/No:20286} polyuria {Yes/No:20286} visual disturbances   {Yes/No:20286} vomiting     Home blood sugar records: {diabetes glucometry results:16657}  Episodes of hypoglycemia? {Yes/No:20286} {enter symptoms and frequency of symptoms if yes:1}   Current insulin regiment: {enter 'none' or type of insulin and number of units taken with each dose of each insulin formulation that the patient is taking:1} Most Recent Eye Exam: *** {Current exercise:16438:::1} {Current diet habits:16563:::1}  Pertinent Labs: Lab Results  Component Value Date   CHOL 223 (H) 08/26/2019   HDL 27 (L) 08/26/2019   LDLCALC 154 (H) 08/26/2019   TRIG 226 (H) 08/26/2019   CHOLHDL 9.4 (H) 12/19/2018   Lab Results  Component Value Date   NA 136 08/26/2019   K 4.1 08/26/2019   CREATININE 0.68 08/26/2019   GFRNONAA 109 08/26/2019   GFRAA 126 08/26/2019   GLUCOSE 176 (H) 08/26/2019      ---------------------------------------------------------------------------------------------------  {Show patient history (optional):23778::" "}   Medications: Outpatient Medications Prior to Visit  Medication Sig  . acetaminophen (TYLENOL) 500 MG tablet Take 2 tablets (1,000 mg total) by mouth every 6 (six) hours as needed.  Marland Kitchen atorvastatin (LIPITOR) 20 MG tablet Take 1 tablet (20 mg total) by mouth daily. (Patient taking differently: Take 20 mg by mouth at bedtime. )  . Cholecalciferol (VITAMIN D) 50 MCG (2000 UT) CAPS Take 2,000 Units by mouth daily.  Marland Kitchen doxycycline (VIBRAMYCIN) 100 MG capsule Take 1 capsule (100 mg total) by mouth 2 (two) times daily.  Marland Kitchen ibuprofen (ADVIL) 800 MG tablet TAKE 1 TABLET(800 MG) BY MOUTH EVERY 8 HOURS AS NEEDED FOR CRAMPING  . metFORMIN (GLUCOPHAGE) 500 MG tablet TAKE 1 TABLET(500 MG) BY MOUTH TWICE DAILY WITH A MEAL (Patient taking differently: Take 500 mg by mouth 2 (two) times daily with a meal. )  . OZEMPIC, 0.25 OR 0.5 MG/DOSE, 2 MG/1.5ML SOPN INJECT 0.5 MG UNDER THE SKIN ONCE A WEEK   No facility-administered medications prior to visit.    Review of Systems  {Heme  Chem  Endocrine  Serology  Results Review (optional):23779::" "}  Objective    There were no vitals taken for this visit. {Show previous vital signs (optional):23777::" "}  Physical Exam  ***  No results found for any visits on 01/13/20.  Assessment & Plan     ***  No follow-ups on file.      {provider attestation***:1}   Reine Just  [facility] [phone] (phone) [phone] (fax)  [facility]

## 2020-01-13 ENCOUNTER — Telehealth: Payer: Self-pay

## 2020-01-13 ENCOUNTER — Ambulatory Visit: Payer: BC Managed Care – PPO | Admitting: Physician Assistant

## 2020-01-13 DIAGNOSIS — E1165 Type 2 diabetes mellitus with hyperglycemia: Secondary | ICD-10-CM | POA: Diagnosis not present

## 2020-01-13 NOTE — Telephone Encounter (Signed)
Patient was scheduled for A1C follow up at 7:40 but she thought she was just here for lab and got here round 8:10am

## 2020-01-14 ENCOUNTER — Encounter: Payer: Self-pay | Admitting: Physician Assistant

## 2020-01-14 DIAGNOSIS — E119 Type 2 diabetes mellitus without complications: Secondary | ICD-10-CM

## 2020-01-14 LAB — HEMOGLOBIN A1C
Est. average glucose Bld gHb Est-mCnc: 252 mg/dL
Hgb A1c MFr Bld: 10.4 % — ABNORMAL HIGH (ref 4.8–5.6)

## 2020-01-14 NOTE — Progress Notes (Signed)
Established patient visit   Patient: Bethany Edwards   DOB: [birth year]   39 y.o. Female  MRN: [id number] Visit Date: 01/15/2020  Today's healthcare provider: Margaretann Loveless, PA-C   Chief Complaint  Patient presents with   Hip Pain   Subjective    Hip Pain  The incident occurred more than 1 week ago. There was no injury mechanism. The pain is present in the right hip. The quality of the pain is described as aching (ache- throbbing). The pain is at a severity of 6/10. The pain is moderate. The pain has been constant since onset. Pertinent negatives include no inability to bear weight, numbness or tingling. Associated symptoms comments: Burning on the top of her right thigh. She reports no foreign bodies present. The symptoms are aggravated by movement. She has tried heat and NSAIDs (IBU) for the symptoms.  The burning on top of right thigh started on Thursday.  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   atorvastatin (LIPITOR) 20 MG tablet Take 1 tablet (20 mg total) by mouth daily.  (Patient taking differently: Take 20 mg by mouth at bedtime. )   Cholecalciferol (VITAMIN D) 50 MCG (2000 UT) CAPS Take 2,000 Units by mouth daily.   ibuprofen (ADVIL) 800 MG tablet TAKE 1 TABLET(800 MG) BY MOUTH EVERY 8 HOURS AS NEEDED FOR CRAMPING   Semaglutide, 1 MG/DOSE, (OZEMPIC, 1 MG/DOSE,) 2 MG/1.5ML SOPN Inject 1 mg into the skin once a week.   doxycycline (VIBRAMYCIN) 100 MG capsule Take 1 capsule (100 mg total) by mouth 2 (two) times daily.   metFORMIN (GLUCOPHAGE) 500 MG tablet Take 1 tablet (500 mg total) by mouth 2 (two) times daily with a meal. (Patient not taking: Reported on 01/15/2020)   No facility-administered medications prior to visit.    Review of Systems  Constitutional: Negative.   Respiratory: Negative.   Cardiovascular: Negative.   Musculoskeletal: Positive for arthralgias, gait problem and myalgias.  Neurological: Negative for tingling and numbness.    Last CBC Lab Results  Component Value Date   WBC 10.7 11/15/2019   HGB 13.4 11/15/2019   HCT 40.7 11/15/2019   MCV 91 11/15/2019   MCH 30.0 11/15/2019   RDW 13.1 11/15/2019   PLT 287 11/15/2019   Last metabolic panel Lab Results  Component Value Date   GLUCOSE 176 (H) 08/26/2019   NA 136 08/26/2019   K 4.1 08/26/2019   CL 101 08/26/2019   CO2 23 08/26/2019   BUN 6 08/26/2019   CREATININE 0.68 08/26/2019   GFRNONAA 109 08/26/2019   GFRAA 126 08/26/2019  CALCIUM 9.1 08/26/2019   PROT 6.6 08/26/2019   ALBUMIN 4.3 08/26/2019   LABGLOB 2.3 08/26/2019   AGRATIO 1.9 08/26/2019   BILITOT <0.2 08/26/2019   ALKPHOS 70 08/26/2019   AST 12 08/26/2019   ALT 16 08/26/2019   ANIONGAP 12 06/16/2017      Objective    BP 110/77 (BP Location: Left Arm, Patient Position: Sitting, Cuff Size: Large)    Pulse 92    Temp (!) 97.2 F (36.2 C) (Oral)    Resp 16    Wt 162 lb 9.6 oz (73.8 kg)    BMI 28.80 kg/m  BP Readings from Last 3 Encounters:  01/15/20 110/77  11/15/19 (!) 132/76  11/01/19 124/70    Wt Readings from Last 3 Encounters:  01/15/20 162 lb 9.6 oz (73.8 kg)  11/15/19 161 lb (73 kg)  11/01/19 165 lb 6.4 oz (75 kg)      Physical Exam Vitals reviewed.  Constitutional:      General: She is not in acute distress.    Appearance: Normal appearance. She is well-developed. She is not ill-appearing or diaphoretic.  Cardiovascular:     Rate and Rhythm: Normal rate and regular rhythm.     Heart sounds: Normal heart sounds. No murmur heard.  No friction rub. No gallop.   Pulmonary:     Effort: Pulmonary effort is normal. No respiratory distress.     Breath sounds: Normal breath sounds. No wheezing or rales.  Musculoskeletal:     Cervical back: Normal range of motion and neck supple.     Right hip: Tenderness present. No deformity, bony tenderness or crepitus. Decreased range of motion (IR and ER). Normal strength.       Legs:  Neurological:     Mental Status: She is alert.       No results found for any visits on 01/15/20.  Assessment & Plan     1. Herpes zoster without complication Since she is having burning pain in the distribution of the red rash over the thigh, will treat empirically for shingles with Valtrex as below. Call if worsening.  - valACYclovir (VALTREX) 1000 MG tablet; Take 1 tablet (1,000 mg total) by mouth 3 (three) times daily.  Dispense: 21 tablet; Refill: 0  2. Right hip pain Will get imaging as below. May use moist heat to area. Continue IBU 600-800mg  TID.  - DG Hip Unilat W OR W/O Pelvis 2-3 Views Right; Future   No follow-ups on file.      Delmer Islam, PA-C, have reviewed all documentation for this visit. The documentation on 01/19/20 for the exam, diagnosis, procedures, and orders are all accurate and complete.   Reine Just  [facility] [phone] (phone) [phone] (fax)  [facility]

## 2020-01-15 ENCOUNTER — Ambulatory Visit
Admission: RE | Admit: 2020-01-15 | Discharge: 2020-01-15 | Disposition: A | Discharge status: Home / Self Care | Payer: BC Managed Care – PPO | Source: Ambulatory Visit | Attending: Physician Assistant | Admitting: Physician Assistant

## 2020-01-15 ENCOUNTER — Other Ambulatory Visit: Payer: Self-pay

## 2020-01-15 ENCOUNTER — Ambulatory Visit
Admission: RE | Admit: 2020-01-15 | Discharge: 2020-01-15 | Disposition: A | Discharge status: Home / Self Care | Payer: BC Managed Care – PPO | Attending: Physician Assistant | Admitting: Physician Assistant

## 2020-01-15 ENCOUNTER — Ambulatory Visit: Payer: BC Managed Care – PPO | Admitting: Physician Assistant

## 2020-01-15 ENCOUNTER — Encounter: Payer: Self-pay | Admitting: Physician Assistant

## 2020-01-15 ENCOUNTER — Telehealth: Payer: Self-pay

## 2020-01-15 VITALS — BP 110/77 | HR 92 | Temp 97.2°F | Resp 16 | Wt 162.6 lb

## 2020-01-15 DIAGNOSIS — M25551 Pain in right hip: Secondary | ICD-10-CM

## 2020-01-15 DIAGNOSIS — M47816 Spondylosis without myelopathy or radiculopathy, lumbar region: Secondary | ICD-10-CM | POA: Diagnosis not present

## 2020-01-15 DIAGNOSIS — B029 Zoster without complications: Secondary | ICD-10-CM

## 2020-01-15 DIAGNOSIS — M1611 Unilateral primary osteoarthritis, right hip: Secondary | ICD-10-CM | POA: Diagnosis not present

## 2020-01-15 MED ORDER — METFORMIN HCL 500 MG PO TABS: 500.0000 mg | ORAL_TABLET | Freq: Two times a day (BID) | ORAL | 1 refills | Status: DC

## 2020-01-15 MED ORDER — VALACYCLOVIR HCL 1 G PO TABS: 1000.0000 mg | ORAL_TABLET | Freq: Three times a day (TID) | ORAL | 0 refills | Status: DC

## 2020-01-15 MED ORDER — OZEMPIC (1 MG/DOSE) 2 MG/1.5ML ~~LOC~~ SOPN: 1.0000 mg | PEN_INJECTOR | SUBCUTANEOUS | 5 refills | Status: DC

## 2020-01-15 NOTE — Patient Instructions (Signed)
Hip Pain The hip is the joint between the upper legs and the lower pelvis. The bones, cartilage, tendons, and muscles of your hip joint support your body and allow you to move around. Hip pain can range from a minor ache to severe pain in one or both of your hips. The pain may be felt on the inside of the hip joint near the groin, or on the outside near the buttocks and upper thigh. You may also have swelling or stiffness in your hip area. Follow these instructions at home: Managing pain, stiffness, and swelling      If directed, put ice on the painful area. To do this: ? Put ice in a plastic bag. ? Place a towel between your skin and the bag. ? Leave the ice on for 20 minutes, 2-3 times a day.  If directed, apply heat to the affected area as often as told by your health care provider. Use the heat source that your health care provider recommends, such as a moist heat pack or a heating pad. ? Place a towel between your skin and the heat source. ? Leave the heat on for 20-30 minutes. ? Remove the heat if your skin turns bright red. This is especially important if you are unable to feel pain, heat, or cold. You may have a greater risk of getting burned. Activity  Do exercises as told by your health care provider.  Avoid activities that cause pain. General instructions   Take over-the-counter and prescription medicines only as told by your health care provider.  Keep a journal of your symptoms. Write down: ? How often you have hip pain. ? The location of your pain. ? What the pain feels like. ? What makes the pain worse.  Sleep with a pillow between your legs on your most comfortable side.  Keep all follow-up visits as told by your health care provider. This is important. Contact a health care provider if:  You cannot put weight on your leg.  Your pain or swelling continues or gets worse after one week.  It gets harder to walk.  You have a fever. Get help right away  if:  You fall.  You have a sudden increase in pain and swelling in your hip.  Your hip is red or swollen or very tender to touch. Summary  Hip pain can range from a minor ache to severe pain in one or both of your hips.  The pain may be felt on the inside of the hip joint near the groin, or on the outside near the buttocks and upper thigh.  Avoid activities that cause pain.  Write down how often you have hip pain, the location of the pain, what makes it worse, and what it feels like. This information is not intended to replace advice given to you by your health care provider. Make sure you discuss any questions you have with your health care provider. Document Revised: 08/20/2018 Document Reviewed: 08/20/2018 Elsevier Patient Education  2020 [employer] -- 

## 2020-01-15 NOTE — Telephone Encounter (Signed)
-----   Message from Margaretann Loveless, [place] sent at 01/14/2020  4:08 PM EDT ----- A1c has increased from 8.4 to 10.4. Are you taking Ozempic weekly and Metformin 500mg  twice daily? If so, we could increase Ozempic if you are tolerating this well.

## 2020-01-15 NOTE — Telephone Encounter (Signed)
A1c has increased from 8.4 to 10.4. Are you taking Ozempic weekly and Metformin 500mg  twice daily? If so, we could increase Ozempic if you are tolerating this well.  Written by , PA-C on 01/14/2020 4:08 PM EDT Seen by patient 01/16/2020 on 01/14/2020 4:14 PM

## 2020-01-16 ENCOUNTER — Encounter: Payer: Self-pay | Admitting: Physician Assistant

## 2020-01-17 ENCOUNTER — Encounter: Payer: Self-pay | Admitting: Physician Assistant

## 2020-01-17 DIAGNOSIS — M25551 Pain in right hip: Secondary | ICD-10-CM

## 2020-01-17 MED ORDER — MELOXICAM 15 MG PO TABS: 15.0000 mg | ORAL_TABLET | Freq: Every day | ORAL | 0 refills | Status: DC

## 2020-01-17 NOTE — Addendum Note (Signed)
Addended by: Margaretann Loveless on: 01/17/2020 03:58 PM   Modules accepted: Orders

## 2020-01-20 ENCOUNTER — Encounter: Payer: Self-pay | Admitting: Physician Assistant

## 2020-01-20 DIAGNOSIS — L03115 Cellulitis of right lower limb: Secondary | ICD-10-CM

## 2020-01-20 DIAGNOSIS — M25551 Pain in right hip: Secondary | ICD-10-CM

## 2020-01-20 MED ORDER — CEPHALEXIN 500 MG PO CAPS: 500.0000 mg | ORAL_CAPSULE | Freq: Two times a day (BID) | ORAL | 0 refills | Status: DC

## 2020-01-20 NOTE — Addendum Note (Signed)
Addended by: Margaretann Loveless on: 01/20/2020 02:47 PM   Modules accepted: Orders

## 2020-01-28 ENCOUNTER — Other Ambulatory Visit: Payer: Self-pay | Admitting: Physician Assistant

## 2020-01-28 DIAGNOSIS — E1165 Type 2 diabetes mellitus with hyperglycemia: Secondary | ICD-10-CM

## 2020-01-31 NOTE — Addendum Note (Signed)
Addended by: Margaretann Loveless on: 01/31/2020 12:35 PM   Modules accepted: Orders

## 2020-02-07 DIAGNOSIS — S76011A Strain of muscle, fascia and tendon of right hip, initial encounter: Secondary | ICD-10-CM | POA: Diagnosis not present

## 2020-03-17 DIAGNOSIS — S76011A Strain of muscle, fascia and tendon of right hip, initial encounter: Secondary | ICD-10-CM | POA: Diagnosis not present

## 2020-04-27 DIAGNOSIS — S76011D Strain of muscle, fascia and tendon of right hip, subsequent encounter: Secondary | ICD-10-CM | POA: Diagnosis not present

## 2020-05-14 DIAGNOSIS — E119 Type 2 diabetes mellitus without complications: Secondary | ICD-10-CM | POA: Diagnosis not present

## 2020-05-14 DIAGNOSIS — M25551 Pain in right hip: Secondary | ICD-10-CM | POA: Diagnosis not present

## 2020-05-14 DIAGNOSIS — S76011D Strain of muscle, fascia and tendon of right hip, subsequent encounter: Secondary | ICD-10-CM | POA: Diagnosis not present

## 2020-05-18 DIAGNOSIS — M25551 Pain in right hip: Secondary | ICD-10-CM | POA: Diagnosis not present

## 2020-05-20 DIAGNOSIS — M791 Myalgia, unspecified site: Secondary | ICD-10-CM | POA: Diagnosis not present

## 2020-05-20 DIAGNOSIS — M25551 Pain in right hip: Secondary | ICD-10-CM | POA: Diagnosis not present

## 2020-06-29 DIAGNOSIS — S76011A Strain of muscle, fascia and tendon of right hip, initial encounter: Secondary | ICD-10-CM | POA: Diagnosis not present

## 2020-07-29 DIAGNOSIS — M25551 Pain in right hip: Secondary | ICD-10-CM | POA: Diagnosis not present

## 2020-08-13 DIAGNOSIS — M25551 Pain in right hip: Secondary | ICD-10-CM | POA: Diagnosis not present

## 2020-08-25 ENCOUNTER — Other Ambulatory Visit: Payer: Self-pay

## 2020-08-25 ENCOUNTER — Encounter: Payer: Self-pay | Admitting: Obstetrics and Gynecology

## 2020-08-25 ENCOUNTER — Ambulatory Visit (INDEPENDENT_AMBULATORY_CARE_PROVIDER_SITE_OTHER): Payer: BC Managed Care – PPO | Admitting: Obstetrics and Gynecology

## 2020-08-25 VITALS — BP 124/70 | Ht 63.0 in | Wt 158.0 lb

## 2020-08-25 DIAGNOSIS — N760 Acute vaginitis: Secondary | ICD-10-CM

## 2020-08-25 DIAGNOSIS — B9689 Other specified bacterial agents as the cause of diseases classified elsewhere: Secondary | ICD-10-CM | POA: Diagnosis not present

## 2020-08-25 LAB — POCT WET PREP WITH KOH
Clue Cells Wet Prep HPF POC: POSITIVE
KOH Prep POC: POSITIVE — AB
Trichomonas, UA: NEGATIVE
Yeast Wet Prep HPF POC: NEGATIVE

## 2020-08-25 MED ORDER — METRONIDAZOLE 500 MG PO TABS: 500.0000 mg | ORAL_TABLET | Freq: Two times a day (BID) | ORAL | 0 refills | Status: AC

## 2020-08-25 NOTE — Patient Instructions (Signed)
I value your feedback and you entrusting us with your care. If you get a [facility] patient survey, I would appreciate you taking the time to let us know about your experience today. Thank you! ? ? ?

## 2020-08-25 NOTE — Progress Notes (Signed)
Bethany Loveless, PA-C   Chief Complaint  Patient presents with  . Vaginal Discharge    Itching, irritation, abnormal odor x 1 week    HPI:      Bethany Edwards is a 40 y.o. [id number] whose LMP was No LMP recorded. Patient has had an ablation., presents today for increased vag d/c with irritation and odor for the past 7-10 days. Treated with diflucan daily for 5 days without sx relief. Has always had a lot of vag d/c and wears pantyliners daily. Itching is more towards mons and not the vaginal opening. No urin sx, no LBP, pelvic pain, fevers. No recent abx use, no new soaps/detergents. Doesn't use dryer sheets. Hx of poorly controlled DM.   Past Medical History:  Diagnosis Date  . Depression   . Diabetes mellitus without complication (HCC)    Type II  . Hyperlipidemia     Past Surgical History:  Procedure Laterality Date  . HYSTEROSCOPY WITH D & C N/A 10/22/2019   Procedure: DILATATION AND CURETTAGE /HYSTEROSCOPY W ABLATION;  Surgeon: Natale Milch, MD;  Location: [facility] ORS;  Service: Gynecology;  Laterality: N/A;  . HYSTEROSCOPY WITH NOVASURE N/A 10/22/2019   Procedure: HYSTEROSCOPY WITH NOVASURE;  Surgeon: Natale Milch, MD;  Location: [facility] OR[facility];  Service: Gynecology;  Laterality: N/A;  . INCISION AND DRAINAGE ABSCESS Left 2003 and 2007   Axillary Abscess  . TUBAL LIGATION  2003    Family History  Adopted: Yes  Family history unknown: Yes    Social History   Socioeconomic History  . Marital status: Married    Spouse name: Casimiro Needle  . Number of children: 2  . Years of education: Not on file  . Highest education level: Not on file  Occupational History  . Not on file  Tobacco Use  . Smoking status: Current Every Day Smoker    Packs/day: 0.50    Years: 25.00    Pack years: 12.50    Types: Cigarettes  . Smokeless tobacco: Never Used  Vaping Use  . Vaping Use: Never used  Substance and Sexual Activity  . Alcohol use: No  . Drug use: No  .  Sexual activity: Yes    Comment: Endometrial ablation  Other Topics Concern  . Not on file  Social History Narrative   Right handed   Lives in a single story with husband, 2 kids and step daughter, dog and reptiles.   Social Determinants of Health   Financial Resource Strain: Not on file  Food Insecurity: Not on file  Transportation Needs: Not on file  Physical Activity: Not on file  Stress: Not on file  Social Connections: Not on file  Intimate Partner Violence: Not on file    Outpatient Medications Prior to Visit  Medication Sig Dispense Refill  . acetaminophen (TYLENOL) 500 MG tablet Take 2 tablets (1,000 mg total) by mouth every 6 (six) hours as needed. 100 tablet 0  . atorvastatin (LIPITOR) 20 MG tablet Take 1 tablet (20 mg total) by mouth daily. (Patient taking differently: Take 20 mg by mouth at bedtime.) 90 tablet 3  . Cholecalciferol (VITAMIN D) 50 MCG (2000 UT) CAPS Take 2,000 Units by mouth daily.    Marland Kitchen ibuprofen (ADVIL) 800 MG tablet TAKE 1 TABLET(800 MG) BY MOUTH EVERY 8 HOURS AS NEEDED FOR CRAMPING 30 tablet 1  . metFORMIN (GLUCOPHAGE) 500 MG tablet Take 1 tablet (500 mg total) by mouth 2 (two) times daily with a meal. 180  tablet 1  . OZEMPIC, 1 MG/DOSE, 4 MG/3ML SOPN Inject 1 mg into the skin once a week.    . cephALEXin (KEFLEX) 500 MG capsule Take 1 capsule (500 mg total) by mouth 2 (two) times daily. 14 capsule 0  . meloxicam (MOBIC) 15 MG tablet Take 1 tablet (15 mg total) by mouth daily. 30 tablet 0  . Semaglutide, 1 MG/DOSE, (OZEMPIC, 1 MG/DOSE,) 2 MG/1.5ML SOPN Inject 1 mg into the skin once a week. 3 mL 5  . valACYclovir (VALTREX) 1000 MG tablet Take 1 tablet (1,000 mg total) by mouth 3 (three) times daily. 21 tablet 0   No facility-administered medications prior to visit.      ROS:  Review of Systems  Constitutional: Negative for fever.  Gastrointestinal: Negative for blood in stool, constipation, diarrhea, nausea and vomiting.  Genitourinary: Positive  for vaginal discharge. Negative for dyspareunia, dysuria, flank pain, frequency, hematuria, urgency, vaginal bleeding and vaginal pain.  Musculoskeletal: Negative for back pain.  Skin: Negative for rash.   BREAST: No symptoms   OBJECTIVE:   Vitals:  BP 124/70   Ht 5\' 3"  (1.6 m)   Wt 158 lb (71.7 kg)   BMI 27.99 kg/m   Physical Exam Vitals reviewed.  Constitutional:      Appearance: She is well-developed.  Pulmonary:     Effort: Pulmonary effort is normal.  Genitourinary:    General: Normal vulva.     Pubic Area: No rash.      Labia:        Right: No rash, tenderness or lesion.        Left: No rash, tenderness or lesion.      Vagina: Vaginal discharge present. No erythema or tenderness.     Cervix: Normal.     Uterus: Normal. Not enlarged and not tender.      Adnexa: Right adnexa normal and left adnexa normal.       Right: No mass or tenderness.         Left: No mass or tenderness.      Musculoskeletal:        General: Normal range of motion.     Cervical back: Normal range of motion.  Skin:    General: Skin is warm and dry.  Neurological:     General: No focal deficit present.     Mental Status: She is alert and oriented to person, place, and time.  Psychiatric:        Mood and Affect: Mood normal.        Behavior: Behavior normal.        Thought Content: Thought content normal.        Judgment: Judgment normal.     Results: Results for orders placed or performed in visit on 08/25/20 (from the past 24 hour(s))  POCT Wet Prep with KOH     Status: Abnormal   Collection Time: 08/25/20  3:09 PM  Result Value Ref Range   Trichomonas, UA Negative    Clue Cells Wet Prep HPF POC pos    Epithelial Wet Prep HPF POC     Yeast Wet Prep HPF POC neg    Bacteria Wet Prep HPF POC     RBC Wet Prep HPF POC     WBC Wet Prep HPF POC     KOH Prep POC Positive (A) Negative     Assessment/Plan: BV (bacterial vaginosis) - Plan: metroNIDAZOLE (FLAGYL) 500 MG tablet, POCT  Wet Prep with KOH; pos sx and  wet prep. Rx flagyl, no EtOH. Will RF if sx recur. Change pantyliners regularly. OTC hydrocortisone crm ext prn itch. F/u prn.    Meds ordered this encounter  Medications  . metroNIDAZOLE (FLAGYL) 500 MG tablet    Sig: Take 1 tablet (500 mg total) by mouth 2 (two) times daily for 7 days. No alcohol use for 10 days after starting medicine    Dispense:  14 tablet    Refill:  0    Order Specific Question:   Supervising Provider    Answer:   Nadara Mustard [[id number]]      Return if symptoms worsen or fail to improve.  Eliyahu Bille B. Jamarian Jacinto, PA-C 08/25/2020 3:11 PM

## 2020-11-10 ENCOUNTER — Telehealth: Payer: Self-pay | Admitting: Physician Assistant

## 2020-11-10 NOTE — Telephone Encounter (Signed)
[facility] faxed refill request for the following medications:  OZEMPIC, 1 MG/DOSE, 4 MG/3ML SOPN      Please advise.

## 2020-11-11 NOTE — Telephone Encounter (Signed)
Has been over a year since patient has last followed up, will need office visit prior to refill.  Can we contact patient and have her scheduled with PA or NP? KW

## 2020-11-11 NOTE — Telephone Encounter (Signed)
Appt for 11/16/20 at 9:00 a.m. w/ Dr. Leonard Schwartz

## 2020-11-16 ENCOUNTER — Ambulatory Visit: Payer: BC Managed Care – PPO | Admitting: Family Medicine

## 2020-11-17 ENCOUNTER — Other Ambulatory Visit: Payer: Self-pay

## 2020-11-17 ENCOUNTER — Encounter: Payer: Self-pay | Admitting: Family Medicine

## 2020-11-17 ENCOUNTER — Ambulatory Visit: Payer: BC Managed Care – PPO | Admitting: Family Medicine

## 2020-11-17 VITALS — BP 125/85 | HR 94 | Temp 99.0°F | Resp 16 | Ht 63.0 in | Wt 159.0 lb

## 2020-11-17 DIAGNOSIS — E785 Hyperlipidemia, unspecified: Secondary | ICD-10-CM

## 2020-11-17 DIAGNOSIS — F17200 Nicotine dependence, unspecified, uncomplicated: Secondary | ICD-10-CM

## 2020-11-17 DIAGNOSIS — E1169 Type 2 diabetes mellitus with other specified complication: Secondary | ICD-10-CM | POA: Diagnosis not present

## 2020-11-17 DIAGNOSIS — E1165 Type 2 diabetes mellitus with hyperglycemia: Secondary | ICD-10-CM | POA: Diagnosis not present

## 2020-11-17 DIAGNOSIS — E663 Overweight: Secondary | ICD-10-CM

## 2020-11-17 DIAGNOSIS — Z1231 Encounter for screening mammogram for malignant neoplasm of breast: Secondary | ICD-10-CM

## 2020-11-17 MED ORDER — OZEMPIC (1 MG/DOSE) 4 MG/3ML ~~LOC~~ SOPN: 1.0000 mg | PEN_INJECTOR | SUBCUTANEOUS | 5 refills | Status: DC

## 2020-11-17 MED ORDER — ATORVASTATIN CALCIUM 20 MG PO TABS: 20.0000 mg | ORAL_TABLET | Freq: Every day | ORAL | 3 refills | Status: DC

## 2020-11-17 MED ORDER — ONDANSETRON HCL 4 MG PO TABS: 4.0000 mg | ORAL_TABLET | Freq: Three times a day (TID) | ORAL | 0 refills | Status: DC | PRN

## 2020-11-17 MED ORDER — METFORMIN HCL 500 MG PO TABS: 500.0000 mg | ORAL_TABLET | Freq: Two times a day (BID) | ORAL | 1 refills | Status: DC

## 2020-11-17 NOTE — Progress Notes (Signed)
Established patient visit   Patient: Bethany Edwards   DOB: 14-Feb-1981   40 y.o. Female  MRN: [id number] Visit Date: 11/17/2020  Today's healthcare provider: Shirlee Latch, MD   Chief Complaint  Patient presents with   Diabetes   Hyperlipidemia    Subjective    Diabetes Pertinent negatives for hypoglycemia include no dizziness or headaches. Pertinent negatives for diabetes include no chest pain, no fatigue, no polydipsia, no polyphagia, no polyuria and no weakness.  Hyperlipidemia Pertinent negatives include no chest pain, myalgias or shortness of breath.    Diabetes Mellitus Type II, Follow-up  Lab Results  Component Value Date   HGBA1C 10.4 (H) 01/13/2020   HGBA1C 8.4 (H) 08/26/2019   HGBA1C 8.7 (H) 12/19/2018   Wt Readings from Last 3 Encounters:  11/17/20 159 lb (72.1 kg)  08/25/20 158 lb (71.7 kg)  01/15/20 162 lb 9.6 oz (73.8 kg)   Last seen for diabetes 11 months ago.  Management since then includes starting on 1 mg Ozepemic.   She started with Trulicity but she was having severe nausea. She hasn't taken her ozempic for about 2 months because she hasn't been able to commit to a follow-up due to the demand of her job.   She smokes 1-2 cigarattes a day versus a pack a day. She has increased her exercise and continues to watch her diet.   She reports fair compliance with treatment. She is not having side effects.   Symptoms: No fatigue No foot ulcerations  No appetite changes No nausea  No paresthesia of the feet  No polydipsia  No polyuria No visual disturbances   No vomiting     Home blood sugar records: fasting range: 180s-240s  Episodes of hypoglycemia? No    Current insulin regiment: none Most Recent Eye Exam:  Current exercise: walking Current diet habits: well balanced  Pertinent Labs: Lab Results  Component Value Date   CHOL 223 (H) 08/26/2019   HDL 27 (L) 08/26/2019   LDLCALC 154 (H) 08/26/2019   TRIG 226 (H) 08/26/2019    CHOLHDL 9.4 (H) 12/19/2018   Lab Results  Component Value Date   NA 136 08/26/2019   K 4.1 08/26/2019   CREATININE 0.68 08/26/2019   GFRNONAA 109 08/26/2019   GFRAA 126 08/26/2019   GLUCOSE 176 (H) 08/26/2019     --------------------------------------------------------------------------------------------------- Lipid/Cholesterol, Follow-up  Last lipid panel Other pertinent labs  Lab Results  Component Value Date   CHOL 223 (H) 08/26/2019   HDL 27 (L) 08/26/2019   LDLCALC 154 (H) 08/26/2019   TRIG 226 (H) 08/26/2019   CHOLHDL 9.4 (H) 12/19/2018   Lab Results  Component Value Date   ALT 16 08/26/2019   AST 12 08/26/2019   PLT 287 11/15/2019   TSH 1.600 08/26/2019     She was last seen for this 11 months ago.  Management since that visit includes starting on atorvastatin 20mg .   She reports taking atorvastatin and Vitamin D 3-4x a week. She typically forgets because of the fluctuation in her work schedule.   She reports good compliance with treatment. She is not having side effects.   Symptoms: No chest pain No chest pressure/discomfort  No dyspnea No lower extremity edema  No numbness or tingling of extremity No orthopnea  No palpitations No paroxysmal nocturnal dyspnea  No speech difficulty No syncope   Current diet: well balanced Current exercise: walking  The 10-year ASCVD risk score DC Jr., et al., 2013)  is: 18.8%  Mammogram  She is due for her mammogram and she is requesting for a referral.      Medications: Outpatient Medications Prior to Visit  Medication Sig   Cholecalciferol (VITAMIN D) 50 MCG (2000 UT) CAPS Take 2,000 Units by mouth daily.   ibuprofen (ADVIL) 800 MG tablet TAKE 1 TABLET(800 MG) BY MOUTH EVERY 8 HOURS AS NEEDED FOR CRAMPING   [DISCONTINUED] atorvastatin (LIPITOR) 20 MG tablet Take 1 tablet (20 mg total) by mouth daily. (Patient taking differently: Take 20 mg by mouth at bedtime.)   [DISCONTINUED] metFORMIN (GLUCOPHAGE) 500 MG  tablet Take 1 tablet (500 mg total) by mouth 2 (two) times daily with a meal.   [DISCONTINUED] OZEMPIC, 1 MG/DOSE, 4 MG/3ML SOPN Inject 1 mg into the skin once a week.   No facility-administered medications prior to visit.    Review of Systems  Constitutional:  Negative for activity change, chills, fatigue and fever.  HENT:  Negative for ear pain, sinus pressure, sinus pain and sore throat.   Eyes:  Negative for pain and visual disturbance.  Respiratory:  Negative for cough, chest tightness, shortness of breath and wheezing.   Cardiovascular:  Negative for chest pain, palpitations and leg swelling.  Gastrointestinal:  Negative for abdominal pain, blood in stool, diarrhea, nausea and vomiting.  Endocrine: Negative for cold intolerance, heat intolerance, polydipsia, polyphagia and polyuria.  Genitourinary:  Negative for flank pain, frequency, pelvic pain and urgency.  Musculoskeletal:  Negative for arthralgias, back pain, joint swelling, myalgias and neck pain.  Neurological:  Negative for dizziness, weakness, light-headedness, numbness and headaches.      Objective    BP 125/85   Pulse 94   Temp 99 F (37.2 C)   Resp 16   Ht 5\' 3"  (1.6 m)   Wt 159 lb (72.1 kg)   BMI 28.17 kg/m     Physical Exam Vitals reviewed.  Constitutional:      General: She is not in acute distress.    Appearance: Normal appearance. She is well-developed. She is not diaphoretic.  HENT:     Head: Normocephalic and atraumatic.  Eyes:     General: No scleral icterus.    Conjunctiva/sclera: Conjunctivae normal.  Neck:     Thyroid: No thyromegaly.  Cardiovascular:     Rate and Rhythm: Normal rate and regular rhythm.     Pulses: Normal pulses.     Heart sounds: Normal heart sounds. No murmur heard. Pulmonary:     Effort: Pulmonary effort is normal. No respiratory distress.     Breath sounds: Normal breath sounds. No wheezing, rhonchi or rales.  Musculoskeletal:     Cervical back: Neck supple.      Right lower leg: No edema.     Left lower leg: No edema.  Feet:     Comments: Diabetic Foot Exam - Simple   Simple Foot Form Diabetic Foot exam was performed with the following findings: Yes 11/17/2020   9:56 AM  Visual Inspection No deformities, no ulcerations, no other skin breakdown bilaterally: Yes Sensation Testing Intact to touch and monofilament testing bilaterally: Yes Pulse Check Posterior Tibialis and Dorsalis pulse intact bilaterally: Yes Comments       Lymphadenopathy:     Cervical: No cervical adenopathy.  Skin:    General: Skin is warm and dry.     Findings: No rash.  Neurological:     Mental Status: She is alert and oriented to person, place, and time. Mental status is at baseline.  Psychiatric:        Mood and Affect: Mood normal.        Behavior: Behavior normal.     No results found for any visits on 11/17/20.  Assessment & Plan     Problem List Items Addressed This Visit       Endocrine   Type 2 diabetes mellitus with hyperglycemia, without long-term current use of insulin (HCC) - Primary    Previously uncontrolled with hyperglycemia Continue current medications - resume UTD on vaccines foot exam Edwards today On Statin Discussed diet and exercise Recheck A1c F/u in 3 months        Relevant Medications   OZEMPIC, 1 MG/DOSE, 4 MG/3ML SOPN   metFORMIN (GLUCOPHAGE) 500 MG tablet   atorvastatin (LIPITOR) 20 MG tablet   Other Relevant Orders   Hemoglobin A1c   Hyperlipidemia associated with type 2 diabetes mellitus (HCC)    Previously well controlled Continue statin Repeat FLP and CMP Goal LDL < 70       Relevant Medications   OZEMPIC, 1 MG/DOSE, 4 MG/3ML SOPN   metFORMIN (GLUCOPHAGE) 500 MG tablet   atorvastatin (LIPITOR) 20 MG tablet   Other Relevant Orders   Lipid panel   Comprehensive metabolic panel     Other   Compulsive tobacco user syndrome    Has cut back on smoking Not interested in meds at this time       Overweight     Discussed importance of healthy weight management Discussed diet and exercise        Relevant Orders   Lipid panel   Comprehensive metabolic panel   Other Visit Diagnoses     Encounter for screening mammogram for malignant neoplasm of breast       Relevant Orders   MM 3D SCREEN BREAST BILATERAL      Return in about 3 months (around 02/17/2021) for chronic disease f/u, With new PCP.      I,Essence Turner,acting as a Neurosurgeon for Shirlee Latch, MD.,have documented all relevant documentation on the behalf of Shirlee Latch, MD,as directed by  Shirlee Latch, MD while in the presence of Shirlee Latch, MD.   I, Shirlee Latch, MD, have reviewed all documentation for this visit. The documentation on 11/17/20 for the exam, diagnosis, procedures, and orders are all accurate and complete.   Shaydon Lease, Marzella Schlein, MD, MPH [facility]

## 2020-11-17 NOTE — Assessment & Plan Note (Signed)
Has cut back on smoking Not interested in meds at this time

## 2020-11-17 NOTE — Assessment & Plan Note (Signed)
Discussed importance of healthy weight management Discussed diet and exercise  

## 2020-11-17 NOTE — Assessment & Plan Note (Signed)
Previously well controlled Continue statin Repeat FLP and CMP Goal LDL < 70 

## 2020-11-17 NOTE — Assessment & Plan Note (Signed)
Previously uncontrolled with hyperglycemia Continue current medications - resume UTD on vaccines foot exam done today On Statin Discussed diet and exercise Recheck A1c F/u in 3 months

## 2020-11-18 LAB — COMPREHENSIVE METABOLIC PANEL
ALT: 25 IU/L (ref 0–32)
AST: 17 IU/L (ref 0–40)
Albumin/Globulin Ratio: 1.9 (ref 1.2–2.2)
Albumin: 4.4 g/dL (ref 3.8–4.8)
Alkaline Phosphatase: 72 IU/L (ref 44–121)
BUN/Creatinine Ratio: 13 (ref 9–23)
BUN: 8 mg/dL (ref 6–24)
Bilirubin Total: 0.3 mg/dL (ref 0.0–1.2)
CO2: 23 mmol/L (ref 20–29)
Calcium: 9.2 mg/dL (ref 8.7–10.2)
Chloride: 101 mmol/L (ref 96–106)
Creatinine, Ser: 0.6 mg/dL (ref 0.57–1.00)
Globulin, Total: 2.3 g/dL (ref 1.5–4.5)
Glucose: 221 mg/dL — ABNORMAL HIGH (ref 65–99)
Potassium: 4.3 mmol/L (ref 3.5–5.2)
Sodium: 137 mmol/L (ref 134–144)
Total Protein: 6.7 g/dL (ref 6.0–8.5)
eGFR: 115 mL/min/{1.73_m2} (ref >59)

## 2020-11-18 LAB — HEMOGLOBIN A1C
Est. average glucose Bld gHb Est-mCnc: 240 mg/dL
Hgb A1c MFr Bld: 10 % — ABNORMAL HIGH (ref 4.8–5.6)

## 2020-11-18 LAB — LIPID PANEL
Chol/HDL Ratio: 7 ratio — ABNORMAL HIGH (ref 0.0–4.4)
Cholesterol, Total: 218 mg/dL — ABNORMAL HIGH (ref 100–199)
HDL: 31 mg/dL — ABNORMAL LOW (ref >39)
LDL Chol Calc (NIH): 146 mg/dL — ABNORMAL HIGH (ref 0–99)
Triglycerides: 222 mg/dL — ABNORMAL HIGH (ref 0–149)
VLDL Cholesterol Cal: 41 mg/dL — ABNORMAL HIGH (ref 5–40)

## 2020-12-08 ENCOUNTER — Encounter: Payer: Self-pay | Admitting: Family Medicine

## 2020-12-08 MED ORDER — ONDANSETRON HCL 4 MG PO TABS: 4.0000 mg | ORAL_TABLET | Freq: Three times a day (TID) | ORAL | 0 refills | Status: DC | PRN

## 2020-12-08 NOTE — Telephone Encounter (Signed)
Please send in Zofran refill and let her know it was sent

## 2020-12-10 ENCOUNTER — Telehealth: Payer: Self-pay

## 2020-12-10 MED ORDER — OZEMPIC (1 MG/DOSE) 4 MG/3ML ~~LOC~~ SOPN: 1.0000 mg | PEN_INJECTOR | SUBCUTANEOUS | 5 refills | Status: DC

## 2020-12-10 MED ORDER — ATORVASTATIN CALCIUM 20 MG PO TABS: 20.0000 mg | ORAL_TABLET | Freq: Every day | ORAL | 3 refills | Status: DC

## 2020-12-10 NOTE — Telephone Encounter (Signed)
Refills sent to pharmacy. 

## 2020-12-14 ENCOUNTER — Other Ambulatory Visit: Payer: Self-pay

## 2020-12-14 ENCOUNTER — Encounter: Payer: Self-pay | Admitting: Gastroenterology

## 2020-12-14 ENCOUNTER — Ambulatory Visit (INDEPENDENT_AMBULATORY_CARE_PROVIDER_SITE_OTHER): Payer: BC Managed Care – PPO | Admitting: Gastroenterology

## 2020-12-14 VITALS — BP 141/91 | HR 94 | Temp 98.7°F | Ht 63.0 in | Wt 156.1 lb

## 2020-12-14 DIAGNOSIS — K5904 Chronic idiopathic constipation: Secondary | ICD-10-CM

## 2020-12-14 MED ORDER — GOLYTELY 236 G PO SOLR: 4000.0000 mL | Freq: Once | ORAL | 0 refills | Status: AC

## 2020-12-14 NOTE — Progress Notes (Signed)
Bethany Repress, MD [address]  [address]  [place], [place] [address]  Main: [phone]  Fax: [phone]    Gastroenterology Consultation  Referring Provider:     Suella Grove* Primary Care Physician:  Jacky Kindle, FNP Primary Gastroenterologist:  Dr. Arlyss Edwards Reason for Consultation:     Chronic constipation        HPI:   Bethany Edwards is a 40 y.o. female referred by Dr. Suzie Edwards, Bethany Eastern, FNP  for consultation & management of chronic constipation.  Patient has history of metabolic syndrome, hemoglobin S0F 10, hyperlipidemia.  Patient reports that she has been experiencing severe constipation almost all her life, bowel frequency once a week or 2 weeks, associated with incomplete emptying, significant abdominal bloating, stools are hard in consistency.  Patient reports taking MiraLAX daily which does not help.  She tried stool softeners, laxatives, enema with no relief.  With worsening of diabetes and hyperlipidemia, patient reports that she has been trying to adopt healthy lifestyle, lost about 20 pounds within last 2 years and she feels better.  She is drinking more water.  Her labs revealed normal CBC, CMP, TSH.  Patient does smoke cigarettes, she has significantly cut back from 1 pack/day to 5 cigarettes/day She does not drink alcohol Patient is started on Ozempic for diabetes  NSAIDs: None  Antiplts/Anticoagulants/Anti thrombotics: None  GI Procedures: None She is adopted  Past Medical History:  Diagnosis Date   Depression    Diabetes mellitus without complication (HCC)    Type II   Hyperlipidemia     Past Surgical History:  Procedure Laterality Date   HYSTEROSCOPY WITH D & C N/A 10/22/2019   Procedure: DILATATION AND CURETTAGE /HYSTEROSCOPY W ABLATION;  Surgeon: Natale Milch, MD;  Location: [facility] ORS;  Service: Gynecology;  Laterality: N/A;   HYSTEROSCOPY WITH NOVASURE N/A 10/22/2019   Procedure: HYSTEROSCOPY WITH NOVASURE;  Surgeon:  Natale Milch, MD;  Location: [facility] ORS;  Service: Gynecology;  Laterality: N/A;   INCISION AND DRAINAGE ABSCESS Left 2003 and 2007   Axillary Abscess   TUBAL LIGATION  2003    Current Outpatient Medications:    atorvastatin (LIPITOR) 20 MG tablet, Take 1 tablet (20 mg total) by mouth daily., Disp: 90 tablet, Rfl: 3   Cholecalciferol (VITAMIN D) 50 MCG (2000 UT) CAPS, Take 2,000 Units by mouth daily., Disp: , Rfl:    ibuprofen (ADVIL) 800 MG tablet, TAKE 1 TABLET(800 MG) BY MOUTH EVERY 8 HOURS AS NEEDED FOR CRAMPING, Disp: 30 tablet, Rfl: 1   metFORMIN (GLUCOPHAGE) 500 MG tablet, Take 1 tablet (500 mg total) by mouth 2 (two) times daily with a meal., Disp: 180 tablet, Rfl: 1   ondansetron (ZOFRAN) 4 MG tablet, Take 1 tablet (4 mg total) by mouth every 8 (eight) hours as needed for nausea or vomiting., Disp: 20 tablet, Rfl: 0   OZEMPIC, 1 MG/DOSE, 4 MG/3ML SOPN, Inject 1 mg into the skin once a week., Disp: 3 mL, Rfl: 5   polyethylene glycol (GOLYTELY) 236 g solution, Take 4,000 mLs by mouth once for 1 dose., Disp: 4000 mL, Rfl: 0  Family History  Adopted: Yes  Family history unknown: Yes     Social History   Tobacco Use   Smoking status: Every Day    Packs/day: 0.50    Years: 25.00    Pack years: 12.50    Types: Cigarettes   Smokeless tobacco: Never  Vaping Use  Vaping Use: Never used  Substance Use Topics   Alcohol use: No   Drug use: No    Allergies as of 12/14/2020 - Review Complete 12/14/2020  Allergen Reaction Noted   Penicillins Rash 09/11/2014    Review of Systems:    All systems reviewed and negative except where noted in HPI.   Physical Exam:  BP (!) 141/91 (BP Location: Left Arm, Patient Position: Sitting, Cuff Size: Normal)   Pulse 94   Temp 98.7 F (37.1 C) (Oral)   Ht 5\' 3"  (1.6 m)   Wt 156 lb 2 oz (70.8 kg)   BMI 27.66 kg/m  No LMP recorded. Patient has had an ablation.  General:   Alert,  Well-developed, well-nourished, pleasant and  cooperative in NAD Head:  Normocephalic and atraumatic. Eyes:  Sclera clear, no icterus.   Conjunctiva pink. Ears:  Normal auditory acuity. Nose:  No deformity, discharge, or lesions. Mouth:  No deformity or lesions,oropharynx pink & moist. Neck:  Supple; no masses or thyromegaly. Lungs:  Respirations even and unlabored.  Clear throughout to auscultation.   No wheezes, crackles, or rhonchi. No acute distress. Heart:  Regular rate and rhythm; no murmurs, clicks, rubs, or gallops. Abdomen:  Normal bowel sounds. Soft, non-tender and non-distended without masses, hepatosplenomegaly or hernias noted.  No guarding or rebound tenderness.   Rectal: Not performed Msk:  Symmetrical without gross deformities. Good, equal movement & strength bilaterally. Pulses:  Normal pulses noted. Extremities:  No clubbing or edema.  No cyanosis. Neurologic:  Alert and oriented x3;  grossly normal neurologically. Skin:  Intact without significant lesions or rashes. No jaundice. Psych:  Alert and cooperative. Normal mood and affect.  Imaging Studies: Reviewed  Assessment and Plan:   [name] is a 40 y.o. female with metabolic syndrome, fatty liver is seen in consultation for chronic severe constipation.  No evidence of anemia, normal TSH  Discussed about high-fiber diet, information provided Encouraged to drink plenty of water Recommend cleanout with GoLytely first Trial of Linzess 290 MCG daily, samples provided Discussed about tight control of diabetes Will discuss about colonoscopy after regulating constipation   Follow up in 6 months and PRN via mychart   45, MD

## 2020-12-14 NOTE — Patient Instructions (Addendum)
Gave Linzess samples please let us know if this worked and we can call you in a prescription. Please take Golytely before starting Linzess Instructions for golytelyFill to the fill line with water or Gatorade and mix together. Drink 8oz every 20 to 30 minutes till gone.   High-Fiber Eating Plan Fiber, also called dietary fiber, is a type of carbohydrate. It is found foods such as fruits, vegetables, whole grains, and beans. A high-fiber diet can have many health benefits. Your health care provider may recommend a high-fiber diet to help: Prevent constipation. Fiber can make your bowel movements more regular. Lower your cholesterol. Relieve the following conditions: Inflammation of veins in the anus (hemorrhoids). Inflammation of specific areas of the digestive tract (uncomplicated diverticulosis). A problem of the large intestine, also called the colon, that sometimes causes pain and diarrhea (irritable bowel syndrome, or IBS). Prevent overeating as part of a weight-loss plan. Prevent heart disease, type 2 diabetes, and certain cancers. What are tips for following this plan? Reading food labels  Check the nutrition facts label on food products for the amount of dietary fiber. Choose foods that have 5 grams of fiber or more per serving. The goals for recommended daily fiber intake include: Men (age 10 or younger): 34-38 g. Men (over age 69): 28-34 g. Women (age 7 or younger): 25-28 g. Women (over age 68): 22-25 g. Your daily fiber goal is _____________ g. Shopping Choose whole fruits and vegetables instead of processed forms, such as apple juice or applesauce. Choose a wide variety of high-fiber foods such as avocados, lentils, oats, and kidney beans. Read the nutrition facts label of the foods you choose. Be aware of foods with added fiber. These foods often have high sugar and sodium amounts per serving. Cooking Use whole-grain flour for baking and cooking. Cook with brown rice  instead of white rice. Meal planning Start the day with a breakfast that is high in fiber, such as a cereal that contains 5 g of fiber or more per serving. Eat breads and cereals that are made with whole-grain flour instead of refined flour or white flour. Eat brown rice, bulgur wheat, or millet instead of white rice. Use beans in place of meat in soups, salads, and pasta dishes. Be sure that half of the grains you eat each day are whole grains. General information You can get the recommended daily intake of dietary fiber by: Eating a variety of fruits, vegetables, grains, nuts, and beans. Taking a fiber supplement if you are not able to take in enough fiber in your diet. It is better to get fiber through food than from a supplement. Gradually increase how much fiber you consume. If you increase your intake of dietary fiber too quickly, you may have bloating, cramping, or gas. Drink plenty of water to help you digest fiber. Choose high-fiber snacks, such as berries, raw vegetables, nuts, and popcorn. What foods should I eat? Fruits Berries. Pears. Apples. Oranges. Avocado. Prunes and raisins. Dried figs. Vegetables Sweet potatoes. Spinach. Kale. Artichokes. Cabbage. Broccoli. Cauliflower.Green peas. Carrots. Squash. Grains Whole-grain breads. Multigrain cereal. Oats and oatmeal. Brown rice. Barley.Bulgur wheat. Millet. Quinoa. Bran muffins. Popcorn. Rye wafer crackers. Meats and other proteins Navy beans, kidney beans, and pinto beans. Soybeans. Split peas. Lentils. Nutsand seeds. Dairy Fiber-fortified yogurt. Beverages Fiber-fortified soy milk. Fiber-fortified orange juice. Other foods Fiber bars. The items listed above may not be a complete list of recommended foods and beverages. Contact a dietitian for more information. What foods should  I avoid? Fruits Fruit juice. Cooked, strained fruit. Vegetables Fried potatoes. Canned vegetables. Well-cooked vegetables. Grains White  bread. Pasta made with refined flour. White rice. Meats and other proteins Fatty cuts of meat. Fried chicken or fried fish. Dairy Milk. Yogurt. Cream cheese. Sour cream. Fats and oils Butters. Beverages Soft drinks. Other foods Cakes and pastries. The items listed above may not be a complete list of foods and beverages to avoid. Talk with your dietitian about what choices are best for you. Summary Fiber is a type of carbohydrate. It is found in foods such as fruits, vegetables, whole grains, and beans. A high-fiber diet has many benefits. It can help to prevent constipation, lower blood cholesterol, aid weight loss, and reduce your risk of heart disease, diabetes, and certain cancers. Increase your intake of fiber gradually. Increasing fiber too quickly may cause cramping, bloating, and gas. Drink plenty of water while you increase the amount of fiber you consume. The best sources of fiber include whole fruits and vegetables, whole grains, nuts, seeds, and beans. This information is not intended to replace advice given to you by your health care provider. Make sure you discuss any questions you have with your healthcare provider. Document Revised: 08/08/2019 Document Reviewed: 08/08/2019 Elsevier Patient Education  2022 [employer].

## 2020-12-17 ENCOUNTER — Telehealth: Payer: Self-pay

## 2020-12-17 MED ORDER — ATORVASTATIN CALCIUM 40 MG PO TABS: 20.0000 mg | ORAL_TABLET | Freq: Every day | ORAL | 3 refills | Status: DC

## 2020-12-17 MED ORDER — SEMAGLUTIDE (2 MG/DOSE) 8 MG/3ML ~~LOC~~ SOPN: 2.0000 mg | PEN_INJECTOR | SUBCUTANEOUS | 2 refills | Status: DC

## 2020-12-17 MED ORDER — OZEMPIC (1 MG/DOSE) 4 MG/3ML ~~LOC~~ SOPN: 2.0000 mg | PEN_INJECTOR | SUBCUTANEOUS | 5 refills | Status: DC

## 2020-12-17 MED ORDER — ONDANSETRON HCL 4 MG PO TABS
4.0000 mg | ORAL_TABLET | Freq: Three times a day (TID) | ORAL | 0 refills | Status: DC | PRN
Start: 2020-12-17 — End: 2023-06-23

## 2020-12-17 NOTE — Telephone Encounter (Signed)
fixed

## 2020-12-17 NOTE — Telephone Encounter (Signed)
Prescriptions for new dose sent in.

## 2020-12-17 NOTE — Telephone Encounter (Signed)
Spoke with pharmacist who states that you dosed medication wrong. She states that your instructions say inject 2mg  into the skin once a week, she states the pen is only a 1mg  pen and you can not do that dosage with pen. Please review prescription and resend in. KW

## 2020-12-17 NOTE — Telephone Encounter (Signed)
Copied from CRM [id number]. Topic: General - Other >> Dec 17, 2020  2:34 PM Gaetana Michaelis A wrote: Reason for CRM: Patient's pharmacy would like to be contacted regarding clarification on directions for patient's prescription for OZEMPIC, 1 MG/DOSE, 4 MG/3ML SOPN [[id number]]   Please contact further when possible

## 2021-01-06 ENCOUNTER — Encounter: Payer: Self-pay | Admitting: Gastroenterology

## 2021-01-07 MED ORDER — LINACLOTIDE 290 MCG PO CAPS: 290.0000 ug | ORAL_CAPSULE | Freq: Every day | ORAL | 0 refills | Status: DC

## 2021-01-07 NOTE — Telephone Encounter (Signed)
Sent medication to the pharmacy  

## 2021-01-07 NOTE — Telephone Encounter (Signed)
Can I send medication to the pharmacy

## 2021-01-12 ENCOUNTER — Other Ambulatory Visit: Payer: Self-pay

## 2021-01-12 ENCOUNTER — Encounter: Payer: Self-pay | Admitting: Obstetrics

## 2021-01-12 ENCOUNTER — Ambulatory Visit (INDEPENDENT_AMBULATORY_CARE_PROVIDER_SITE_OTHER): Payer: BC Managed Care – PPO | Admitting: Obstetrics

## 2021-01-12 VITALS — BP 120/72 | Ht 63.0 in | Wt 154.2 lb

## 2021-01-12 DIAGNOSIS — N898 Other specified noninflammatory disorders of vagina: Secondary | ICD-10-CM

## 2021-01-12 MED ORDER — NYSTATIN-TRIAMCINOLONE 100000-0.1 UNIT/GM-% EX CREA: 1.0000 "application " | TOPICAL_CREAM | Freq: Two times a day (BID) | CUTANEOUS | 1 refills | Status: AC

## 2021-01-15 NOTE — Progress Notes (Signed)
Bethany Edwards is a 40 y.o. [id number] who LMP was No LMP recorded. Patient has had an ablation., presents today for a problem visit.   Patient complains of an abnormal vaginal discharge for 7 days. Discharge described as: white, thin, and odorless. Vaginal symptoms include local irritation and vulvar itching.   Other associated symptoms: none.Menstrual pattern: Contraception:  ablation .  She denies recent antibiotic exposure, denies changes in soaps, detergents coinciding with the onset of her symptoms.  She has not previously self treated or been under treatment by another provider for these symptoms.  She is a diabetic who is on glucophage, and admits that her glucose levels are not well controlled.  BP 120/72   Ht 5\' 3"  (1.6 m)   Wt 154 lb 3.2 oz (69.9 kg)   BMI 27.32 kg/m   Physical Exam Constitutional:      Appearance: Normal appearance. She is obese.  HENT:     Head: Normocephalic and atraumatic.  Cardiovascular:     Rate and Rhythm: Normal rate and regular rhythm.  Pulmonary:     Effort: Pulmonary effort is normal.     Breath sounds: Normal breath sounds.  Abdominal:     Palpations: Abdomen is soft.     Comments: adipose  Genitourinary:    Comments: No external rashes or lesions noted. Normal vaginal rugae on spec exam: scant amount of white discharge noted in the vault. No malodor. Nuswab sent to lab. Sti screening performed Musculoskeletal:        General: Normal range of motion.  Skin:    General: Skin is warm and dry.  Neurological:     Mental Status: She is alert.      1) Risk factors for bacterial vaginosis and candida infections discussed.  We discussed normal vaginal flora/microbiome.  Any factors that may alter the microbiome increase the risk of these opportunistic infections.  These include changes in pH, antibiotic exposures, diabetes, wet bathing suits etc.  We discussed that treatment is aimed at eradicating abnormal bacterial overgrowth and or yeast.  There  may be some role for vaginal probiotics in restoring normal vaginal flora.      Will Send off a Swab for testing, and in the meantime will prescribe some Mycolog for external use. She will use OTC Monistat internally, and we will notifiy her with the results of the Nuswab.  , CNM  01/15/2021 5:17 PM

## 2021-01-18 ENCOUNTER — Other Ambulatory Visit: Payer: Self-pay | Admitting: Obstetrics

## 2021-01-18 ENCOUNTER — Encounter: Payer: Self-pay | Admitting: Obstetrics

## 2021-01-18 DIAGNOSIS — B9689 Other specified bacterial agents as the cause of diseases classified elsewhere: Secondary | ICD-10-CM

## 2021-01-18 DIAGNOSIS — N760 Acute vaginitis: Secondary | ICD-10-CM

## 2021-01-18 LAB — NUSWAB VAGINITIS (VG)
Atopobium vaginae: HIGH Score — AB
BVAB 2: HIGH Score — AB
Candida albicans, NAA: NEGATIVE
Candida glabrata, NAA: NEGATIVE
Megasphaera 1: HIGH Score — AB
Trich vag by NAA: NEGATIVE

## 2021-01-18 MED ORDER — METRONIDAZOLE 500 MG PO TABS: 500.0000 mg | ORAL_TABLET | Freq: Two times a day (BID) | ORAL | 0 refills | Status: AC

## 2021-02-18 ENCOUNTER — Ambulatory Visit: Payer: BC Managed Care – PPO | Admitting: Family Medicine

## 2021-02-28 DIAGNOSIS — F17200 Nicotine dependence, unspecified, uncomplicated: Secondary | ICD-10-CM | POA: Diagnosis not present

## 2021-02-28 DIAGNOSIS — J039 Acute tonsillitis, unspecified: Secondary | ICD-10-CM | POA: Diagnosis not present

## 2021-02-28 DIAGNOSIS — Z1152 Encounter for screening for COVID-19: Secondary | ICD-10-CM | POA: Diagnosis not present

## 2021-04-24 ENCOUNTER — Other Ambulatory Visit: Payer: Self-pay | Admitting: Gastroenterology

## 2021-05-13 ENCOUNTER — Other Ambulatory Visit: Payer: Self-pay | Admitting: Family Medicine

## 2021-05-13 NOTE — Telephone Encounter (Signed)
Requested medication (s) are due for refill today:   Yes  Requested medication (s) are on the active medication list:   Yes  Future visit scheduled:   No   Last ordered: 11/17/2020 #180, 1 refill  Returned because no more refills remain on this rx.   Requested Prescriptions  Pending Prescriptions Disp Refills   metFORMIN (GLUCOPHAGE) 500 MG tablet [Pharmacy Med Name: METFORMIN 500MG TABLETS] 180 tablet 1    Sig: TAKE 1 TABLET BY MOUTH TWICE DAILY WITH A MEAL     Endocrinology:  Diabetes - Biguanides Failed - 05/13/2021  8:04 AM      Failed - HBA1C is between 0 and 7.9 and within 180 days    Hgb A1c MFr Bld  Date Value Ref Range Status  11/17/2020 10.0 (H) 4.8 - 5.6 % Final    Comment:             Prediabetes: 5.7 - 6.4          Diabetes: >6.4          Glycemic control for adults with diabetes: <7.0           Passed - Cr in normal range and within 360 days    Creat  Date Value Ref Range Status  01/20/2017 0.73 0.50 - 1.10 mg/dL Final   Creatinine, Ser  Date Value Ref Range Status  11/17/2020 0.60 0.57 - 1.00 mg/dL Final          Passed - eGFR in normal range and within 360 days    GFR, Est African American  Date Value Ref Range Status  01/20/2017 121 > OR = 60 mL/min/1.75m Final   GFR calc Af Amer  Date Value Ref Range Status  08/26/2019 126 >59 mL/min/1.73 Final    Comment:    **[facility] currently reports eGFR in compliance with the current**   recommendations of the [employer] [facility] will   update reporting as new guidelines are published from the NKF-ASN   Task force.    GFR, Est Non African American  Date Value Ref Range Status  01/20/2017 104 > OR = 60 mL/min/1.770mFinal   GFR calc non Af Amer  Date Value Ref Range Status  08/26/2019 109 >59 mL/min/1.73 Final   eGFR  Date Value Ref Range Status  11/17/2020 115 >59 mL/min/1.73 Final          Passed - Valid encounter within last 6 months    Recent Outpatient Visits            5 months ago Type 2 diabetes mellitus with hyperglycemia, without long-term current use of insulin ([facility]  [facility]   1 year ago Herpes zoster without complication   [facility] 1 year ago Annual physical exam   [facility] [place] 2 years ago Type 2 diabetes mellitus with hyperglycemia, without long-term current use of insulin ([facility]  [facility] 2 years ago Type 2 diabetes mellitus with hyperglycemia, without long-term current use of insulin ([facility]  [facility]

## 2021-05-27 ENCOUNTER — Telehealth: Payer: Self-pay

## 2021-05-27 ENCOUNTER — Other Ambulatory Visit: Payer: Self-pay | Admitting: Family Medicine

## 2021-05-27 DIAGNOSIS — E1165 Type 2 diabetes mellitus with hyperglycemia: Secondary | ICD-10-CM

## 2021-05-27 DIAGNOSIS — E1169 Type 2 diabetes mellitus with other specified complication: Secondary | ICD-10-CM

## 2021-05-27 DIAGNOSIS — E559 Vitamin D deficiency, unspecified: Secondary | ICD-10-CM

## 2021-05-27 DIAGNOSIS — I152 Hypertension secondary to endocrine disorders: Secondary | ICD-10-CM

## 2021-05-27 MED ORDER — TRULICITY 3 MG/0.5ML ~~LOC~~ SOAJ: 3.0000 mg | SUBCUTANEOUS | 0 refills | Status: DC

## 2021-05-27 NOTE — Telephone Encounter (Signed)
Copied from CRM [id number]. Topic: Appointment Scheduling - Scheduling Inquiry for Clinic >> May 27, 2021 11:04 AM Randol Kern wrote: Reason for CRM: Pt wants to have lab work for A1C and any other lab work that PCP wants.

## 2021-05-27 NOTE — Telephone Encounter (Signed)
Pt is calling to request script for ozempic 1 mg. Dulaglutide (TRULICITY) 3 0000000 SOPN [id number]  was sent. However, trucilty makes the patient sick.  Preferred Pharmacy- [facility] [phone]

## 2021-05-27 NOTE — Telephone Encounter (Signed)
Medication Refill - Medication: Semaglutide, 1 MG/DOSE, 8 MG/3ML SOPN   Pt needs 1 MG not 2  Has the patient contacted their pharmacy? Yes.   (Agent: If no, request that the patient contact the pharmacy for the refill. If patient does not wish to contact the pharmacy document the reason why and proceed with request.) (Agent: If yes, when and what did the pharmacy advise?)  Preferred Pharmacy (with phone number or street name):  [facility] [id number], [place] [address]  [place] [address]  Phone: [phone] Fax: [phone]   Has the patient been seen for an appointment in the last year OR does the patient have an upcoming appointment? Yes.    Agent: Please be advised that RX refills may take up to 3 business days. We ask that you follow-up with your pharmacy.

## 2021-05-28 ENCOUNTER — Other Ambulatory Visit: Payer: Self-pay | Admitting: Family Medicine

## 2021-06-01 ENCOUNTER — Encounter: Payer: Self-pay | Admitting: Family Medicine

## 2021-06-01 ENCOUNTER — Ambulatory Visit: Admitting: Family Medicine

## 2021-06-01 ENCOUNTER — Other Ambulatory Visit: Payer: Self-pay

## 2021-06-01 VITALS — BP 129/86 | HR 60 | Temp 99.4°F | Wt 152.0 lb

## 2021-06-01 DIAGNOSIS — Z1159 Encounter for screening for other viral diseases: Secondary | ICD-10-CM | POA: Diagnosis not present

## 2021-06-01 DIAGNOSIS — E1165 Type 2 diabetes mellitus with hyperglycemia: Secondary | ICD-10-CM | POA: Diagnosis not present

## 2021-06-01 DIAGNOSIS — E1159 Type 2 diabetes mellitus with other circulatory complications: Secondary | ICD-10-CM | POA: Diagnosis not present

## 2021-06-01 DIAGNOSIS — F17200 Nicotine dependence, unspecified, uncomplicated: Secondary | ICD-10-CM | POA: Diagnosis not present

## 2021-06-01 DIAGNOSIS — E1169 Type 2 diabetes mellitus with other specified complication: Secondary | ICD-10-CM

## 2021-06-01 DIAGNOSIS — E559 Vitamin D deficiency, unspecified: Secondary | ICD-10-CM

## 2021-06-01 DIAGNOSIS — E785 Hyperlipidemia, unspecified: Secondary | ICD-10-CM

## 2021-06-01 DIAGNOSIS — I152 Hypertension secondary to endocrine disorders: Secondary | ICD-10-CM

## 2021-06-01 NOTE — Assessment & Plan Note (Signed)
Consent received; low risk

## 2021-06-01 NOTE — Assessment & Plan Note (Signed)
Repeat lipid panel On statin LDL goal of <70

## 2021-06-01 NOTE — Assessment & Plan Note (Signed)
Chronic, stable ?Denies CP ?Denies SOB ?Denies DOE ?No LE Edema noted on exam ?Continue medication ?Refills provided ?Seek emergent care if you develop CP, chest pain or chest pressure ? ?

## 2021-06-01 NOTE — Patient Instructions (Addendum)
1400 basal rate- maintain current weight, if you laying in bed/no activity  3 meals [id number] cals total/3 meals  9" plate, lunch/app sized plate 1/2 veggies 1/4 protein 1/4 grains   3 snacks- 100-150 cals 300-450 cals total ** use one as a dessert/bedtime snack, to assist with blood sugar spike overnight  [email or web address]

## 2021-06-01 NOTE — Progress Notes (Signed)
Established patient visit   Patient: Bethany Edwards   DOB: [birth year]   41 y.o. Female  MRN: [id number] Visit Date: 06/01/2021  Today's healthcare provider: Gwyneth Sprout, FNP   No chief complaint on file.  Subjective    HPI  Diabetes Mellitus Type II, Follow-up  Lab Results  Component Value Date   HGBA1C 10.0 (H) 11/17/2020   HGBA1C 10.4 (H) 01/13/2020   HGBA1C 8.4 (H) 08/26/2019   Wt Readings from Last 3 Encounters:  06/01/21 152 lb (68.9 kg)  01/12/21 154 lb 3.2 oz (69.9 kg)  12/14/20 156 lb 2 oz (70.8 kg)   Last seen for diabetes 6 months ago.  Management since then includes Ozempic had been increased to 2 mg but insurance coverage was denied.  Dose was then decreased back to 1 mg. She reports  good  compliance with treatment when she has the Ozempic.  However, she has been out since December. She is not having side effects.  Symptoms: No fatigue No foot ulcerations  No appetite changes No nausea  No paresthesia of the feet  No polydipsia  No polyuria No visual disturbances   No vomiting     Home blood sugar records:  150-228  Episodes of hypoglycemia? No    Current insulin regiment: none Most Recent Eye Exam: Patient is due for diabetic eye exam Current exercise yes-walking Current diet habits: well balanced  Pertinent Labs: Lab Results  Component Value Date   CHOL 218 (H) 11/17/2020   HDL 31 (L) 11/17/2020   LDLCALC 146 (H) 11/17/2020   TRIG 222 (H) 11/17/2020   CHOLHDL 7.0 (H) 11/17/2020   Lab Results  Component Value Date   NA 137 11/17/2020   K 4.3 11/17/2020   CREATININE 0.60 11/17/2020   EGFR 115 11/17/2020     ---------------------------------------------------------------------------------------------------   Medications: Outpatient Medications Prior to Visit  Medication Sig   atorvastatin (LIPITOR) 40 MG tablet Take 0.5 tablets (20 mg total) by mouth daily.   Cholecalciferol (VITAMIN D) 50 MCG (2000 UT) CAPS Take 2,000 Units by  mouth daily.   ibuprofen (ADVIL) 800 MG tablet TAKE 1 TABLET(800 MG) BY MOUTH EVERY 8 HOURS AS NEEDED FOR CRAMPING   LINZESS 290 MCG CAPS capsule TAKE 1 CAPSULE(290 MCG) BY MOUTH DAILY BEFORE AND BREAKFAST   metFORMIN (GLUCOPHAGE) 500 MG tablet Take 1 tablet (500 mg total) by mouth 2 (two) times daily with a meal.   nystatin-triamcinolone (MYCOLOG II) cream Apply 1 application topically 2 (two) times daily.   ondansetron (ZOFRAN) 4 MG tablet Take 1 tablet (4 mg total) by mouth every 8 (eight) hours as needed for nausea or vomiting.   [DISCONTINUED] Dulaglutide (TRULICITY) 3 TD/1.7OH SOPN Inject 3 mg as directed once a week.   No facility-administered medications prior to visit.    Review of Systems     Objective    BP 129/86 (BP Location: Right Arm, Patient Position: Sitting, Cuff Size: Normal)    Pulse 60    Temp 99.4 F (37.4 C) (Oral)    Wt 152 lb (68.9 kg)    SpO2 100%    BMI 26.93 kg/m    Physical Exam Vitals and nursing note reviewed.  Constitutional:      General: She is not in acute distress.    Appearance: Normal appearance. She is well-developed, well-groomed and overweight. She is not ill-appearing, toxic-appearing or diaphoretic.  HENT:     Head: Normocephalic and atraumatic.  Cardiovascular:  Rate and Rhythm: Normal rate and regular rhythm.     Pulses: Normal pulses.     Heart sounds: Normal heart sounds. No murmur heard.   No friction rub. No gallop.  Pulmonary:     Effort: Pulmonary effort is normal. No respiratory distress.     Breath sounds: Normal breath sounds. No stridor. No wheezing, rhonchi or rales.  Chest:     Chest wall: No tenderness.  Abdominal:     General: Bowel sounds are normal.     Palpations: Abdomen is soft.  Musculoskeletal:        General: No swelling, tenderness, deformity or signs of injury. Normal range of motion.     Right lower leg: No edema.     Left lower leg: No edema.  Skin:    General: Skin is warm and dry.     Capillary  Refill: Capillary refill takes less than 2 seconds.     Coloration: Skin is not jaundiced or pale.     Findings: No bruising, erythema, lesion or rash.  Neurological:     General: No focal deficit present.     Mental Status: She is alert and oriented to person, place, and time. Mental status is at baseline.     Cranial Nerves: No cranial nerve deficit.     Sensory: No sensory deficit.     Motor: No weakness.     Coordination: Coordination normal.  Psychiatric:        Mood and Affect: Mood normal.        Behavior: Behavior normal.        Thought Content: Thought content normal.        Judgment: Judgment normal.     No results found for any visits on 06/01/21.  Assessment & Plan     Problem List Items Addressed This Visit       Cardiovascular and Mediastinum   Hypertension associated with diabetes ([place])    Chronic, stable Denies CP Denies SOB Denies DOE No LE Edema noted on exam Continue medication Refills provided Seek emergent care if you develop CP, chest pain or chest pressure         Endocrine   Type 2 diabetes mellitus with hyperglycemia, without long-term current use of insulin (HCC)    Poor control, repeat A1c Discussed meal plating and meal timing Continue to encourage water- pt report that she has just recently added water into her diet 4- 16 oz bottles daily- congratulated  Refused additional referral to diabetic dietician to assist with hyperglycemia      Relevant Orders   Microalbumin / creatinine urine ratio   Comprehensive metabolic panel   Hemoglobin A1c   Ambulatory referral to Ophthalmology   Hyperlipidemia associated with type 2 diabetes mellitus ([place])    Repeat lipid panel On statin LDL goal of <70       Relevant Orders   Lipid panel     Other   Compulsive tobacco user syndrome    Occasional use of vaping for nicotine; no longer using cigarettes       Encounter for hepatitis C screening test for low risk patient - Primary    Consent  received; low risk      Relevant Orders   Hepatitis C Antibody   Avitaminosis D    Repeat vit d levels given previous known deficiency       Relevant Orders   VITAMIN D 25 Hydroxy (Vit-D Deficiency, Fractures)     Return in about 3  months (around 08/29/2021). Patient will call and make appt/ OK for virtual if needed, as she is starting a new job with a new schedule.      Argentina Ponder De[place]anto,acting as a scribe for Gwyneth Sprout, FNP.,have documented all relevant documentation on the behalf of Gwyneth Sprout, FNP,as directed by  Gwyneth Sprout, FNP while in the presence of Gwyneth Sprout, FNP.  Vonna Kotyk, FNP, have reviewed all documentation for this visit. The documentation on 06/01/21 for the exam, diagnosis, procedures, and orders are all accurate and complete.    Gwyneth Sprout, [place] [phone] (phone) [phone] (fax)  [place]

## 2021-06-01 NOTE — Assessment & Plan Note (Signed)
Occasional use of vaping for nicotine; no longer using cigarettes

## 2021-06-01 NOTE — Telephone Encounter (Signed)
Will discuss at today's visit

## 2021-06-01 NOTE — Assessment & Plan Note (Signed)
Poor control, repeat A1c Discussed meal plating and meal timing Continue to encourage water- pt report that she has just recently added water into her diet 4- 16 oz bottles daily- congratulated  Refused additional referral to diabetic dietician to assist with hyperglycemia

## 2021-06-01 NOTE — Assessment & Plan Note (Signed)
Repeat vit d levels given previous known deficiency

## 2021-06-02 ENCOUNTER — Other Ambulatory Visit: Payer: Self-pay | Admitting: Family Medicine

## 2021-06-02 DIAGNOSIS — E785 Hyperlipidemia, unspecified: Secondary | ICD-10-CM

## 2021-06-02 DIAGNOSIS — E1169 Type 2 diabetes mellitus with other specified complication: Secondary | ICD-10-CM

## 2021-06-02 DIAGNOSIS — E559 Vitamin D deficiency, unspecified: Secondary | ICD-10-CM

## 2021-06-02 DIAGNOSIS — E1165 Type 2 diabetes mellitus with hyperglycemia: Secondary | ICD-10-CM

## 2021-06-02 LAB — COMPREHENSIVE METABOLIC PANEL
ALT: 34 IU/L — ABNORMAL HIGH (ref 0–32)
AST: 14 IU/L (ref 0–40)
Albumin/Globulin Ratio: 2.1 (ref 1.2–2.2)
Albumin: 4.9 g/dL — ABNORMAL HIGH (ref 3.8–4.8)
Alkaline Phosphatase: 64 IU/L (ref 44–121)
BUN/Creatinine Ratio: 12 (ref 9–23)
BUN: 8 mg/dL (ref 6–24)
Bilirubin Total: 0.3 mg/dL (ref 0.0–1.2)
CO2: 23 mmol/L (ref 20–29)
Calcium: 9.7 mg/dL (ref 8.7–10.2)
Chloride: 103 mmol/L (ref 96–106)
Creatinine, Ser: 0.68 mg/dL (ref 0.57–1.00)
Globulin, Total: 2.3 g/dL (ref 1.5–4.5)
Glucose: 172 mg/dL — ABNORMAL HIGH (ref 70–99)
Potassium: 4.2 mmol/L (ref 3.5–5.2)
Sodium: 142 mmol/L (ref 134–144)
Total Protein: 7.2 g/dL (ref 6.0–8.5)
eGFR: 111 mL/min/{1.73_m2} (ref >59)

## 2021-06-02 LAB — MICROALBUMIN / CREATININE URINE RATIO
Creatinine, Urine: 9.8 mg/dL
Microalbumin, Urine: <3 ug/mL

## 2021-06-02 LAB — LIPID PANEL
Chol/HDL Ratio: 5.7 ratio — ABNORMAL HIGH (ref 0.0–4.4)
Cholesterol, Total: 250 mg/dL — ABNORMAL HIGH (ref 100–199)
HDL: 44 mg/dL (ref >39)
LDL Chol Calc (NIH): 171 mg/dL — ABNORMAL HIGH (ref 0–99)
Triglycerides: 190 mg/dL — ABNORMAL HIGH (ref 0–149)
VLDL Cholesterol Cal: 35 mg/dL (ref 5–40)

## 2021-06-02 LAB — HEMOGLOBIN A1C
Est. average glucose Bld gHb Est-mCnc: 163 mg/dL
Hgb A1c MFr Bld: 7.3 % — ABNORMAL HIGH (ref 4.8–5.6)

## 2021-06-02 LAB — VITAMIN D 25 HYDROXY (VIT D DEFICIENCY, FRACTURES): Vit D, 25-Hydroxy: 17.2 ng/mL — ABNORMAL LOW (ref 30.0–100.0)

## 2021-06-02 LAB — HEPATITIS C ANTIBODY: Hep C Virus Ab: NONREACTIVE

## 2021-06-02 MED ORDER — ROSUVASTATIN CALCIUM 10 MG PO TABS: 10.0000 mg | ORAL_TABLET | Freq: Every day | ORAL | 3 refills | Status: DC

## 2021-06-02 MED ORDER — VITAMIN D (ERGOCALCIFEROL) 1.25 MG (50000 UNIT) PO CAPS
50000.0000 [IU] | ORAL_CAPSULE | ORAL | 1 refills | Status: DC
Start: 2021-06-02 — End: 2022-08-02

## 2021-06-02 MED ORDER — METFORMIN HCL 500 MG PO TABS: 1000.0000 mg | ORAL_TABLET | Freq: Two times a day (BID) | ORAL | 1 refills | Status: DC

## 2021-06-14 ENCOUNTER — Encounter: Payer: Self-pay | Admitting: Family Medicine

## 2021-07-05 ENCOUNTER — Other Ambulatory Visit: Payer: Self-pay

## 2021-07-05 ENCOUNTER — Ambulatory Visit
Admission: RE | Admit: 2021-07-05 | Discharge: 2021-07-05 | Disposition: A | Discharge status: Home / Self Care | Source: Ambulatory Visit | Attending: Family Medicine | Admitting: Family Medicine

## 2021-07-05 DIAGNOSIS — Z1231 Encounter for screening mammogram for malignant neoplasm of breast: Secondary | ICD-10-CM | POA: Diagnosis present

## 2021-08-09 ENCOUNTER — Telehealth: Admitting: Physician Assistant

## 2021-08-09 ENCOUNTER — Ambulatory Visit: Payer: Self-pay | Admitting: *Deleted

## 2021-08-09 DIAGNOSIS — L02215 Cutaneous abscess of perineum: Secondary | ICD-10-CM

## 2021-08-09 MED ORDER — DOXYCYCLINE HYCLATE 100 MG PO TABS: 100.0000 mg | ORAL_TABLET | Freq: Two times a day (BID) | ORAL | 0 refills | Status: DC

## 2021-08-09 NOTE — Patient Instructions (Signed)
?  [name], thank you for joining Piedad Climes, PA-C for today's virtual visit.  While this provider is not your primary care provider (PCP), if your PCP is located in our provider database this encounter information will be shared with them immediately following your visit. ? ?Consent: ?(Patient) Bethany Edwards provided verbal consent for this virtual visit at the beginning of the encounter. ? ?Current Medications: ? ?Current Outpatient Medications:  ?  doxycycline (VIBRA-TABS) 100 MG tablet, Take 1 tablet (100 mg total) by mouth 2 (two) times daily., Disp: 14 tablet, Rfl: 0 ?  ibuprofen (ADVIL) 800 MG tablet, TAKE 1 TABLET(800 MG) BY MOUTH EVERY 8 HOURS AS NEEDED FOR CRAMPING, Disp: 30 tablet, Rfl: 1 ?  LINZESS 290 MCG CAPS capsule, TAKE 1 CAPSULE(290 MCG) BY MOUTH DAILY BEFORE AND BREAKFAST, Disp: 90 capsule, Rfl: 0 ?  metFORMIN (GLUCOPHAGE) 500 MG tablet, Take 2 tablets (1,000 mg total) by mouth 2 (two) times daily with a meal., Disp: 360 tablet, Rfl: 1 ?  nystatin-triamcinolone (MYCOLOG II) cream, Apply 1 application topically 2 (two) times daily., Disp: 30 g, Rfl: 1 ?  ondansetron (ZOFRAN) 4 MG tablet, Take 1 tablet (4 mg total) by mouth every 8 (eight) hours as needed for nausea or vomiting., Disp: 20 tablet, Rfl: 0 ?  rosuvastatin (CRESTOR) 10 MG tablet, Take 1 tablet (10 mg total) by mouth daily., Disp: 90 tablet, Rfl: 3 ?  Vitamin D, Ergocalciferol, (DRISDOL) 1.25 MG (50000 UNIT) CAPS capsule, Take 1 capsule (50,000 Units total) by mouth every 7 (seven) days., Disp: 13 capsule, Rfl: 1  ? ?Medications ordered in this encounter:  ?Meds ordered this encounter  ?Medications  ? doxycycline (VIBRA-TABS) 100 MG tablet  ?  Sig: Take 1 tablet (100 mg total) by mouth 2 (two) times daily.  ?  Dispense:  14 tablet  ?  Refill:  0  ?  Order Specific Question:   Supervising Provider  ?  Answer:   Eber Hong [[id number]]  ?  ? ?*If you need refills on other medications prior to your next appointment, please  contact your pharmacy* ? ?Follow-Up: ?Call back or seek an in-person evaluation if the symptoms worsen or if the condition fails to improve as anticipated. ? ?Other Instructions ?Please keep the skin clean and dry. ?Continue warm compresses. ?You can alternate OTC Tylenol and Motrin for pain if needed. ?Take antibiotic as directed. ?If not resolving or any new/worsening symptoms while on treatment -- you must be evaluated in person. PLEASE DO NOT DELAY CARE! ? ? ?If you have been instructed to have an in-person evaluation today at a local Urgent Care facility, please use the link below. It will take you to a list of all of our available [facility], including address, phone number and hours of operation. Please do not delay care.  ?[facility] ? ?If you or a family member do not have a primary care provider, use the link below to schedule a visit and establish care. When you choose a [facility] primary care physician or advanced practice provider, you gain a long-term partner in health. ?Find a Primary Care Provider ? ?Learn more about [facility] in-office and virtual care options: ?[facility] - Get Care Now  ?

## 2021-08-09 NOTE — Telephone Encounter (Signed)
I returned pt's call.   C/o abscess in groin area that she noticed Friday evening 08/06/2021.  She thinks it's an ingrown hair possibly. ? ? ?Reason for Disposition ? [1] Swelling is painful to touch AND [2] no fever ? ?Answer Assessment - Initial Assessment Questions ?1. APPEARANCE of SWELLING: "What does it look like?" (e.g., lymph node, insect bite, mole) ?    Left area between vaginal opening and rectum.   It's red, soft, no drainage, sore, soft fluid filled.   I get these all time.   ?2. SIZE: "How large is the swelling?" (e.g., inches, cm; or compare to size of pinhead, tip of pen, eraser, coin, pea, grape, ping pong ball)  ?    *No Answer* ?3. LOCATION: "Where is the swelling located?" ?    *No Answer* ?4. ONSET: "When did the swelling start?" ?    *No Answer* ?5. PAIN: "Is it painful?" If Yes, ask: "How much?" ?    *No Answer* ?6. ITCH: "Does it itch?" If Yes, ask: "How much?" ?    *No Answer* ?7. CAUSE: "What do you think caused the swelling?" ?    *No Answer* ?8. OTHER SYMPTOMS: "Do you have any other symptoms?" (e.g., fever) ?    *No Answer* ? ?Protocols used: Skin Lump or Localized Swelling-A-AH ? ?

## 2021-08-09 NOTE — Progress Notes (Signed)
?Virtual Visit Consent  ? ?[name], you are scheduled for a virtual visit with a [facility] provider today.   ?  ?Just as with appointments in the office, your consent must be obtained to participate.  Your consent will be active for this visit and any virtual visit you may have with one of our providers in the next 365 days.   ?  ?If you have a MyChart account, a copy of this consent can be sent to you electronically.  All virtual visits are billed to your insurance company just like a traditional visit in the office.   ? ?As this is a virtual visit, video technology does not allow for your provider to perform a traditional examination.  This may limit your provider's ability to fully assess your condition.  If your provider identifies any concerns that need to be evaluated in person or the need to arrange testing (such as labs, EKG, etc.), we will make arrangements to do so.   ?  ?Although advances in technology are sophisticated, we cannot ensure that it will always work on either your end or our end.  If the connection with a video visit is poor, the visit may have to be switched to a telephone visit.  With either a video or telephone visit, we are not always able to ensure that we have a secure connection.    ? ?Also, by engaging in this virtual visit, you consent to the provision of healthcare. Additionally, you authorize for your insurance to be billed (if applicable) for the services provided during this visit.  ? ?I need to obtain your verbal consent now.   Are you willing to proceed with your visit today?  ?  ?Bethany Edwards has provided verbal consent on 08/09/2021 for a virtual visit (video or telephone). ?  ?[name], PA-C  ? ?Date: 08/09/2021 6:44 PM ? ? ?Virtual Visit via Video Note  ? ?ILeeanne Rio, connected with  Bethany Edwards  ([id number], 09-16-1980) on 08/09/21 at  6:45 PM EDT by a video-enabled telemedicine application and verified that I am speaking with the correct  person using two identifiers. ? ?Location: ?Patient: Virtual Visit Location Patient: Home ?Provider: Virtual Visit Location Provider: Home Office ?  ?I discussed the limitations of evaluation and management by telemedicine and the availability of in person appointments. The patient expressed understanding and agreed to proceed.   ? ?History of Present Illness: ?Bethany Edwards is a 41 y.o. who identifies as a female who was assigned female at birth, and is being seen today for boil in her perineal area first noticed last week but was not really bothersome until Friday. Started epsom salt soaks and warm compresses. Helped initially but the area is now getting larger and more tender. Denies any drainage despite her compresses. Denies fever, chills. Has had these in the past and usually requires antibiotics. Could not get in with her PCP until next week.  ? ? ?HPI: HPI  ?Problems:  ?Patient Active Problem List  ? Diagnosis Date Noted  ? Hypertension associated with diabetes ([place]) 06/01/2021  ? Encounter for hepatitis C screening test for low risk patient 06/01/2021  ? Avitaminosis D 06/01/2021  ? Hyperlipidemia associated with type 2 diabetes mellitus ([place]) 11/17/2020  ? Menorrhagia with regular cycle   ? Chronic idiopathic constipation 06/16/2017  ? Overweight 12/02/2015  ? Cutaneous skin tags 02/10/2015  ? Abscess 11/13/2014  ? Allergic rhinitis 09/11/2014  ? Absolute anemia  09/11/2014  ? Anxiety 09/11/2014  ? Type 2 diabetes mellitus with hyperglycemia, without long-term current use of insulin ([place]) 09/11/2014  ? 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 Genital herpes 09/11/2014  ? Compulsive tobacco user syndrome 05/06/2007  ?  ?Allergies:  ?Allergies  ?Allergen Reactions  ? Penicillins Rash  ?  Has patient had a PCN reaction causing immediate rash, facial/tongue/throat swelling, SOB or lightheadedness with hypotension: Unknown ?Has patient had a PCN reaction causing severe rash involving mucus membranes or skin necrosis:  Unknown ?Has patient had a PCN reaction that required hospitalization: Unknown ?Has patient had a PCN reaction occurring within the last 10 years: No ?If all of the above answers are "NO", then may proceed with Cephalosporin use. ?  ? ?Medications:  ?Current Outpatient Medications:  ?  doxycycline (VIBRA-TABS) 100 MG tablet, Take 1 tablet (100 mg total) by mouth 2 (two) times daily., Disp: 14 tablet, Rfl: 0 ?  ibuprofen (ADVIL) 800 MG tablet, TAKE 1 TABLET(800 MG) BY MOUTH EVERY 8 HOURS AS NEEDED FOR CRAMPING, Disp: 30 tablet, Rfl: 1 ?  LINZESS 290 MCG CAPS capsule, TAKE 1 CAPSULE(290 MCG) BY MOUTH DAILY BEFORE AND BREAKFAST, Disp: 90 capsule, Rfl: 0 ?  metFORMIN (GLUCOPHAGE) 500 MG tablet, Take 2 tablets (1,000 mg total) by mouth 2 (two) times daily with a meal., Disp: 360 tablet, Rfl: 1 ?  nystatin-triamcinolone (MYCOLOG II) cream, Apply 1 application topically 2 (two) times daily., Disp: 30 g, Rfl: 1 ?  ondansetron (ZOFRAN) 4 MG tablet, Take 1 tablet (4 mg total) by mouth every 8 (eight) hours as needed for nausea or vomiting., Disp: 20 tablet, Rfl: 0 ?  rosuvastatin (CRESTOR) 10 MG tablet, Take 1 tablet (10 mg total) by mouth daily., Disp: 90 tablet, Rfl: 3 ?  Vitamin D, Ergocalciferol, (DRISDOL) 1.25 MG (50000 UNIT) CAPS capsule, Take 1 capsule (50,000 Units total) by mouth every 7 (seven) days., Disp: 13 capsule, Rfl: 1 ? ?Observations/Objective: ?Patient is well-developed, well-nourished in no acute distress.  ?Resting comfortably at home.  ?Head is normocephalic, atraumatic.  ?No labored breathing. ?Speech is clear and coherent with logical content.  ?Patient is alert and oriented at baseline.  ?Unable to visualize area on video due to lack of chaperone or someone there to help her properly position camera. .  ? ?Assessment and Plan: ?1. Perineal abscess ?- doxycycline (VIBRA-TABS) 100 MG tablet; Take 1 tablet (100 mg total) by mouth 2 (two) times daily.  Dispense: 14 tablet; Refill: 0 ? ?Afebrile. Continue  warm compresses. Start Doxycycline BID x 7 days. Strict in person evaluation precautions reviewed with patient.  ? ?Follow Up Instructions: ?I discussed the assessment and treatment plan with the patient. The patient was provided an opportunity to ask questions and all were answered. The patient agreed with the plan and demonstrated an understanding of the instructions.  A copy of instructions were sent to the patient via MyChart unless otherwise noted below.  ? ?The patient was advised to call back or seek an in-person evaluation if the symptoms worsen or if the condition fails to improve as anticipated. ? ?Time:  ?I spent 10 minutes with the patient via telehealth technology discussing the above problems/concerns.   ? ?[name], PA-C ?

## 2021-08-09 NOTE — Telephone Encounter (Signed)
?  Chief Complaint: infected ingrown hair.  "I get these all the time in my groin and armpits where I shave".    ?Symptoms: Warm, soft, fluid filled area that is sensitive near vaginal opening and rectum.   ?Frequency: N//A ?Pertinent Negatives: Patient denies drainage or bleeding. ?Disposition: [] ED /[] Urgent Care (no appt availability in office) / [] Appointment(In office/virtual)/ [x]  [facility]/ [] Home Care/ [] Refused Recommended Disposition /[] [facility]/ []  Follow-up with PCP ?Additional Notes: No appts on Wed. Afternoon with any of the providers.   Does not want to see , FNP.  Only time she can be seen is Wed. Afternoons due to work.  I offered her the MyChart virtual visit option.   She is at work so will think about it and get someone to help her with it there.   (Works in a ).   I gave her the MyChart Help Desk number too.     ?

## 2021-08-10 NOTE — Telephone Encounter (Signed)
Looking in chart looks like patient had a telehealth visit with [facility] yesterday to address.  ?

## 2021-08-12 ENCOUNTER — Encounter: Payer: Self-pay | Admitting: Family Medicine

## 2021-08-12 ENCOUNTER — Other Ambulatory Visit: Payer: Self-pay | Admitting: Family Medicine

## 2021-08-12 DIAGNOSIS — B379 Candidiasis, unspecified: Secondary | ICD-10-CM

## 2021-08-12 MED ORDER — FLUCONAZOLE 150 MG PO TABS: ORAL_TABLET | ORAL | 0 refills | Status: DC

## 2021-08-18 ENCOUNTER — Encounter: Payer: Self-pay | Admitting: Physician Assistant

## 2021-08-18 ENCOUNTER — Ambulatory Visit (INDEPENDENT_AMBULATORY_CARE_PROVIDER_SITE_OTHER): Admitting: Physician Assistant

## 2021-08-18 VITALS — BP 116/82 | HR 95 | Temp 98.5°F | Resp 16 | Ht 63.0 in | Wt 159.2 lb

## 2021-08-18 DIAGNOSIS — E785 Hyperlipidemia, unspecified: Secondary | ICD-10-CM

## 2021-08-18 DIAGNOSIS — E1169 Type 2 diabetes mellitus with other specified complication: Secondary | ICD-10-CM | POA: Diagnosis not present

## 2021-08-18 DIAGNOSIS — E1165 Type 2 diabetes mellitus with hyperglycemia: Secondary | ICD-10-CM | POA: Diagnosis not present

## 2021-08-18 DIAGNOSIS — E1159 Type 2 diabetes mellitus with other circulatory complications: Secondary | ICD-10-CM | POA: Diagnosis not present

## 2021-08-18 DIAGNOSIS — I152 Hypertension secondary to endocrine disorders: Secondary | ICD-10-CM

## 2021-08-18 MED ORDER — OZEMPIC (0.25 OR 0.5 MG/DOSE) 2 MG/1.5ML ~~LOC~~ SOPN: 0.2500 mg | PEN_INJECTOR | SUBCUTANEOUS | 3 refills | Status: DC

## 2021-08-18 NOTE — Assessment & Plan Note (Signed)
Well controlled continue current medications  

## 2021-08-18 NOTE — Progress Notes (Signed)
?  ? ?I,Joseline E Rosas,acting as a scribe for [employer], PA-C.,have documented all relevant documentation on the behalf of Mikey Kirschner, PA-C,as directed by  Mikey Kirschner, PA-C while in the presence of Mikey Kirschner, PA-C.  ? ? ?Established patient visit ? ? ?Patient: Bethany Edwards   DOB: July 10, 1980   41 y.o. Female  MRN: [id number] ?Visit Date: 08/18/2021 ? ?Today's healthcare provider: Mikey Kirschner, PA-C  ? ?Chief Complaint  ?Patient presents with  ? follow up chronic disease  ? ?Subjective  ?  ?HPI  ? ? ?Diabetes Mellitus Type II, follow-up ? ?Lab Results  ?Component Value Date  ? HGBA1C 7.3 (H) 06/01/2021  ? HGBA1C 10.0 (H) 11/17/2020  ? HGBA1C 10.4 (H) 01/13/2020  ? ?Last seen for diabetes 2 months ago.  ?Management since then includes continuing the same treatment. Patient was taking off Ozempic. ?She reports excellent compliance with treatment. ?She is not having side effects.  ? ?Home blood sugar records: fasting range: 250-350 ? ?Episodes of hypoglycemia? No  ?  ?Current insulin regiment: none ?Most Recent Eye Exam: Due ? ?Pt has previously tried several agents for DM:  ?-Jardiance was too expensive ?-Trulicity caused intractable nausea ? ?States that her sugar was well controlled with limited side effects on ozempic, but she was unable to refill her 1 mg dose back in December 2022 due to the shortage. Denies any issues with insurance coverage. ?--------------------------------------------------------------------------------------------------- ?Hypertension, follow-up ? ?BP Readings from Last 3 Encounters:  ?08/18/21 116/82  ?06/01/21 129/86  ?01/12/21 120/72  ? Wt Readings from Last 3 Encounters:  ?08/18/21 159 lb 3.2 oz (72.2 kg)  ?06/01/21 152 lb (68.9 kg)  ?01/12/21 154 lb 3.2 oz (69.9 kg)  ?  ? ?She was last seen for hypertension 3 months ago.  ?BP at that visit was 129/86. ?Management since that visit includes continue current medication. ?She reports good compliance with treatment. ?She  is not having side effects.  ?She is exercising. Treadmill  ?She is not adherent to low salt diet.   ?Outside blood pressures are 120/70. ? ?She does not smoke. ?--------------------------------------------------------------------------------------------------- ?Lipid/Cholesterol, follow-up ? ?Last Lipid Panel: ?Lab Results  ?Component Value Date  ? CHOL 250 (H) 06/01/2021  ? LDLCALC 171 (H) 06/01/2021  ? HDL 44 06/01/2021  ? TRIG 190 (H) 06/01/2021  ? ? ?She was last seen for this 3 months ago.  ?Management since that visit includes continue Statin. ? ?She reports good compliance with treatment. ?She is not having side effects.  ? ?Symptoms: ?Yes appetite changes No foot ulcerations  ?No chest pain No chest pressure/discomfort  ?No dyspnea No orthopnea  ?No fatigue No lower extremity edema  ?No palpitations No paroxysmal nocturnal dyspnea  ?No nausea No numbness or tingling of extremity  ?No polydipsia No polyuria  ?No speech difficulty No syncope  ? ?Last metabolic panel ?Lab Results  ?Component Value Date  ? GLUCOSE 172 (H) 06/01/2021  ? NA 142 06/01/2021  ? K 4.2 06/01/2021  ? BUN 8 06/01/2021  ? CREATININE 0.68 06/01/2021  ? EGFR 111 06/01/2021  ? GFRNONAA 109 08/26/2019  ? CALCIUM 9.7 06/01/2021  ? AST 14 06/01/2021  ? ALT 34 (H) 06/01/2021  ? ?The 10-year ASCVD risk score (Arnett DK, et al., 2019) is: 8.8% ? ?---------------------------------------------------------------------------------------------------  ? ?Medications: ?Outpatient Medications Prior to Visit  ?Medication Sig  ? fluconazole (DIFLUCAN) 150 MG tablet Take 1 tablet, by mouth, for antibiotic related yeast infection; repeat dose if symptoms remain after 4 days.  ?  ibuprofen (ADVIL) 800 MG tablet TAKE 1 TABLET(800 MG) BY MOUTH EVERY 8 HOURS AS NEEDED FOR CRAMPING  ? LINZESS 290 MCG CAPS capsule TAKE 1 CAPSULE(290 MCG) BY MOUTH DAILY BEFORE AND BREAKFAST  ? metFORMIN (GLUCOPHAGE) 500 MG tablet Take 2 tablets (1,000 mg total) by mouth 2 (two)  times daily with a meal.  ? nystatin-triamcinolone (MYCOLOG II) cream Apply 1 application topically 2 (two) times daily.  ? ondansetron (ZOFRAN) 4 MG tablet Take 1 tablet (4 mg total) by mouth every 8 (eight) hours as needed for nausea or vomiting.  ? rosuvastatin (CRESTOR) 10 MG tablet Take 1 tablet (10 mg total) by mouth daily.  ? Vitamin D, Ergocalciferol, (DRISDOL) 1.25 MG (50000 UNIT) CAPS capsule Take 1 capsule (50,000 Units total) by mouth every 7 (seven) days.  ? [DISCONTINUED] doxycycline (VIBRA-TABS) 100 MG tablet Take 1 tablet (100 mg total) by mouth 2 (two) times daily.  ? ?No facility-administered medications prior to visit.  ? ? ?Review of Systems  ?Constitutional:  Negative for appetite change, chills, fatigue and fever.  ?Respiratory:  Negative for chest tightness and shortness of breath.   ?Cardiovascular:  Negative for chest pain and palpitations.  ?Gastrointestinal:  Negative for abdominal pain, nausea and vomiting.  ?Neurological:  Negative for dizziness and weakness.  ? ?  Objective  ?  ?BP 116/82 (BP Location: Left Arm, Patient Position: Sitting, Cuff Size: Normal)   Pulse 95   Temp 98.5 ?F (36.9 ?C) (Oral)   Resp 16   Ht 5\' 3"  (1.6 m)   Wt 159 lb 3.2 oz (72.2 kg)   BMI 28.20 kg/m?  ? ? ?Physical Exam ?Constitutional:   ?   General: She is awake.  ?   Appearance: She is well-developed.  ?HENT:  ?   Head: Normocephalic.  ?Eyes:  ?   Conjunctiva/sclera: Conjunctivae normal.  ?Cardiovascular:  ?   Rate and Rhythm: Normal rate and regular rhythm.  ?   Heart sounds: Normal heart sounds.  ?Pulmonary:  ?   Effort: Pulmonary effort is normal.  ?   Breath sounds: Normal breath sounds.  ?Skin: ?   General: Skin is warm.  ?Neurological:  ?   Mental Status: She is alert and oriented to person, place, and time.  ?Psychiatric:     ?   Attention and Perception: Attention normal.     ?   Mood and Affect: Mood normal.     ?   Speech: Speech normal.     ?   Behavior: Behavior is cooperative.  ?  ? ?No  results found for any visits on 08/18/21. ? Assessment & Plan  ?  ? ?Problem List Items Addressed This Visit   ? ?  ? Cardiovascular and Mediastinum  ? Hypertension associated with diabetes ([place])  ?  Well controlled continue current medications ? ?  ?  ? Relevant Medications  ? Semaglutide,0.25 or 0.5MG /DOS, (OZEMPIC, 0.25 OR 0.5 MG/DOSE,) 2 MG/1.5ML SOPN  ?  ? Endocrine  ? Type 2 diabetes mellitus with hyperglycemia, without long-term current use of insulin ([place]) - Primary  ?  uacr completed 06/01/21 ?Discussed multiple medication options w/ pt ?Advised that through epic, it appears her insurance does not cover ozempic, but has she has received and used without issue, will re-rx. ?May need a repeat PA. ?rx ozempic 0.25 mg weekly x 4 weeks, increase to 0.5 mg weekly x 4 weeks. ?She will likely need 1 mg dose after this titration ? ?Currently taking metformin 1000  mg BID, but has 2+ BM daily, plans to decrease to 500 mg TID after starting ozempic ? ?  ?  ? Relevant Medications  ? Semaglutide,0.25 or 0.5MG/DOS, (OZEMPIC, 0.25 OR 0.5 MG/DOSE,) 2 MG/1.5ML SOPN  ? Other Relevant Orders  ? Hemoglobin A1c  ? Hyperlipidemia associated with type 2 diabetes mellitus ([place])  ?  Uncontrolled on crestor 10 mg. Will get fasting labs and then likely increase dose ?Advised pt goal is LDL < 70.  ? ? ?  ?  ? Relevant Medications  ? Semaglutide,0.25 or 0.5MG/DOS, (OZEMPIC, 0.25 OR 0.5 MG/DOSE,) 2 MG/1.5ML SOPN  ? Other Relevant Orders  ? Lipid panel  ?  ? ?Return in about 3 months (around 11/18/2021) for DMII.  ?   ? ?I, Mikey Kirschner, PA-C have reviewed all documentation for this visit. The documentation on  08/18/2021  for the exam, diagnosis, procedures, and orders are all accurate and complete. ? ?Mikey Kirschner, PA-C ?[facility] ?[id number] ?[place], [place], [address] ?Office: [phone] ?Fax: [phone]  ? ?[facility]  ?

## 2021-08-18 NOTE — Assessment & Plan Note (Signed)
uacr completed 06/01/21 ?Discussed multiple medication options w/ pt ?Advised that through epic, it appears her insurance does not cover ozempic, but has she has received and used without issue, will re-rx. ?May need a repeat PA. ?rx ozempic 0.25 mg weekly x 4 weeks, increase to 0.5 mg weekly x 4 weeks. ?She will likely need 1 mg dose after this titration ? ?Currently taking metformin 1000 mg BID, but has 2+ BM daily, plans to decrease to 500 mg TID after starting ozempic ?

## 2021-08-18 NOTE — Assessment & Plan Note (Addendum)
Uncontrolled on crestor 10 mg. Will get fasting labs and then likely increase dose ?Advised pt goal is LDL < 70.  ? ?

## 2021-08-23 ENCOUNTER — Encounter: Payer: Self-pay | Admitting: Physician Assistant

## 2021-08-26 ENCOUNTER — Telehealth: Payer: Commercial Managed Care - PPO | Admitting: Family Medicine

## 2021-08-26 DIAGNOSIS — L02215 Cutaneous abscess of perineum: Secondary | ICD-10-CM

## 2021-08-26 MED ORDER — FLUCONAZOLE 150 MG PO TABS: ORAL_TABLET | ORAL | 0 refills | Status: DC

## 2021-08-26 MED ORDER — SULFAMETHOXAZOLE-TRIMETHOPRIM 800-160 MG PO TABS: 1.0000 | ORAL_TABLET | Freq: Two times a day (BID) | ORAL | 0 refills | Status: AC

## 2021-08-26 NOTE — Patient Instructions (Signed)
Skin Abscess  A skin abscess is an infected area of your skin that contains pus and other material. An abscess can happen in any part of your body. Some abscesses break open (rupture) on their own. Most continue to get worse unless they are treated. The infection can spread deeper into the body and into your blood, which can make you feel sick. A skin abscess is caused by germs that enter the skin through a cut or scrape. It can also be caused by blocked oil and sweat glands or infected hair follicles. This condition is usually treated by: Draining the pus. Taking antibiotic medicines. Placing a warm, wet washcloth over the abscess. Follow these instructions at home: Medicines  Take over-the-counter and prescription medicines only as told by your doctor. If you were prescribed an antibiotic medicine, take it as told by your doctor. Do not stop taking the antibiotic even if you start to feel better. Abscess care  If you have an abscess that has not drained, place a warm, clean, wet washcloth over the abscess several times a day. Do this as told by your doctor. Follow instructions from your doctor about how to take care of your abscess. Make sure you: Cover the abscess with a bandage (dressing). Change your bandage or gauze as told by your doctor. Wash your hands with soap and water before you change the bandage or gauze. If you cannot use soap and water, use hand sanitizer. Check your abscess every day for signs that the infection is getting worse. Check for: More redness, swelling, or pain. More fluid or blood. Warmth. More pus or a bad smell. General instructions To avoid spreading the infection: Do not share personal care items, towels, or hot tubs with others. Avoid making skin-to-skin contact with other people. Keep all follow-up visits as told by your doctor. This is important. Contact a doctor if: You have more redness, swelling, or pain around your abscess. You have more fluid  or blood coming from your abscess. Your abscess feels warm when you touch it. You have more pus or a bad smell coming from your abscess. Your muscles ache. You feel sick. Get help right away if: You have very bad (severe) pain. You see red streaks on your skin spreading away from the abscess. You see redness that spreads quickly. You have a fever or chills. Summary A skin abscess is an infected area of your skin that contains pus and other material. The abscess is caused by germs that enter the skin through a cut or scrape. It can also be caused by blocked oil and sweat glands or infected hair follicles. Follow your doctor's instructions on caring for your abscess, taking medicines, preventing infections, and keeping follow-up visits. This information is not intended to replace advice given to you by your health care provider. Make sure you discuss any questions you have with your health care provider. Document Revised: 01/11/2021 Document Reviewed: 01/11/2021 Elsevier Patient Education  2023 Elsevier Inc.  

## 2021-08-26 NOTE — Progress Notes (Signed)
?Virtual Visit Consent  ? ?[name], you are scheduled for a virtual visit with a [facility] provider today. Just as with appointments in the office, your consent must be obtained to participate. Your consent will be active for this visit and any virtual visit you may have with one of our providers in the next 365 days. If you have a MyChart account, a copy of this consent can be sent to you electronically. ? ?As this is a virtual visit, video technology does not allow for your provider to perform a traditional examination. This may limit your provider's ability to fully assess your condition. If your provider identifies any concerns that need to be evaluated in person or the need to arrange testing (such as labs, EKG, etc.), we will make arrangements to do so. Although advances in technology are sophisticated, we cannot ensure that it will always work on either your end or our end. If the connection with a video visit is poor, the visit may have to be switched to a telephone visit. With either a video or telephone visit, we are not always able to ensure that we have a secure connection. ? ?By engaging in this virtual visit, you consent to the provision of healthcare and authorize for your insurance to be billed (if applicable) for the services provided during this visit. Depending on your insurance coverage, you may receive a charge related to this service. ? ?I need to obtain your verbal consent now. Are you willing to proceed with your visit today? Bethany Edwards has provided verbal consent on 08/26/2021 for a virtual visit (video or telephone). Perlie Mayo, NP ? ?Date: 08/26/2021 8:31 AM ? ?Virtual Visit via Video Note  ? ?[name], connected with  Bethany Edwards  ([id number], February 23, 1981) on 08/26/21 at  8:30 AM EDT by a video-enabled telemedicine application and verified that I am speaking with the correct person using two identifiers. ? ?Location: ?Patient: Virtual Visit Location Patient:  Home ?Provider: Virtual Visit Location Provider: Home Office ?  ?I discussed the limitations of evaluation and management by telemedicine and the availability of in person appointments. The patient expressed understanding and agreed to proceed.   ? ?History of Present Illness: ?Bethany Edwards is a 41 y.o. who identifies as a female who was assigned female at birth, and is being seen today for follow up on perineal area- was treated on 08/09/21 with Doxy but reports it did not full clear with full course of doxy- she still has a quarter size area remaining- down from "50 cent piece"- drainage still present, still painful with underwear. Still doing epsom salt soaks and warm compresses as well.  Denies fever, chills. Has had these in the past and usually requires antibiotics. Could not get in with her PCP until end of week. Of note- she reports shaving frequently that might be the cause.  ? ? ?Problems:  ?Patient Active Problem List  ? Diagnosis Date Noted  ? Hypertension associated with diabetes ([place]) 06/01/2021  ? Encounter for hepatitis C screening test for low risk patient 06/01/2021  ? Avitaminosis D 06/01/2021  ? Hyperlipidemia associated with type 2 diabetes mellitus ([place]) 11/17/2020  ? Menorrhagia with regular cycle   ? Chronic idiopathic constipation 06/16/2017  ? Overweight 12/02/2015  ? Cutaneous skin tags 02/10/2015  ? Abscess 11/13/2014  ? Allergic rhinitis 09/11/2014  ? Absolute anemia 09/11/2014  ? Anxiety 09/11/2014  ? Type 2 diabetes mellitus with hyperglycemia, without long-term current  use of insulin ([place]) 09/11/2014  ? 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 Genital herpes 09/11/2014  ? Compulsive tobacco user syndrome 05/06/2007  ?  ?Allergies:  ?Allergies  ?Allergen Reactions  ? Penicillins Rash  ?  Has patient had a PCN reaction causing immediate rash, facial/tongue/throat swelling, SOB or lightheadedness with hypotension: Unknown ?Has patient had a PCN reaction causing severe rash involving mucus  membranes or skin necrosis: Unknown ?Has patient had a PCN reaction that required hospitalization: Unknown ?Has patient had a PCN reaction occurring within the last 10 years: No ?If all of the above answers are "NO", then may proceed with Cephalosporin use. ?  ? ?Medications:  ?Current Outpatient Medications:  ?  fluconazole (DIFLUCAN) 150 MG tablet, Take 1 tablet, by mouth, for antibiotic related yeast infection; repeat dose if symptoms remain after 4 days., Disp: 2 tablet, Rfl: 0 ?  ibuprofen (ADVIL) 800 MG tablet, TAKE 1 TABLET(800 MG) BY MOUTH EVERY 8 HOURS AS NEEDED FOR CRAMPING, Disp: 30 tablet, Rfl: 1 ?  LINZESS 290 MCG CAPS capsule, TAKE 1 CAPSULE(290 MCG) BY MOUTH DAILY BEFORE AND BREAKFAST, Disp: 90 capsule, Rfl: 0 ?  metFORMIN (GLUCOPHAGE) 500 MG tablet, Take 2 tablets (1,000 mg total) by mouth 2 (two) times daily with a meal., Disp: 360 tablet, Rfl: 1 ?  nystatin-triamcinolone (MYCOLOG II) cream, Apply 1 application topically 2 (two) times daily., Disp: 30 g, Rfl: 1 ?  ondansetron (ZOFRAN) 4 MG tablet, Take 1 tablet (4 mg total) by mouth every 8 (eight) hours as needed for nausea or vomiting., Disp: 20 tablet, Rfl: 0 ?  rosuvastatin (CRESTOR) 10 MG tablet, Take 1 tablet (10 mg total) by mouth daily., Disp: 90 tablet, Rfl: 3 ?  Semaglutide,0.25 or 0.5MG /DOS, (OZEMPIC, 0.25 OR 0.5 MG/DOSE,) 2 MG/1.5ML SOPN, Inject 0.25 mg into the skin once a week. For four weeks and then inject 0.5 mg weekly for four weeks, Disp: 1.5 mL, Rfl: 3 ?  Vitamin D, Ergocalciferol, (DRISDOL) 1.25 MG (50000 UNIT) CAPS capsule, Take 1 capsule (50,000 Units total) by mouth every 7 (seven) days., Disp: 13 capsule, Rfl: 1 ? ?Observations/Objective: ?Patient is well-developed, well-nourished in no acute distress.  ?Resting comfortably  at home.  ?Head is normocephalic, atraumatic.  ?No labored breathing.  ?Speech is clear and coherent with logical content.  ?Patient is alert and oriented at baseline.  ?Unable to visualize area on video  due to lack of chaperone or someone there to help her properly position camera.  ? ?Assessment and Plan: ?1. Perineal abscess ? ?- fluconazole (DIFLUCAN) 150 MG tablet; Repeat in 3 days if needed  Dispense: 2 tablet; Refill: 0 ?- sulfamethoxazole-trimethoprim (BACTRIM DS) 800-160 MG tablet; Take 1 tablet by mouth 2 (two) times daily for 7 days.  Dispense: 14 tablet; Refill: 0 ? ?Encouraged to look in to other types of hair removal ?Advised to complete full course and f/u with GYN or PCP if not improved ? ? Reviewed side effects, risks and benefits of medication.   ?Patient acknowledged agreement and understanding of the plan.  ? ? ? ? ?Follow Up Instructions: ?I discussed the assessment and treatment plan with the patient. The patient was provided an opportunity to ask questions and all were answered. The patient agreed with the plan and demonstrated an understanding of the instructions.  A copy of instructions were sent to the patient via MyChart unless otherwise noted below.  ? ? ? ?The patient was advised to call back or seek an in-person evaluation if the symptoms worsen  or if the condition fails to improve as anticipated. ? ?Time:  ?I spent 15 minutes with the patient via telehealth technology discussing the above problems/concerns.   ? ?Perlie Mayo, NP ? ?

## 2021-08-31 NOTE — Progress Notes (Signed)
?  ? ? ?I,Bethany Edwards,acting as a Neurosurgeon for [employer], PA-C.,have documented all relevant documentation on the behalf of Bethany Lat, PA-C,as directed by  [employer], PA-C while in the presence of [employer], PA-C. ? ?Established patient visit ? ? ?Patient: Bethany Edwards   DOB: 03-20-1981   41 y.o. Female  MRN: [id number] ?Visit Date: 09/01/2021 ? ?Today's healthcare provider: Debera Lat, PA-C  ? ?Chief Complaint  ?Patient presents with  ? Follow-up  ?CC: abscess at groin   ?Subjective  ?  ?HPI  ?Abscess: Patient presents for evaluation of a cutaneous abscess. Lesion is located in the left sided perineal region. Onset was 1 mo ago. Symptoms have essentially resolved. Abscess has associated symptoms of none. Patient does have previous history of cutaneous abscesses. Patient does have diabetes. ? ?She was treated on 08/09/21 with Doxy then on 08/26/21 /video visit she was rx fluconazole and bactrim ds 800-160mg  tablet/7 days. Patient feels that the abscess has improved, but the wound may be "tunneling." ? ?Medications: ?Outpatient Medications Prior to Visit  ?Medication Sig  ? fluconazole (DIFLUCAN) 150 MG tablet Repeat in 3 days if needed  ? ibuprofen (ADVIL) 800 MG tablet TAKE 1 TABLET(800 MG) BY MOUTH EVERY 8 HOURS AS NEEDED FOR CRAMPING  ? LINZESS 290 MCG CAPS capsule TAKE 1 CAPSULE(290 MCG) BY MOUTH DAILY BEFORE AND BREAKFAST  ? metFORMIN (GLUCOPHAGE) 500 MG tablet Take 2 tablets (1,000 mg total) by mouth 2 (two) times daily with a meal.  ? nystatin-triamcinolone (MYCOLOG II) cream Apply 1 application topically 2 (two) times daily.  ? ondansetron (ZOFRAN) 4 MG tablet Take 1 tablet (4 mg total) by mouth every 8 (eight) hours as needed for nausea or vomiting.  ? rosuvastatin (CRESTOR) 10 MG tablet Take 1 tablet (10 mg total) by mouth daily.  ? Semaglutide,0.25 or 0.5MG /DOS, (OZEMPIC, 0.25 OR 0.5 MG/DOSE,) 2 MG/1.5ML SOPN Inject 0.25 mg into the skin once a week. For four weeks and then inject 0.5 mg  weekly for four weeks  ? sulfamethoxazole-trimethoprim (BACTRIM DS) 800-160 MG tablet Take 1 tablet by mouth 2 (two) times daily for 7 days.  ? Vitamin D, Ergocalciferol, (DRISDOL) 1.25 MG (50000 UNIT) CAPS capsule Take 1 capsule (50,000 Units total) by mouth every 7 (seven) days.  ? ?No facility-administered medications prior to visit.  ? ? ?Review of Systems  ?All other systems reviewed and are negative. ?Except HPI ? ?  Objective  ?  ?BP 135/78 (BP Location: Left Arm, Patient Position: Sitting, Cuff Size: Normal)   Pulse 86   Temp 98.4 ?F (36.9 ?C) (Oral)   Wt 159 lb (72.1 kg)   SpO2 99% Comment: room air  BMI 28.17 kg/m?  ? ? ?Physical Exam ?Vitals reviewed.  ?Constitutional:   ?   Appearance: Normal appearance.  ?HENT:  ?   Head: Normocephalic and atraumatic.  ?Cardiovascular:  ?   Pulses: Normal pulses.  ?Pulmonary:  ?   Effort: Pulmonary effort is normal.  ?Genitourinary: ?   General: Normal vulva.  ?   Vagina: No vaginal discharge.  ?   Rectum: Normal.  ?   Comments: Non tender 0.3/0.2 mm slightly raised skin colored papula on the left side of perineum ?Musculoskeletal:     ?   General: Normal range of motion.  ?   Cervical back: Normal range of motion.  ?Skin: ?   General: Skin is warm.  ?Neurological:  ?   General: No focal deficit present.  ?  Mental Status: She is alert and oriented to person, place, and time.  ?   Cranial Nerves: No cranial nerve deficit.  ?   Sensory: No sensory deficit.  ?   Motor: No weakness.  ?   Coordination: Coordination normal.  ?   Gait: Gait normal.  ?   Deep Tendon Reflexes: Reflexes normal.  ?Psychiatric:     ?   Behavior: Behavior normal.     ?   Thought Content: Thought content normal.     ?   Judgment: Judgment normal.  ?  ? ?No results found for any visits on 09/01/21. ? Assessment & Plan  ?  ? ?1. Perineal abscess ?Resolved ?Encouraged to look in to other types of hair removal/laser, wax. ? ?The patient was advised to call back or seek an in-person evaluation if  the symptoms worsen or if the condition fails to improve as anticipated. ? ?FU with her PCP ? ?I discussed the assessment and treatment plan with the patient. The patient was provided an opportunity to ask questions and all were answered. The patient agreed with the plan and demonstrated an understanding of the instructions. ? ?The entirety of the information documented in the History of Present Illness, Review of Systems and Physical Exam were personally obtained by me. Portions of this information were initially documented by the CMA and reviewed by me for thoroughness and accuracy.  ?Portions of this note were created using dictation software and may contain typographical errors.  ? ? ?Bethany Lat, PA-C  ?[facility] ?[phone] (phone) ?[phone] (fax) ? ?[facility] ?

## 2021-09-01 ENCOUNTER — Encounter: Payer: Self-pay | Admitting: Physician Assistant

## 2021-09-01 ENCOUNTER — Ambulatory Visit (INDEPENDENT_AMBULATORY_CARE_PROVIDER_SITE_OTHER): Payer: Commercial Managed Care - PPO | Admitting: Physician Assistant

## 2021-09-01 VITALS — BP 135/78 | HR 86 | Temp 98.4°F | Wt 159.0 lb

## 2021-09-01 DIAGNOSIS — L02215 Cutaneous abscess of perineum: Secondary | ICD-10-CM | POA: Diagnosis not present

## 2021-09-02 ENCOUNTER — Encounter: Payer: Self-pay | Admitting: Physician Assistant

## 2021-09-02 LAB — HEMOGLOBIN A1C
Est. average glucose Bld gHb Est-mCnc: 232 mg/dL
Hgb A1c MFr Bld: 9.7 % — ABNORMAL HIGH (ref 4.8–5.6)

## 2021-09-02 LAB — LIPID PANEL
Chol/HDL Ratio: 3.8 ratio (ref 0.0–4.4)
Cholesterol, Total: 124 mg/dL (ref 100–199)
HDL: 33 mg/dL — ABNORMAL LOW (ref >39)
LDL Chol Calc (NIH): 54 mg/dL (ref 0–99)
Triglycerides: 231 mg/dL — ABNORMAL HIGH (ref 0–149)
VLDL Cholesterol Cal: 37 mg/dL (ref 5–40)

## 2021-09-15 ENCOUNTER — Other Ambulatory Visit: Payer: Self-pay | Admitting: Physician Assistant

## 2021-09-15 DIAGNOSIS — E1165 Type 2 diabetes mellitus with hyperglycemia: Secondary | ICD-10-CM

## 2021-09-15 LAB — HM DIABETES EYE EXAM

## 2021-09-15 MED ORDER — SEMAGLUTIDE (1 MG/DOSE) 4 MG/3ML ~~LOC~~ SOPN: 1.0000 mg | PEN_INJECTOR | SUBCUTANEOUS | 1 refills | Status: DC

## 2021-09-26 ENCOUNTER — Encounter: Payer: Self-pay | Admitting: Physician Assistant

## 2021-10-07 ENCOUNTER — Encounter (INDEPENDENT_AMBULATORY_CARE_PROVIDER_SITE_OTHER): Payer: Commercial Managed Care - PPO | Admitting: Physician Assistant

## 2021-10-07 DIAGNOSIS — L819 Disorder of pigmentation, unspecified: Secondary | ICD-10-CM

## 2021-10-13 NOTE — Telephone Encounter (Signed)

## 2021-10-20 ENCOUNTER — Encounter: Payer: Self-pay | Admitting: Physician Assistant

## 2021-10-20 ENCOUNTER — Ambulatory Visit (INDEPENDENT_AMBULATORY_CARE_PROVIDER_SITE_OTHER): Payer: Commercial Managed Care - PPO | Admitting: Physician Assistant

## 2021-10-20 ENCOUNTER — Ambulatory Visit
Admission: RE | Admit: 2021-10-20 | Discharge: 2021-10-20 | Disposition: A | Discharge status: Home / Self Care | Payer: Commercial Managed Care - PPO | Attending: Physician Assistant | Admitting: Physician Assistant

## 2021-10-20 ENCOUNTER — Ambulatory Visit
Admission: RE | Admit: 2021-10-20 | Discharge: 2021-10-20 | Disposition: A | Discharge status: Home / Self Care | Payer: Commercial Managed Care - PPO | Source: Ambulatory Visit | Attending: Physician Assistant | Admitting: Physician Assistant

## 2021-10-20 VITALS — BP 124/76 | HR 92 | Ht 63.0 in | Wt 153.5 lb

## 2021-10-20 DIAGNOSIS — M79671 Pain in right foot: Secondary | ICD-10-CM | POA: Insufficient documentation

## 2021-10-20 DIAGNOSIS — E1165 Type 2 diabetes mellitus with hyperglycemia: Secondary | ICD-10-CM | POA: Diagnosis not present

## 2021-10-20 MED ORDER — SEMAGLUTIDE (1 MG/DOSE) 4 MG/3ML ~~LOC~~ SOPN: 1.0000 mg | PEN_INJECTOR | SUBCUTANEOUS | 1 refills | Status: DC

## 2021-10-20 NOTE — Assessment & Plan Note (Signed)
Refilled 1 mg ozempic until next f/u next month. Will check a1c at that time and determine dose. Pt feels well, minimal nausea.

## 2021-10-20 NOTE — Assessment & Plan Note (Addendum)
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

## 2021-10-20 NOTE — Progress Notes (Signed)
I,[name],acting as a Neurosurgeon for [employer], PA-C.,have documented all relevant documentation on the behalf of Alfredia Ferguson, PA-C,as directed by  Alfredia Ferguson, PA-C while in the presence of Alfredia Ferguson, PA-C.   Established patient visit   Patient: Bethany Edwards   DOB: 1980/06/06   41 y.o. Female  MRN: [id number] Visit Date: 10/20/2021  Today's healthcare provider: Alfredia Ferguson, PA-C   Cc. Foot pain  Subjective    HPI   Pain  She reports new onset right foot pain mainly 4th toe. Describes the pain as a shooting/burning pain, worse w/ touch of the 4th toe or inversion of the ankle. Denies injury, redness, swelling ,bruising. Denies numbness. X 1 month Aggravating factors: putting on socks or rubbing against something soft  Relieving factors: none.  She has tried acetaminophen with no relief.   ---------------------------------------------------------------------------------------------------   Medications: Outpatient Medications Prior to Visit  Medication Sig   fluconazole (DIFLUCAN) 150 MG tablet Repeat in 3 days if needed   ibuprofen (ADVIL) 800 MG tablet TAKE 1 TABLET(800 MG) BY MOUTH EVERY 8 HOURS AS NEEDED FOR CRAMPING   LINZESS 290 MCG CAPS capsule TAKE 1 CAPSULE(290 MCG) BY MOUTH DAILY BEFORE AND BREAKFAST   metFORMIN (GLUCOPHAGE) 500 MG tablet Take 2 tablets (1,000 mg total) by mouth 2 (two) times daily with a meal.   nystatin-triamcinolone (MYCOLOG II) cream Apply 1 application topically 2 (two) times daily.   ondansetron (ZOFRAN) 4 MG tablet Take 1 tablet (4 mg total) by mouth every 8 (eight) hours as needed for nausea or vomiting.   rosuvastatin (CRESTOR) 10 MG tablet Take 1 tablet (10 mg total) by mouth daily.   Vitamin D, Ergocalciferol, (DRISDOL) 1.25 MG (50000 UNIT) CAPS capsule Take 1 capsule (50,000 Units total) by mouth every 7 (seven) days.   [DISCONTINUED] Semaglutide, 1 MG/DOSE, 4 MG/3ML SOPN Inject 1 mg into the skin once a week.    [DISCONTINUED] Semaglutide,0.25 or 0.5MG /DOS, (OZEMPIC, 0.25 OR 0.5 MG/DOSE,) 2 MG/1.5ML SOPN Inject 0.25 mg into the skin once a week. For four weeks and then inject 0.5 mg weekly for four weeks   No facility-administered medications prior to visit.    Review of Systems  Constitutional:  Negative for fatigue and fever.  Respiratory:  Negative for cough and shortness of breath.   Cardiovascular:  Negative for chest pain and leg swelling.  Gastrointestinal:  Negative for abdominal pain.  Musculoskeletal:  Positive for arthralgias.  Neurological:  Negative for dizziness and headaches.      Objective    BP 124/76 (BP Location: Right Arm, Patient Position: Sitting, Cuff Size: Normal)   Pulse 92   Ht 5\' 3"  (1.6 m)   Wt 153 lb 8 oz (69.6 kg)   SpO2 100%   BMI 27.19 kg/m    Physical Exam Vitals reviewed.  Constitutional:      Appearance: She is not ill-appearing.  HENT:     Head: Normocephalic.  Eyes:     Conjunctiva/sclera: Conjunctivae normal.  Cardiovascular:     Rate and Rhythm: Normal rate.     Pulses:          Dorsalis pedis pulses are 2+ on the right side.  Pulmonary:     Effort: Pulmonary effort is normal. No respiratory distress.  Musculoskeletal:     Right ankle: Tenderness present. Normal range of motion.     Right foot: Normal range of motion. Tenderness present.     Comments: Tenderness to lateral R ankle and  to 4th metatarsal. No overlying edema, erythema, ecchymosis.   Feet:     Right foot:     Protective Sensation: 4 sites tested.  4 sites sensed.     Skin integrity: Skin integrity normal.     Toenail Condition: Right toenails are normal.  Neurological:     General: No focal deficit present.     Mental Status: She is alert and oriented to person, place, and time.  Psychiatric:        Mood and Affect: Mood normal.        Behavior: Behavior normal.      No results found for any visits on 10/20/21.  Assessment & Plan     Problem List Items Addressed  This Visit       Endocrine   Type 2 diabetes mellitus with hyperglycemia, without long-term current use of insulin (HCC)    Refilled 1 mg ozempic until next f/u next month. Will check a1c at that time and determine dose. Pt feels well, minimal nausea.      Relevant Medications   Semaglutide, 1 MG/DOSE, 4 MG/3ML SOPN     Other   Right foot pain - Primary    Would be an unusual presentation of dm neuropathy given ROM elucidates pain  Will start w/ xrays of the foot and ankle In the meantime-- ice, elevation, nsaids if needed.      Relevant Orders   DG Foot Complete Right   DG Ankle Complete Right     Return as scheduled.      I, Alfredia Ferguson, PA-C have reviewed all documentation for this visit. The documentation on  10/20/2021 for the exam, diagnosis, procedures, and orders are all accurate and complete.   Alfredia Ferguson, PA-C [facility] [address] [place], [place], [address] Office: [phone] Fax: [phone]   [facility]

## 2021-11-13 ENCOUNTER — Other Ambulatory Visit: Payer: Self-pay | Admitting: Family Medicine

## 2021-11-13 DIAGNOSIS — E559 Vitamin D deficiency, unspecified: Secondary | ICD-10-CM

## 2021-11-15 NOTE — Telephone Encounter (Signed)
Requested medications are due for refill today.  unsure  Requested medications are on the active medications list.  yes  Last refill. 13/1 refill 06/02/2021  Future visit scheduled.   yes  Notes to clinic.  Provider to review.     Requested Prescriptions  Pending Prescriptions Disp Refills   Vitamin D, Ergocalciferol, (DRISDOL) 1.25 MG (50000 UNIT) CAPS capsule [Pharmacy Med Name: VITAMIN D2 50,000IU (ERGO) CAP RX] 13 capsule 1    Sig: TAKE 1 CAPSULE BY MOUTH EVERY 7 DAYS     Endocrinology:  Vitamins - Vitamin D Supplementation 2 Failed - 11/13/2021 10:12 AM      Failed - Manual Review: Route requests for 50,000 IU strength to the provider      Failed - Vitamin D in normal range and within 360 days    Vit D, 25-Hydroxy  Date Value Ref Range Status  06/01/2021 17.2 (L) 30.0 - 100.0 ng/mL Final    Comment:    Vitamin D deficiency has been defined by the Institute of Medicine and an Endocrine Society practice guideline as a level of serum 25-OH vitamin D less than 20 ng/mL (1,2). The Endocrine Society went on to further define vitamin D insufficiency as a level between 21 and 29 ng/mL (2). 1. IOM (Institute of Medicine). 2010. Dietary reference    intakes for calcium and D. [place]: The    [employer]. 2. Holick MF, Binkley Frostproof, Bischoff-Ferrari HA, et al.    Evaluation, treatment, and prevention of vitamin D    deficiency: an Endocrine Society clinical practice    guideline. JCEM. 2011 Jul; 96(7):1911-30.          Passed - Ca in normal range and within 360 days    Calcium  Date Value Ref Range Status  06/01/2021 9.7 8.7 - 10.2 mg/dL Final         Passed - Valid encounter within last 12 months    Recent Outpatient Visits           3 weeks ago Right foot pain   [facility] Alfredia Ferguson, PA-C   2 months ago Perineal abscess   [facility] [place], [place], PA-C   2 months ago Type 2 diabetes mellitus with hyperglycemia,  without long-term current use of insulin [facility])   [facility] [place], [place], PA-C   5 months ago Encounter for hepatitis C screening test for low risk patient   [facility] Merita Norton T, FNP   12 months ago Type 2 diabetes mellitus with hyperglycemia, without long-term current use of insulin [facility])   [facility], Marzella Schlein, MD       Future Appointments             In 1 week [place] Edwards, Lillia Abed, PA-C [facility]   In 5 months Willeen Niece, MD [facility]

## 2021-11-24 ENCOUNTER — Encounter: Payer: Self-pay | Admitting: Physician Assistant

## 2021-11-24 ENCOUNTER — Ambulatory Visit: Payer: Commercial Managed Care - PPO | Admitting: Physician Assistant

## 2021-11-24 VITALS — BP 113/70 | HR 97 | Ht 63.0 in | Wt 149.9 lb

## 2021-11-24 DIAGNOSIS — E1165 Type 2 diabetes mellitus with hyperglycemia: Secondary | ICD-10-CM

## 2021-11-24 LAB — POCT GLYCOSYLATED HEMOGLOBIN (HGB A1C): Hemoglobin A1C: 7 % — AB (ref 4.0–5.6)

## 2021-11-24 MED ORDER — METFORMIN HCL 500 MG PO TABS
500.0000 mg | ORAL_TABLET | Freq: Two times a day (BID) | ORAL | 1 refills | Status: DC
Start: 2021-11-24 — End: 2022-04-06

## 2021-11-24 MED ORDER — SEMAGLUTIDE (1 MG/DOSE) 4 MG/3ML ~~LOC~~ SOPN: 1.0000 mg | PEN_INJECTOR | SUBCUTANEOUS | 3 refills | Status: DC

## 2021-11-24 NOTE — Assessment & Plan Note (Addendum)
Managed with metformin 500 mg bid and ozempic 1 mg weekly. a1c today 7.0%. advised we continue with our current regimen and f/u in 4 mo to see stability . Advised goal is < 7.0% On statin  uacr utd, optho utd  foot exam today

## 2021-11-24 NOTE — Progress Notes (Signed)
I,[name],acting as a Education administrator for [employer], PA-C.,have documented all relevant documentation on the behalf of Mikey Kirschner, PA-C,as directed by  Mikey Kirschner, PA-C while in the presence of Mikey Kirschner, PA-C.   Established patient visit   Patient: Bethany Edwards   DOB: [birth year]   41 y.o. Female  MRN: [id number] Visit Date: 11/24/2021  Today's healthcare provider: Mikey Kirschner, PA-C   Cc. DM II f/u  Subjective    HPI  Diabetes Mellitus Type II, Follow-up  Lab Results  Component Value Date   HGBA1C 7.0 (A) 11/24/2021   HGBA1C 9.7 (H) 09/01/2021   HGBA1C 7.3 (H) 06/01/2021   Wt Readings from Last 3 Encounters:  11/24/21 149 lb 14.4 oz (68 kg)  10/20/21 153 lb 8 oz (69.6 kg)  09/01/21 159 lb (72.1 kg)   Last seen for diabetes 3 months ago.  Management since then includes rx ozempic 0.25 mg weekly x 4 weeks, increase to 0.5 mg weekly x 4 weeks.Continue metformin and decrease to 500 mg TID after starting ozempic. She reports excellent compliance with treatment. She is not having side effects.    Home blood sugar records: fasting range: 120's-170's  Episodes of hypoglycemia? No    Current insulin regiment: none Most Recent Eye Exam: May 23 Current exercise: treadmill Current diet habits: well balanced  Pertinent Labs: Lab Results  Component Value Date   CHOL 124 09/01/2021   HDL 33 (L) 09/01/2021   LDLCALC 54 09/01/2021   TRIG 231 (H) 09/01/2021   CHOLHDL 3.8 09/01/2021   Lab Results  Component Value Date   NA 142 06/01/2021   K 4.2 06/01/2021   CREATININE 0.68 06/01/2021   EGFR 111 06/01/2021   LABMICR <3.0 06/01/2021     ---------------------------------------------------------------------------------------------------   Medications: Outpatient Medications Prior to Visit  Medication Sig   fluconazole (DIFLUCAN) 150 MG tablet Repeat in 3 days if needed   ibuprofen (ADVIL) 800 MG tablet TAKE 1 TABLET(800 MG) BY MOUTH EVERY 8  HOURS AS NEEDED FOR CRAMPING   LINZESS 290 MCG CAPS capsule TAKE 1 CAPSULE(290 MCG) BY MOUTH DAILY BEFORE AND BREAKFAST   nystatin-triamcinolone (MYCOLOG II) cream Apply 1 application topically 2 (two) times daily.   ondansetron (ZOFRAN) 4 MG tablet Take 1 tablet (4 mg total) by mouth every 8 (eight) hours as needed for nausea or vomiting.   rosuvastatin (CRESTOR) 10 MG tablet Take 1 tablet (10 mg total) by mouth daily.   Vitamin D, Ergocalciferol, (DRISDOL) 1.25 MG (50000 UNIT) CAPS capsule Take 1 capsule (50,000 Units total) by mouth every 7 (seven) days.   [DISCONTINUED] metFORMIN (GLUCOPHAGE) 500 MG tablet Take 2 tablets (1,000 mg total) by mouth 2 (two) times daily with a meal.   [DISCONTINUED] Semaglutide, 1 MG/DOSE, 4 MG/3ML SOPN Inject 1 mg into the skin once a week.   No facility-administered medications prior to visit.    Review of Systems  Constitutional:  Negative for fatigue and fever.  Respiratory:  Negative for cough and shortness of breath.   Cardiovascular:  Negative for chest pain and leg swelling.  Gastrointestinal:  Negative for abdominal pain.  Neurological:  Negative for dizziness and headaches.       Objective    Blood pressure 113/70, pulse 97, height 5' 3"  (1.6 m), weight 149 lb 14.4 oz (68 kg), SpO2 100 %.   Physical Exam Vitals reviewed.  Constitutional:      Appearance: She is not ill-appearing.  HENT:     Head:  Normocephalic.  Eyes:     Conjunctiva/sclera: Conjunctivae normal.  Cardiovascular:     Rate and Rhythm: Normal rate.     Pulses:          Dorsalis pedis pulses are 3+ on the right side and 3+ on the left side.  Pulmonary:     Effort: Pulmonary effort is normal. No respiratory distress.  Feet:     Right foot:     Protective Sensation: 4 sites tested.  4 sites sensed.     Left foot:     Protective Sensation: 4 sites tested.  4 sites sensed.  Neurological:     General: No focal deficit present.     Mental Status: She is alert and  oriented to person, place, and time.  Psychiatric:        Mood and Affect: Mood normal.        Behavior: Behavior normal.      Results for orders placed or performed in visit on 11/24/21  POCT HgB A1C  Result Value Ref Range   Hemoglobin A1C 7.0 (A) 4.0 - 5.6 %   HbA1c POC (<> result, manual entry)     HbA1c, POC (prediabetic range)     HbA1c, POC (controlled diabetic range)      Assessment & Plan     Problem List Items Addressed This Visit       Endocrine   Type 2 diabetes mellitus with hyperglycemia, without long-term current use of insulin (HCC) - Primary    Managed with metformin 500 mg bid and ozempic 1 mg weekly. a1c today 7.0%. advised we continue with our current regimen and f/u in 4 mo to see stability . Advised goal is < 7.0% On statin  uacr utd, optho utd  foot exam today       Relevant Medications   metFORMIN (GLUCOPHAGE) 500 MG tablet   Semaglutide, 1 MG/DOSE, 4 MG/3ML SOPN   Other Relevant Orders   POCT HgB A1C (Completed)     Return in about 4 months (around 03/26/2022) for DMII.      I, Mikey Kirschner, PA-C have reviewed all documentation for this visit. The documentation on  11/24/2021   for the exam, diagnosis, procedures, and orders are all accurate and complete.  Mikey Kirschner, PA-C [facility] [address] [place], [place], [address] Office: [phone] Fax: [place]

## 2021-12-02 ENCOUNTER — Encounter: Payer: Self-pay | Admitting: Physician Assistant

## 2021-12-03 ENCOUNTER — Encounter: Payer: Self-pay | Admitting: Physician Assistant

## 2021-12-03 ENCOUNTER — Other Ambulatory Visit: Payer: Self-pay | Admitting: Physician Assistant

## 2021-12-07 ENCOUNTER — Other Ambulatory Visit: Payer: Self-pay | Admitting: Physician Assistant

## 2021-12-07 DIAGNOSIS — E1165 Type 2 diabetes mellitus with hyperglycemia: Secondary | ICD-10-CM

## 2021-12-07 MED ORDER — SEMAGLUTIDE (1 MG/DOSE) 4 MG/3ML ~~LOC~~ SOPN: 1.0000 mg | PEN_INJECTOR | SUBCUTANEOUS | 3 refills | Status: DC

## 2022-01-07 ENCOUNTER — Other Ambulatory Visit: Payer: Self-pay | Admitting: Family Medicine

## 2022-01-07 ENCOUNTER — Other Ambulatory Visit: Payer: Self-pay | Admitting: Physician Assistant

## 2022-01-07 ENCOUNTER — Encounter: Payer: Self-pay | Admitting: Physician Assistant

## 2022-01-07 DIAGNOSIS — L02215 Cutaneous abscess of perineum: Secondary | ICD-10-CM

## 2022-01-07 DIAGNOSIS — B379 Candidiasis, unspecified: Secondary | ICD-10-CM

## 2022-01-07 MED ORDER — FLUCONAZOLE 150 MG PO TABS: 150.0000 mg | ORAL_TABLET | Freq: Once | ORAL | 0 refills | Status: AC

## 2022-03-03 ENCOUNTER — Telehealth: Payer: Commercial Managed Care - PPO | Admitting: Physician Assistant

## 2022-03-03 DIAGNOSIS — U071 COVID-19: Secondary | ICD-10-CM

## 2022-03-03 MED ORDER — PROMETHAZINE-DM 6.25-15 MG/5ML PO SYRP: 5.0000 mL | ORAL_SOLUTION | Freq: Four times a day (QID) | ORAL | 0 refills | Status: DC | PRN

## 2022-03-03 MED ORDER — NIRMATRELVIR/RITONAVIR (PAXLOVID)TABLET: 3.0000 | ORAL_TABLET | Freq: Two times a day (BID) | ORAL | 0 refills | Status: AC

## 2022-03-03 NOTE — Patient Instructions (Addendum)
Jenel Lucks, thank you for joining Piedad Climes, PA-C for today's virtual visit.  While this provider is not your primary care provider (PCP), if your PCP is located in our provider database this encounter information will be shared with them immediately following your visit.   A [facility] MyChart account gives you access to today's visit and all your visits, tests, and labs performed at [facility] " click here if you don't have a [facility] MyChart account or go to [email or web address]  Consent: (Patient) Bethany Edwards provided verbal consent for this virtual visit at the beginning of the encounter.  Current Medications:  Current Outpatient Medications:    ibuprofen (ADVIL) 800 MG tablet, TAKE 1 TABLET(800 MG) BY MOUTH EVERY 8 HOURS AS NEEDED FOR CRAMPING, Disp: 30 tablet, Rfl: 1   LINZESS 290 MCG CAPS capsule, TAKE 1 CAPSULE(290 MCG) BY MOUTH DAILY BEFORE AND BREAKFAST, Disp: 90 capsule, Rfl: 0   metFORMIN (GLUCOPHAGE) 500 MG tablet, Take 1 tablet (500 mg total) by mouth 2 (two) times daily with a meal., Disp: 360 tablet, Rfl: 1   nystatin-triamcinolone (MYCOLOG II) cream, Apply 1 application topically 2 (two) times daily., Disp: 30 g, Rfl: 1   ondansetron (ZOFRAN) 4 MG tablet, Take 1 tablet (4 mg total) by mouth every 8 (eight) hours as needed for nausea or vomiting., Disp: 20 tablet, Rfl: 0   rosuvastatin (CRESTOR) 10 MG tablet, Take 1 tablet (10 mg total) by mouth daily., Disp: 90 tablet, Rfl: 3   Semaglutide, 1 MG/DOSE, 4 MG/3ML SOPN, Inject 1 mg into the skin once a week., Disp: 3 mL, Rfl: 3   Vitamin D, Ergocalciferol, (DRISDOL) 1.25 MG (50000 UNIT) CAPS capsule, Take 1 capsule (50,000 Units total) by mouth every 7 (seven) days., Disp: 13 capsule, Rfl: 1   Medications ordered in this encounter:  No orders of the defined types were placed in this encounter.    *If you need refills on other medications prior to your next appointment, please contact your  pharmacy*  Follow-Up: Call back or seek an in-person evaluation if the symptoms worsen or if the condition fails to improve as anticipated.  [facility] [phone]  Other Instructions Please keep well-hydrated and get plenty of rest. Start a saline nasal rinse to flush out your nasal passages. You can use plain Mucinex to help thin congestion. If you have a humidifier, running in the bedroom at night. I want you to start OTC vitamin D3 1000 units daily, vitamin C 1000 mg daily, and a zinc supplement. Please take prescribed medications as directed.  You have been enrolled in a MyChart symptom monitoring program. Please answer these questions daily so we can keep track of how you are doing.  You were to quarantine for 5 days from onset of your symptoms.  After day 5, if you have had no fever and you are feeling better, you can end quarantine but need to mask for an additional 5 days. After day 5 if you have a fever or are having significant symptoms, please quarantine for full 10 days.  If you note any worsening of symptoms, any significant shortness of breath or any chest pain, please seek ER evaluation ASAP.  Please do not delay care!  COVID-19: What to Do if You Are Sick If you test positive and are an older adult or someone who is at high risk of getting very sick from COVID-19, treatment may be available. Contact a healthcare provider right away  after a positive test to determine if you are eligible, even if your symptoms are mild right now. You can also visit a Test to Treat location and, if eligible, receive a prescription from a provider. Don't delay: Treatment must be started within the first few days to be effective. If you have a fever, cough, or other symptoms, you might have COVID-19. Most people have mild illness and are able to recover at home. If you are sick: Keep track of your symptoms. If you have an emergency warning sign (including trouble breathing),  call 911. Steps to help prevent the spread of COVID-19 if you are sick If you are sick with COVID-19 or think you might have COVID-19, follow the steps below to care for yourself and to help protect other people in your home and community. Stay home except to get medical care Stay home. Most people with COVID-19 have mild illness and can recover at home without medical care. Do not leave your home, except to get medical care. Do not visit public areas and do not go to places where you are unable to wear a mask. Take care of yourself. Get rest and stay hydrated. Take over-the-counter medicines, such as acetaminophen, to help you feel better. Stay in touch with your doctor. Call before you get medical care. Be sure to get care if you have trouble breathing, or have any other emergency warning signs, or if you think it is an emergency. Avoid public transportation, ride-sharing, or taxis if possible. Get tested If you have symptoms of COVID-19, get tested. While waiting for test results, stay away from others, including staying apart from those living in your household. Get tested as soon as possible after your symptoms start. Treatments may be available for people with COVID-19 who are at risk for becoming very sick. Don't delay: Treatment must be started early to be effective--some treatments must begin within 5 days of your first symptoms. Contact your healthcare provider right away if your test result is positive to determine if you are eligible. Self-tests are one of several options for testing for the virus that causes COVID-19 and may be more convenient than laboratory-based tests and point-of-care tests. Ask your healthcare provider or your local health department if you need help interpreting your test results. You can visit your state, tribal, local, and territorial health department's website to look for the latest local information on testing sites. Separate yourself from other people As much  as possible, stay in a specific room and away from other people and pets in your home. If possible, you should use a separate bathroom. If you need to be around other people or animals in or outside of the home, wear a well-fitting mask. Tell your close contacts that they may have been exposed to COVID-19. An infected person can spread COVID-19 starting 48 hours (or 2 days) before the person has any symptoms or tests positive. By letting your close contacts know they may have been exposed to COVID-19, you are helping to protect everyone. See COVID-19 and Animals if you have questions about pets. If you are diagnosed with COVID-19, someone from the health department may call you. Answer the call to slow the spread. Monitor your symptoms Symptoms of COVID-19 include fever, cough, or other symptoms. Follow care instructions from your healthcare provider and local health department. Your local health authorities may give instructions on checking your symptoms and reporting information. When to seek emergency medical attention Look for emergency warning signs* for COVID-19.  If someone is showing any of these signs, seek emergency medical care immediately: Trouble breathing Persistent pain or pressure in the chest New confusion Inability to wake or stay awake Pale, gray, or blue-colored skin, lips, or nail beds, depending on skin tone *This list is not all possible symptoms. Please call your medical provider for any other symptoms that are severe or concerning to you. Call 911 or call ahead to your local emergency facility: Notify the operator that you are seeking care for someone who has or may have COVID-19. Call ahead before visiting your doctor Call ahead. Many medical visits for routine care are being postponed or done by phone or telemedicine. If you have a medical appointment that cannot be postponed, call your doctor's office, and tell them you have or may have COVID-19. This will help the office  protect themselves and other patients. If you are sick, wear a well-fitting mask You should wear a mask if you must be around other people or animals, including pets (even at home). Wear a mask with the best fit, protection, and comfort for you. You don't need to wear the mask if you are alone. If you can't put on a mask (because of trouble breathing, for example), cover your coughs and sneezes in some other way. Try to stay at least 6 feet away from other people. This will help protect the people around you. Masks should not be placed on young children under age 49 years, anyone who has trouble breathing, or anyone who is not able to remove the mask without help. Cover your coughs and sneezes Cover your mouth and nose with a tissue when you cough or sneeze. Throw away used tissues in a lined trash can. Immediately wash your hands with soap and water for at least 20 seconds. If soap and water are not available, clean your hands with an alcohol-based hand sanitizer that contains at least 60% alcohol. Clean your hands often Wash your hands often with soap and water for at least 20 seconds. This is especially important after blowing your nose, coughing, or sneezing; going to the bathroom; and before eating or preparing food. Use hand sanitizer if soap and water are not available. Use an alcohol-based hand sanitizer with at least 60% alcohol, covering all surfaces of your hands and rubbing them together until they feel dry. Soap and water are the best option, especially if hands are visibly dirty. Avoid touching your eyes, nose, and mouth with unwashed hands. Handwashing Tips Avoid sharing personal household items Do not share dishes, drinking glasses, cups, eating utensils, towels, or bedding with other people in your home. Wash these items thoroughly after using them with soap and water or put in the dishwasher. Clean surfaces in your home regularly Clean and disinfect high-touch surfaces (for  example, doorknobs, tables, handles, light switches, and countertops) in your "sick room" and bathroom. In shared spaces, you should clean and disinfect surfaces and items after each use by the person who is ill. If you are sick and cannot clean, a caregiver or other person should only clean and disinfect the area around you (such as your bedroom and bathroom) on an as needed basis. Your caregiver/other person should wait as long as possible (at least several hours) and wear a mask before entering, cleaning, and disinfecting shared spaces that you use. Clean and disinfect areas that may have blood, stool, or body fluids on them. Use household cleaners and disinfectants. Clean visible dirty surfaces with household cleaners containing soap  or detergent. Then, use a household disinfectant. Use a product from [employer]'s List N: Disinfectants for Coronavirus (COVID-19). Be sure to follow the instructions on the label to ensure safe and effective use of the product. Many products recommend keeping the surface wet with a disinfectant for a certain period of time (look at "contact time" on the product label). You may also need to wear personal protective equipment, such as gloves, depending on the directions on the product label. Immediately after disinfecting, wash your hands with soap and water for 20 seconds. For completed guidance on cleaning and disinfecting your home, visit Complete Disinfection Guidance. Take steps to improve ventilation at home Improve ventilation (air flow) at home to help prevent from spreading COVID-19 to other people in your household. Clear out COVID-19 virus particles in the air by opening windows, using air filters, and turning on fans in your home. Use this interactive tool to learn how to improve air flow in your home. When you can be around others after being sick with COVID-19 Deciding when you can be around others is different for different situations. Find out when you can  safely end home isolation. For any additional questions about your care, contact your healthcare provider or state or local health department. 07/07/2020 Content source: [facility] Center for Immunization and Respiratory Diseases (NCIRD), Division of Viral Diseases This information is not intended to replace advice given to you by your health care provider. Make sure you discuss any questions you have with your health care provider. Document Revised: 08/20/2020 Document Reviewed: 08/20/2020 Elsevier Patient Education  2022 [employer]   If you have been instructed to have an in-person evaluation today at a local Urgent Care facility, please use the link below. It will take you to a list of all of our available [facility], including address, phone number and hours of operation. Please do not delay care.  [facility]  If you or a family member do not have a primary care provider, use the link below to schedule a visit and establish care. When you choose a [facility] primary care physician or advanced practice provider, you gain a long-term partner in health. Find a Primary Care Provider  Learn more about [facility] in-office and virtual care options: [facility] Get [facility] Now

## 2022-03-03 NOTE — Progress Notes (Signed)
Virtual Visit Consent   Bethany Edwards, you are scheduled for a virtual visit with a [facility] provider today. Just as with appointments in the office, your consent must be obtained to participate. Your consent will be active for this visit and any virtual visit you may have with one of our providers in the next 365 days. If you have a MyChart account, a copy of this consent can be sent to you electronically.  As this is a virtual visit, video technology does not allow for your provider to perform a traditional examination. This may limit your provider's ability to fully assess your condition. If your provider identifies any concerns that need to be evaluated in person or the need to arrange testing (such as labs, EKG, etc.), we will make arrangements to do so. Although advances in technology are sophisticated, we cannot ensure that it will always work on either your end or our end. If the connection with a video visit is poor, the visit may have to be switched to a telephone visit. With either a video or telephone visit, we are not always able to ensure that we have a secure connection.  By engaging in this virtual visit, you consent to the provision of healthcare and authorize for your insurance to be billed (if applicable) for the services provided during this visit. Depending on your insurance coverage, you may receive a charge related to this service.  I need to obtain your verbal consent now. Are you willing to proceed with your visit today? Bethany Edwards has provided verbal consent on 03/03/2022 for a virtual visit (video or telephone). Piedad Climes, [place]  Date: 03/03/2022 1:17 PM  Virtual Visit via Video Note   I, Piedad Climes, connected with  Bethany Edwards  ([id number], 27-Jun-1980) on 03/03/22 at  1:15 PM EST by a video-enabled telemedicine application and verified that I am speaking with the correct person using two identifiers.  Location: Patient: Virtual Visit Location  Patient: Home Provider: Virtual Visit Location Provider: Home Office   I discussed the limitations of evaluation and management by telemedicine and the availability of in person appointments. The patient expressed understanding and agreed to proceed.    History of Present Illness: Bethany Edwards is a 41 y.o. who identifies as a female who was assigned female at birth, and is being seen today for COVID-19. Notes symptoms starting Monday night into Tuesday. Tested positive on Tuesday at work [employer] at [employer]). Chest congestion, tightness, ear pressure/popping, head congestion with sinus pressure and voice changes. Also noting fatigue. Taking Tylenol, Ibuprofen, Mucinex which help somewhat.   [facility] Tuesday.  Also full body rash that she had last go round with COVID-19.   HPI: HPI  Problems:  Patient Active Problem List   Diagnosis Date Noted   Right foot pain 10/20/2021   Hypertension associated with diabetes (HCC) 06/01/2021   Encounter for hepatitis C screening test for low risk patient 06/01/2021   Avitaminosis D 06/01/2021   Hyperlipidemia associated with type 2 diabetes mellitus (HCC) 11/17/2020   Menorrhagia with regular cycle    Chronic idiopathic constipation 06/16/2017   Overweight 12/02/2015   Cutaneous skin tags 02/10/2015   Abscess 11/13/2014   Allergic rhinitis 09/11/2014   Absolute anemia 09/11/2014   Anxiety 09/11/2014   Type 2 diabetes mellitus with hyperglycemia, without long-term current use of insulin (HCC) 09/11/2014   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    Medications:  Current Outpatient Medications:    nirmatrelvir/ritonavir EUA (PAXLOVID) 20 x 150 MG & 10 x 100MG  TABS, Take 3 tablets by mouth 2 (two) times daily for 5 days. (Take nirmatrelvir 150 mg two tablets twice daily for 5 days and ritonavir 100 mg one tablet twice daily for 5 days) Patient GFR is > 60, Disp: 30 tablet, Rfl: 0   promethazine-dextromethorphan (PROMETHAZINE-DM) 6.25-15 MG/5ML syrup, Take 5 mLs by mouth 4 (four) times daily as needed for cough., Disp: 118 mL, Rfl: 0   ibuprofen (ADVIL) 800 MG tablet, TAKE 1 TABLET(800 MG) BY MOUTH EVERY 8 HOURS AS NEEDED FOR CRAMPING, Disp: 30 tablet, Rfl: 1   LINZESS 290 MCG CAPS capsule, TAKE 1 CAPSULE(290 MCG) BY MOUTH DAILY BEFORE AND BREAKFAST, Disp: 90 capsule, Rfl: 0   metFORMIN (GLUCOPHAGE) 500 MG tablet, Take 1 tablet (500 mg total) by mouth 2 (two) times daily with a meal., Disp: 360 tablet, Rfl: 1   nystatin-triamcinolone (MYCOLOG II) cream, Apply 1 application topically 2 (two) times daily., Disp: 30 g, Rfl: 1   ondansetron (ZOFRAN) 4 MG tablet, Take 1 tablet (4 mg total) by mouth every 8 (eight) hours as needed for nausea or vomiting., Disp: 20 tablet, Rfl: 0   rosuvastatin (CRESTOR) 10 MG tablet, Take 1 tablet (10 mg total) by mouth daily., Disp: 90 tablet, Rfl: 3   Semaglutide, 1 MG/DOSE, 4 MG/3ML SOPN, Inject 1 mg into the skin once a week., Disp: 3 mL, Rfl: 3   Vitamin D, Ergocalciferol, (DRISDOL) 1.25 MG (50000 UNIT) CAPS capsule, Take 1 capsule (50,000 Units total) by mouth every 7 (seven) days., Disp: 13 capsule, Rfl: 1  Observations/Objective: Patient is well-developed, well-nourished in no acute distress.  Resting comfortably at home.  Head is normocephalic, atraumatic.  No labored breathing. Speech is clear and coherent with logical content.   Patient is alert and oriented at baseline.   Assessment and Plan: 1. COVID-19 - MyChart COVID-19 home monitoring program; Future - promethazine-dextromethorphan (PROMETHAZINE-DM) 6.25-15 MG/5ML syrup; Take 5 mLs by mouth 4 (four) times daily as needed for cough.  Dispense: 118 mL; Refill: 0 - nirmatrelvir/ritonavir EUA (PAXLOVID) 20 x 150 MG & 10 x 100MG  TABS; Take 3 tablets by mouth 2 (two) times daily for 5 days. (Take nirmatrelvir 150 mg two tablets twice daily for 5 days and ritonavir 100 mg one tablet twice daily for 5 days) Patient GFR is > 60  Dispense: 30 tablet; Refill: 0  Patient with multiple risk factors for complicated course of illness. Discussed risks/benefits of antiviral medications including most common potential ADRs. Patient voiced understanding and would like to proceed with antiviral medication. They are candidate for Paxlovid. Rx sent to pharmacy. Supportive measures, OTC medications and vitamin regimen reviewed. Promethazine-Dm per orders. Patient has been enrolled in a MyChart COVID symptom monitoring program. 06-20-1990 reviewed in detail. Strict ER precautions discussed with patient.    Follow Up Instructions: I discussed the assessment and treatment plan with the patient. The patient  was provided an opportunity to ask questions and all were answered. The patient agreed with the plan and demonstrated an understanding of the instructions.  A copy of instructions were sent to the patient via MyChart unless otherwise noted below.   The patient was advised to call back or seek an in-person evaluation if the symptoms worsen or if the condition fails to improve as anticipated.  Time:  I spent 10 minutes with the patient via telehealth technology discussing the above problems/concerns.    [name], PA-C

## 2022-03-09 ENCOUNTER — Ambulatory Visit: Payer: Commercial Managed Care - PPO | Admitting: Physician Assistant

## 2022-03-22 ENCOUNTER — Encounter: Payer: Self-pay | Admitting: Physician Assistant

## 2022-03-23 ENCOUNTER — Other Ambulatory Visit: Payer: Self-pay | Admitting: Physician Assistant

## 2022-03-23 DIAGNOSIS — E1165 Type 2 diabetes mellitus with hyperglycemia: Secondary | ICD-10-CM

## 2022-03-23 MED ORDER — SEMAGLUTIDE (1 MG/DOSE) 4 MG/3ML ~~LOC~~ SOPN: 1.0000 mg | PEN_INJECTOR | SUBCUTANEOUS | 3 refills | Status: DC

## 2022-04-03 IMAGING — MG DIGITAL SCREENING BILAT W/ TOMO W/ CAD
6 of 10 series · 6 of 30 positions shown · non-contrast
Comparison: None.

CLINICAL DATA: Screening.

EXAM:
DIGITAL SCREENING BILATERAL MAMMOGRAM WITH TOMO AND CAD

[L MLO synth-2D]
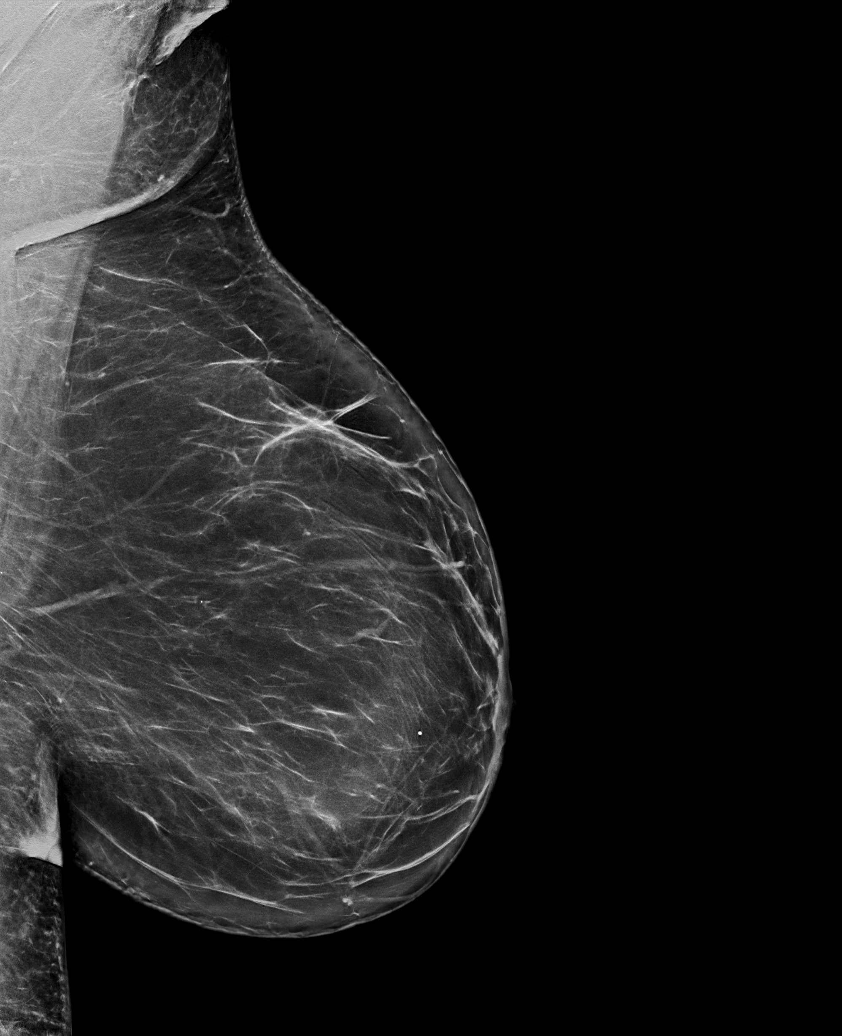

[R MLO synth-2D (1 of 2)]
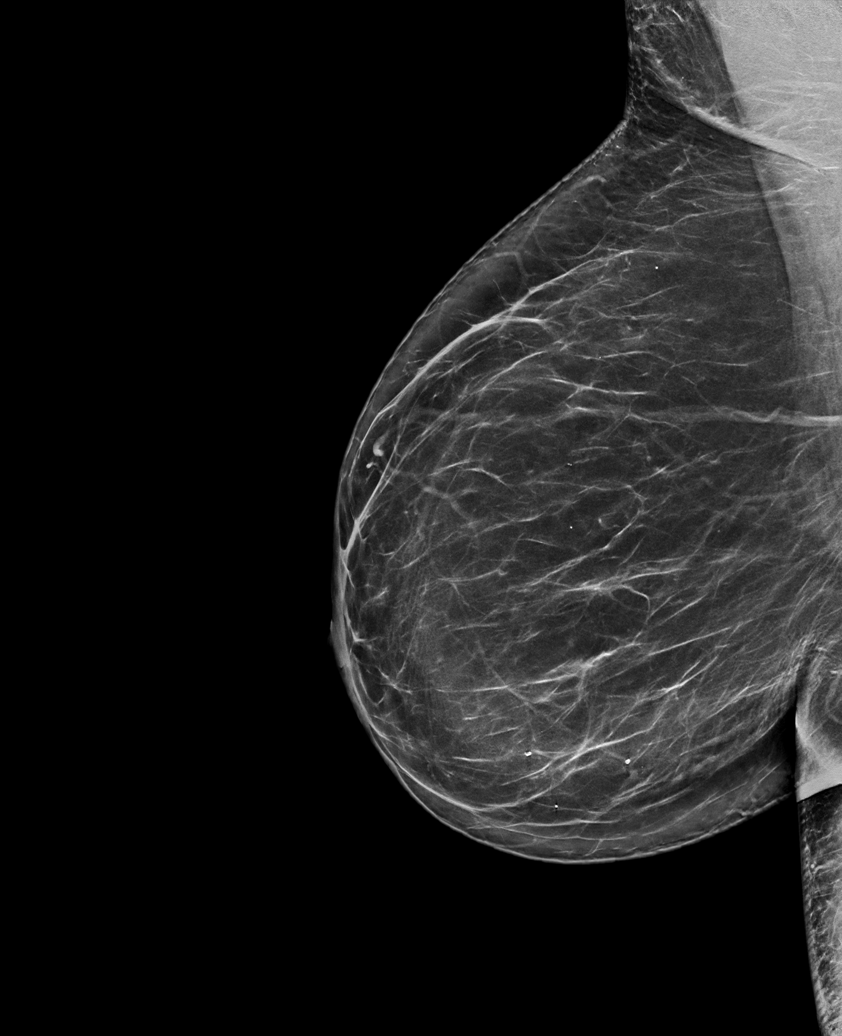

[L CC synth-2D]
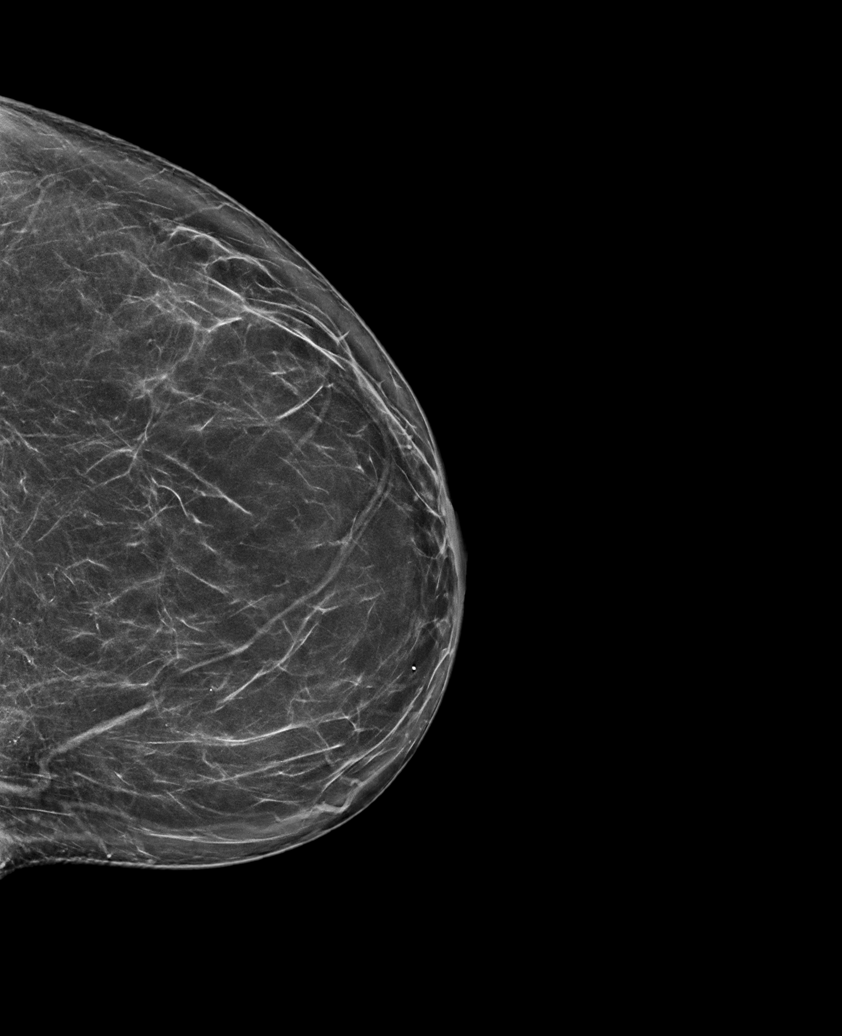

[R MLO synth-2D (2 of 2)]
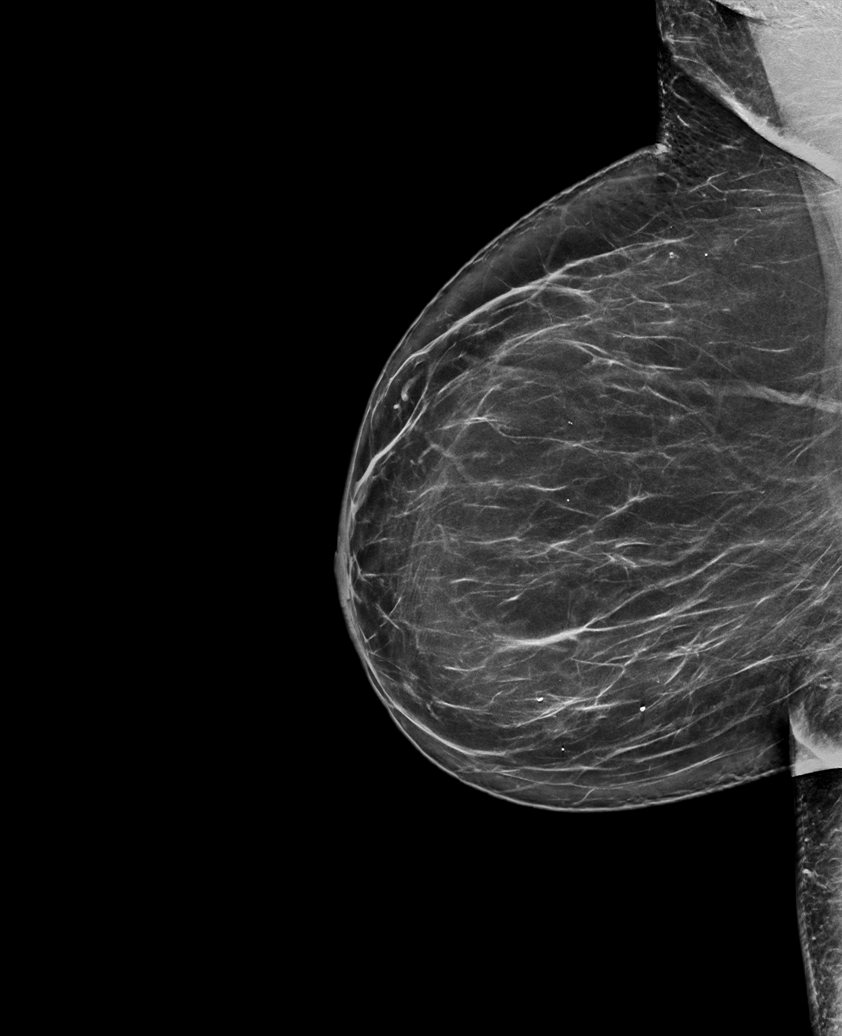

[R CC synth-2D]
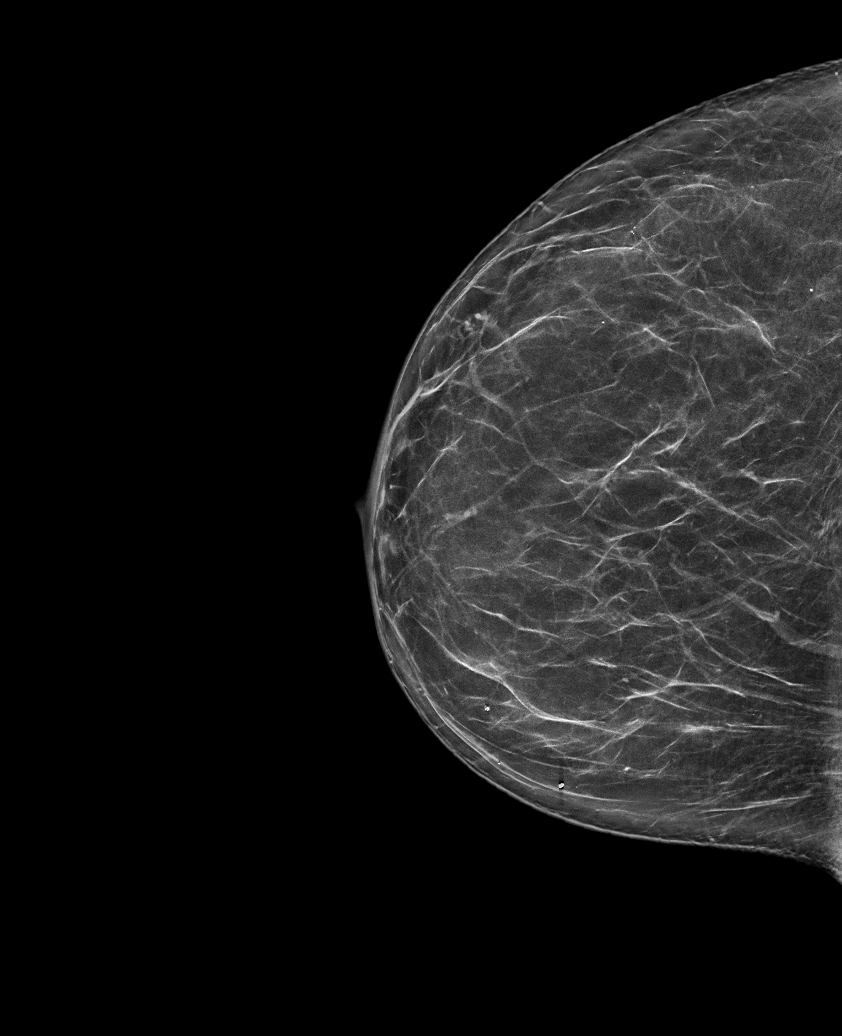

[R MLO tomo · tomo slice 40/79.0]
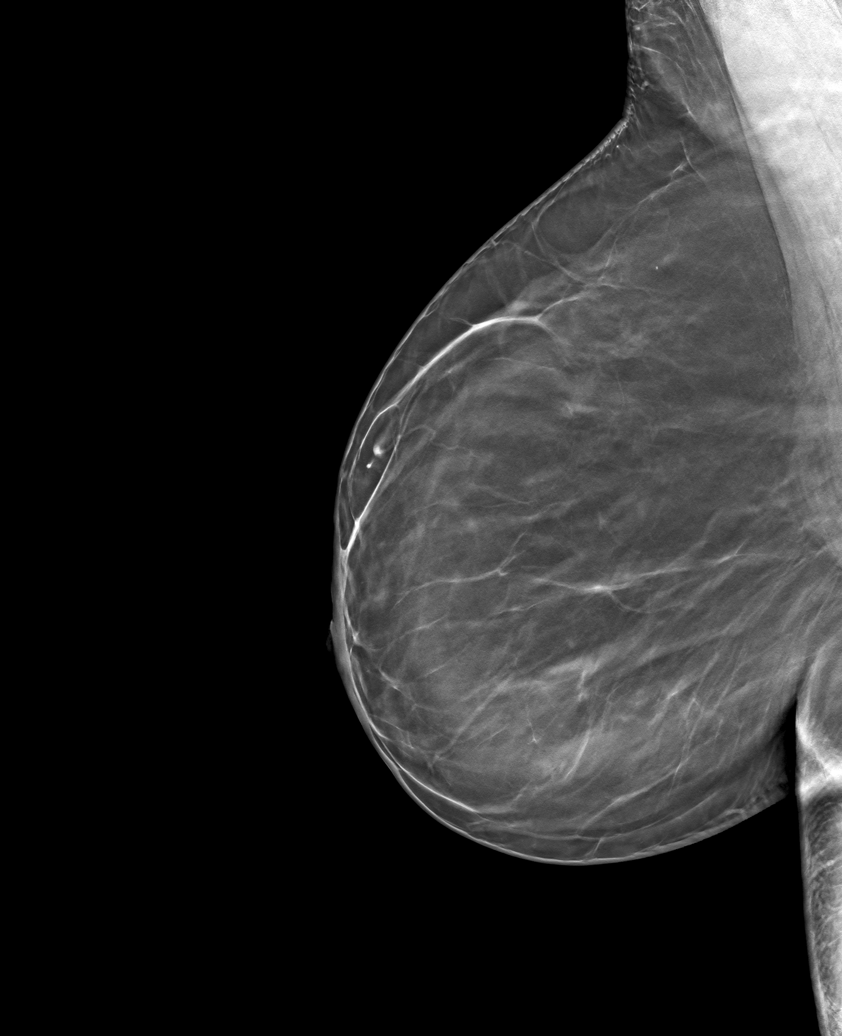

[6 of 30 positions shown; findings below may reference images not displayed]

ACR Breast Density Category b: There are scattered areas of
fibroglandular density.
FINDINGS: There are no findings suspicious for malignancy. Images were
processed with CAD.
IMPRESSION: No mammographic evidence of malignancy. A result letter of this
screening mammogram will be mailed directly to the patient.

RECOMMENDATION:
Screening mammogram in one year. (Code:Y5-G-EJ6)

BI-RADS CATEGORY  1: Negative.

## 2022-04-06 ENCOUNTER — Ambulatory Visit (INDEPENDENT_AMBULATORY_CARE_PROVIDER_SITE_OTHER): Payer: Commercial Managed Care - PPO | Admitting: Physician Assistant

## 2022-04-06 ENCOUNTER — Encounter: Payer: Self-pay | Admitting: Physician Assistant

## 2022-04-06 VITALS — BP 125/79 | HR 81 | Wt 147.0 lb

## 2022-04-06 DIAGNOSIS — Z1231 Encounter for screening mammogram for malignant neoplasm of breast: Secondary | ICD-10-CM

## 2022-04-06 DIAGNOSIS — E559 Vitamin D deficiency, unspecified: Secondary | ICD-10-CM

## 2022-04-06 DIAGNOSIS — G4700 Insomnia, unspecified: Secondary | ICD-10-CM

## 2022-04-06 DIAGNOSIS — E1165 Type 2 diabetes mellitus with hyperglycemia: Secondary | ICD-10-CM | POA: Diagnosis not present

## 2022-04-06 LAB — POCT GLYCOSYLATED HEMOGLOBIN (HGB A1C): Hemoglobin A1C: 6.8 % — AB (ref 4.0–5.6)

## 2022-04-06 MED ORDER — METFORMIN HCL ER 750 MG PO TB24: 750.0000 mg | ORAL_TABLET | Freq: Every day | ORAL | 1 refills | Status: DC

## 2022-04-06 MED ORDER — SEMAGLUTIDE (1 MG/DOSE) 4 MG/3ML ~~LOC~~ SOPN: 1.0000 mg | PEN_INJECTOR | SUBCUTANEOUS | 1 refills | Status: DC

## 2022-04-06 MED ORDER — TRAZODONE HCL 50 MG PO TABS: 25.0000 mg | ORAL_TABLET | Freq: Every evening | ORAL | 3 refills | Status: DC | PRN

## 2022-04-06 NOTE — Assessment & Plan Note (Deleted)
66 

## 2022-04-06 NOTE — Assessment & Plan Note (Signed)
Managed with metformin 500 mg bid -- pt admits to missing most of the time. If she remembers she takes one daily And ozempic 1 mg weekly A1c today 6/6% previously 7.0% Advised switching metformin 500 mg bid dosing to 750 mg XR in AM to help with adherance.  On statin Uacr utd, optho utd, foot exam utd F/u 6 mo

## 2022-04-06 NOTE — Assessment & Plan Note (Signed)
Advised only using 2 mg - 5 mg dosing of melatonin Advised sleep hygiene Can try trazodone 25 -50 mg prn qhs

## 2022-04-06 NOTE — Assessment & Plan Note (Signed)
Need to recheck after rx dosing. She is not currently taking a supplement

## 2022-04-06 NOTE — Progress Notes (Signed)
I,[name],acting as a Education administrator for [employer], PA-C.,have documented all relevant documentation on the behalf of Mikey Kirschner, PA-C,as directed by  Mikey Kirschner, PA-C while in the presence of Mikey Kirschner, PA-C.   Established patient visit   Patient: Bethany Edwards   DOB: [birth year]   41 y.o. Female  MRN: [id number] Visit Date: 04/06/2022  Today's healthcare provider: Mikey Kirschner, PA-C   Cc. DM II f/u, insomnia  Subjective    HPI  Insomnia  Pt reports for the last three months having more difficulty falling asleep, reports her mind races and she often lays in the dark. She has taken melatonin, up to 10 mg with little improvement. Denies anxiety or depressive feelings during the day.  Diabetes Mellitus Type II, Follow-up  Lab Results  Component Value Date   HGBA1C 6.8 (A) 04/06/2022   HGBA1C 7.0 (A) 11/24/2021   HGBA1C 9.7 (H) 09/01/2021   Wt Readings from Last 3 Encounters:  04/06/22 147 lb (66.7 kg)  11/24/21 149 lb 14.4 oz (68 kg)  10/20/21 153 lb 8 oz (69.6 kg)   Last seen for diabetes 4 months ago.  Management since then includes metformin 500 mg bid and ozempic 1 mg weekly .  Home blood sugar records:  on occasions  Episodes of hypoglycemia? No    Current insulin regiment: none Most Recent Eye Exam: 09/15/21  Pertinent Labs: Lab Results  Component Value Date   CHOL 124 09/01/2021   HDL 33 (L) 09/01/2021   LDLCALC 54 09/01/2021   TRIG 231 (H) 09/01/2021   CHOLHDL 3.8 09/01/2021   Lab Results  Component Value Date   NA 142 06/01/2021   K 4.2 06/01/2021   CREATININE 0.68 06/01/2021   EGFR 111 06/01/2021   LABMICR <3.0 06/01/2021     ---------------------------------------------------------------------------------------------------   Medications: Outpatient Medications Prior to Visit  Medication Sig   ibuprofen (ADVIL) 800 MG tablet TAKE 1 TABLET(800 MG) BY MOUTH EVERY 8 HOURS AS NEEDED FOR CRAMPING   LINZESS 290 MCG CAPS capsule  TAKE 1 CAPSULE(290 MCG) BY MOUTH DAILY BEFORE AND BREAKFAST   nystatin-triamcinolone (MYCOLOG II) cream Apply 1 application topically 2 (two) times daily.   ondansetron (ZOFRAN) 4 MG tablet Take 1 tablet (4 mg total) by mouth every 8 (eight) hours as needed for nausea or vomiting.   rosuvastatin (CRESTOR) 10 MG tablet Take 1 tablet (10 mg total) by mouth daily.   [DISCONTINUED] metFORMIN (GLUCOPHAGE) 500 MG tablet Take 1 tablet (500 mg total) by mouth 2 (two) times daily with a meal.   [DISCONTINUED] Semaglutide, 1 MG/DOSE, 4 MG/3ML SOPN Inject 1 mg into the skin once a week.   Vitamin D, Ergocalciferol, (DRISDOL) 1.25 MG (50000 UNIT) CAPS capsule Take 1 capsule (50,000 Units total) by mouth every 7 (seven) days. (Patient not taking: Reported on 04/06/2022)   [DISCONTINUED] promethazine-dextromethorphan (PROMETHAZINE-DM) 6.25-15 MG/5ML syrup Take 5 mLs by mouth 4 (four) times daily as needed for cough.   No facility-administered medications prior to visit.    Review of Systems  Constitutional:  Negative for fatigue and fever.  Respiratory:  Negative for cough and shortness of breath.   Cardiovascular:  Negative for chest pain and leg swelling.  Gastrointestinal:  Negative for abdominal pain.  Neurological:  Negative for dizziness and headaches.  Psychiatric/Behavioral:  Positive for sleep disturbance.       Objective    Blood pressure 125/79, pulse 81, weight 147 lb (66.7 kg), SpO2 98 %.   Physical Exam  Vitals reviewed.  Constitutional:      Appearance: She is not ill-appearing.  HENT:     Head: Normocephalic.  Eyes:     Conjunctiva/sclera: Conjunctivae normal.  Cardiovascular:     Rate and Rhythm: Normal rate.  Pulmonary:     Effort: Pulmonary effort is normal. No respiratory distress.  Neurological:     General: No focal deficit present.     Mental Status: She is alert and oriented to person, place, and time.  Psychiatric:        Mood and Affect: Mood normal.         Behavior: Behavior normal.      Results for orders placed or performed in visit on 04/06/22  POCT HgB A1C  Result Value Ref Range   Hemoglobin A1C 6.8 (A) 4.0 - 5.6 %   HbA1c POC (<> result, manual entry)     HbA1c, POC (prediabetic range)     HbA1c, POC (controlled diabetic range)      Assessment & Plan     Problem List Items Addressed This Visit       Endocrine   Type 2 diabetes mellitus with hyperglycemia, without long-term current use of insulin ([place]) - Primary    Managed with metformin 500 mg bid -- pt admits to missing most of the time. If she remembers she takes one daily And ozempic 1 mg weekly A1c today 6/6% previously 7.0% Advised switching metformin 500 mg bid dosing to 750 mg XR in AM to help with adherance.  On statin Uacr utd, optho utd, foot exam utd F/u 6 mo       Relevant Medications   metFORMIN (GLUCOPHAGE-XR) 750 MG 24 hr tablet   Semaglutide, 1 MG/DOSE, 4 MG/3ML SOPN   Other Relevant Orders   POCT HgB A1C (Completed)     Other   Avitaminosis D    Need to recheck after rx dosing. She is not currently taking a supplement       Relevant Orders   Vitamin D (25 hydroxy)   Insomnia    Advised only using 2 mg - 5 mg dosing of melatonin Advised sleep hygiene Can try trazodone 25 -50 mg prn qhs       Relevant Medications   traZODone (DESYREL) 50 MG tablet   Other Visit Diagnoses     Breast cancer screening by mammogram       Relevant Orders   MM 3D SCREEN BREAST BILATERAL        Return in about 6 months (around 10/06/2022) for chronic conditions, CPE.      I, Mikey Kirschner, PA-C have reviewed all documentation for this visit. The documentation on 04/06/2022  for the exam, diagnosis, procedures, and orders are all accurate and complete.  Mikey Kirschner, PA-C [facility] [address] [place], [place], [address] Office: [phone] Fax: [place]

## 2022-04-07 ENCOUNTER — Encounter: Payer: Self-pay | Admitting: Physician Assistant

## 2022-04-07 LAB — VITAMIN D 25 HYDROXY (VIT D DEFICIENCY, FRACTURES): Vit D, 25-Hydroxy: 25.7 ng/mL — ABNORMAL LOW (ref 30.0–100.0)

## 2022-05-03 ENCOUNTER — Ambulatory Visit: Admitting: Dermatology

## 2022-05-03 ENCOUNTER — Encounter: Payer: Self-pay | Admitting: Dermatology

## 2022-05-03 VITALS — BP 115/73 | HR 89

## 2022-05-03 DIAGNOSIS — L720 Epidermal cyst: Secondary | ICD-10-CM | POA: Diagnosis not present

## 2022-05-03 DIAGNOSIS — L814 Other melanin hyperpigmentation: Secondary | ICD-10-CM | POA: Diagnosis not present

## 2022-05-03 DIAGNOSIS — L821 Other seborrheic keratosis: Secondary | ICD-10-CM | POA: Diagnosis not present

## 2022-05-03 NOTE — Patient Instructions (Signed)
Wound Care  Wash gently with J&J baby shampoo and water everyday.   Apply Vaseline and Band-Aid daily until healed.   Seborrheic Keratosis  What causes seborrheic keratoses? Seborrheic keratoses are harmless, common skin growths that first appear during adult life.  As time goes by, more growths appear.  Some people may develop a large number of them.  Seborrheic keratoses appear on both covered and uncovered body parts.  They are not caused by sunlight.  The tendency to develop seborrheic keratoses can be inherited.  They vary in color from skin-colored to gray, brown, or even black.  They can be either smooth or have a rough, warty surface.   Seborrheic keratoses are superficial and look as if they were stuck on the skin.  Under the microscope this type of keratosis looks like layers upon layers of skin.  That is why at times the top layer may seem to fall off, but the rest of the growth remains and re-grows.    Treatment Seborrheic keratoses do not need to be treated, but can easily be removed in the office.  Seborrheic keratoses often cause symptoms when they rub on clothing or jewelry.  Lesions can be in the way of shaving.  If they become inflamed, they can cause itching, soreness, or burning.  Removal of a seborrheic keratosis can be accomplished by freezing, burning, or surgery. If any spot bleeds, scabs, or grows rapidly, please return to have it checked, as these can be an indication of a skin cancer.   Due to recent changes in healthcare laws, you may see results of your pathology and/or laboratory studies on MyChart before the doctors have had a chance to review them. We understand that in some cases there may be results that are confusing or concerning to you. Please understand that not all results are received at the same time and often the doctors may need to interpret multiple results in order to provide you with the best plan of care or course of treatment. Therefore, we ask that you  please give [place] 2 business days to thoroughly review all your results before contacting the office for clarification. Should we see a critical lab result, you will be contacted sooner.   If You Need Anything After Your Visit  If you have any questions or concerns for your doctor, please call our main line at [phone] and press option 4 to reach your doctor's medical assistant. If no one answers, please leave a voicemail as directed and we will return your call as soon as possible. Messages left after 4 pm will be answered the following business day.   You may also send [place] a message via [place]. We typically respond to MyChart messages within 1-2 business days.  For prescription refills, please ask your pharmacy to contact our office. Our fax number is [phone].  If you have an urgent issue when the clinic is closed that cannot wait until the next business day, you can page your doctor at the number below.    Please note that while we do our best to be available for urgent issues outside of office hours, we are not available 24/7.   If you have an urgent issue and are unable to reach [place], you may choose to seek medical care at your doctor's office, retail clinic, urgent care center, or emergency room.  If you have a medical emergency, please immediately call 911 or go to the emergency department.  Pager Numbers  - Dr. Nehemiah Massed:  [phone]  - Dr. Laurence Ferrari: [phone]  - Dr. Nicole Kindred: [phone]  In the event of inclement weather, please call our main line at [phone] for an update on the status of any delays or closures.  Dermatology Medication Tips: Please keep the boxes that topical medications come in in order to help keep track of the instructions about where and how to use these. Pharmacies typically print the medication instructions only on the boxes and not directly on the medication tubes.   If your medication is too expensive, please contact our office at  [phone] option 4 or send [place] a message through [place].   We are unable to tell what your co-pay for medications will be in advance as this is different depending on your insurance coverage. However, we may be able to find a substitute medication at lower cost or fill out paperwork to get insurance to cover a needed medication.   If a prior authorization is required to get your medication covered by your insurance company, please allow [place] 1-2 business days to complete this process.  Drug prices often vary depending on where the prescription is filled and some pharmacies may offer cheaper prices.  The website www.goodrx.com contains coupons for medications through different pharmacies. The prices here do not account for what the cost may be with help from insurance (it may be cheaper with your insurance), but the website can give you the price if you did not use any insurance.  - You can print the associated coupon and take it with your prescription to the pharmacy.  - You may also stop by our office during regular business hours and pick up a GoodRx coupon card.  - If you need your prescription sent electronically to a different pharmacy, notify our office through [facility] or by phone at [phone] option 4.     Si Usted Necesita Algo Despus de Su Visita  Tambin puede enviarnos un mensaje a travs de Pharmacist, community. Por lo general respondemos a los mensajes de MyChart en el transcurso de 1 a 2 das hbiles.  Para renovar recetas, por favor pida a su farmacia que se ponga en contacto con nuestra oficina. Harland Dingwall de fax es [place] [phone].  Si tiene un asunto urgente cuando la clnica est cerrada y que no puede esperar hasta el siguiente da hbil, puede llamar/localizar a su doctor(a) al nmero que aparece a continuacin.   Por favor, tenga en cuenta que aunque hacemos todo lo posible para estar disponibles para asuntos urgentes fuera del horario de [place], no estamos  disponibles las 24 horas del da, los 7 das de la [place].   Si tiene un problema urgente y no puede comunicarse con nosotros, puede optar por buscar atencin mdica  en el consultorio de su doctor(a), en una clnica privada, en un centro de atencin urgente o en una sala de emergencias.  Si tiene Engineering geologist, por favor llame inmediatamente al 911 o vaya a la sala de emergencias.  Nmeros de bper  - Dr. Nehemiah Massed: [phone]  - Dra. Moye: [phone]  - Dra. Nicole Kindred: [phone]  En caso de inclemencias del [place], por favor llame a Johnsie Kindred principal al [phone] para una actualizacin sobre el [place] de cualquier retraso o cierre.  Consejos para la medicacin en dermatologa: Por favor, guarde las cajas en las que vienen los medicamentos de uso tpico para ayudarle a seguir las instrucciones sobre dnde y cmo usarlos. Las farmacias generalmente imprimen las instrucciones del medicamento slo en  las cajas y no directamente en los tubos del medicamento.   Si su medicamento es muy caro, por favor, pngase en contacto con nuestra oficina llamando al [phone] y presione la opcin 4 o envenos un mensaje a travs de MyChart.   No podemos decirle cul ser su copago por los medicamentos por adelantado ya que esto es diferente dependiendo de la cobertura de su seguro. Sin embargo, es posible que podamos encontrar un medicamento sustituto a menor costo o llenar un formulario para que el seguro cubra el medicamento que se considera necesario.   Si se requiere una autorizacin previa para que su compaa de seguros cubra su medicamento, por favor permtanos de 1 a 2 das hbiles para completar este proceso.  Los precios de los medicamentos varan con frecuencia dependiendo del lugar de dnde se surte la receta y alguna farmacias pueden ofrecer precios ms baratos.  El sitio web www.goodrx.com tiene cupones para medicamentos de diferentes farmacias. Los precios aqu no  tienen en cuenta lo que podra costar con la ayuda del seguro (puede ser ms barato con su seguro), pero el sitio web puede darle el precio si no utiliz ningn seguro.  - Puede imprimir el cupn correspondiente y llevarlo con su receta a la farmacia.  - Tambin puede pasar por nuestra oficina durante el horario de atencin regular y recoger una tarjeta de cupones de GoodRx.  - Si necesita que su receta se enve electrnicamente a una farmacia diferente, informe a nuestra oficina a travs de MyChart de [place] o por telfono llamando al [phone] y presione la opcin 4.  

## 2022-05-03 NOTE — Progress Notes (Signed)
   New Patient Visit  Subjective  Bethany Edwards is a 42 y.o. female who presents for the following: Skin Problem (Spot at left upper eyelid. Dur: 1-2 years. Non tender, obstructs peripheral vision at times. PCP and optometrist recommended derm appointment. Check spot under left breast. Mole changing in appearance. Adopted, does not know family history).  The patient has spots, moles and lesions to be evaluated, some may be new or changing and the patient has concerns that these could be cancer.   Review of Systems: No other skin or systemic complaints except as noted in HPI or Assessment and Plan.   Objective  Well appearing patient in no apparent distress; mood and affect are within normal limits.  A focused examination was performed including face, left eyelid, left inframammary. Relevant physical exam findings are noted in the Assessment and Plan.  Left Upper Eyelid Margin White firm papule   Assessment & Plan  Epidermal inclusion cyst Left Upper Eyelid Margin  Benign-appearing. Exam most consistent with an epidermal inclusion cyst. Discussed that a cyst is a benign growth that can grow over time and sometimes get irritated or inflamed. Recommend observation if it is not bothersome. Discussed option of surgical excision to remove it if it is growing, symptomatic, or other changes noted. Please call for new or changing lesions so they can be evaluated.  Pt states it has grown and gets in way of vision, would like it removed     Incision and Drainage - Left Upper Eyelid Margin Location: left upper eyelid  Informed Consent: Discussed risks (permanent scarring, light or dark discoloration, infection, pain, bleeding, bruising, redness, damage to adjacent structures, and recurrence of the lesion) and benefits of the procedure, as well as the alternatives.  Informed consent was obtained.  Preparation: The area was prepped with Puracyn.  Anesthesia: Lidocaine 1% with  epinephrine  Procedure Details: An incision was made overlying the lesion. The lesion drained white, chalky cyst material.  A small amount of fluid was drained.    Antibiotic ointment and a sterile pressure dressing were applied. The patient tolerated procedure well.  Total number of lesions drained: 1  Plan: The patient was instructed on post-op care. Recommend OTC analgesia as needed for pain.    Seborrheic Keratoses. Inframammary - Stuck-on, waxy, tan-brown papules and/or plaques  - Benign-appearing - Discussed benign etiology and prognosis. - Observe - Call for any changes  Lentigines - Scattered tan macules - Due to sun exposure - Benign-appearing, observe - Recommend daily broad spectrum sunscreen SPF 30+ to sun-exposed areas, reapply every 2 hours as needed. - Call for any changes   Return if symptoms worsen or fail to improve.  I, Emelia Salisbury, CMA, am acting as scribe for Brendolyn Patty, MD.  Documentation: I have reviewed the above documentation for accuracy and completeness, and I agree with the above.  Brendolyn Patty MD

## 2022-05-16 ENCOUNTER — Encounter: Payer: Self-pay | Admitting: Physician Assistant

## 2022-06-30 ENCOUNTER — Encounter: Payer: Self-pay | Admitting: Physician Assistant

## 2022-06-30 ENCOUNTER — Other Ambulatory Visit: Payer: Self-pay | Admitting: Physician Assistant

## 2022-06-30 DIAGNOSIS — B379 Candidiasis, unspecified: Secondary | ICD-10-CM

## 2022-06-30 MED ORDER — FLUCONAZOLE 150 MG PO TABS: 150.0000 mg | ORAL_TABLET | Freq: Once | ORAL | 0 refills | Status: AC

## 2022-07-07 ENCOUNTER — Ambulatory Visit
Admission: RE | Admit: 2022-07-07 | Discharge: 2022-07-07 | Disposition: A | Discharge status: Home / Self Care | Payer: BLUE CROSS/BLUE SHIELD | Source: Ambulatory Visit | Attending: Physician Assistant | Admitting: Physician Assistant

## 2022-07-07 DIAGNOSIS — Z1231 Encounter for screening mammogram for malignant neoplasm of breast: Secondary | ICD-10-CM | POA: Insufficient documentation

## 2022-07-14 ENCOUNTER — Encounter: Payer: Self-pay | Admitting: Physician Assistant

## 2022-07-29 ENCOUNTER — Emergency Department: Payer: BLUE CROSS/BLUE SHIELD

## 2022-07-29 ENCOUNTER — Emergency Department
Admission: EM | Admit: 2022-07-29 | Discharge: 2022-07-29 | Disposition: A | Discharge status: Home / Self Care | Payer: BLUE CROSS/BLUE SHIELD | Attending: Emergency Medicine | Admitting: Emergency Medicine

## 2022-07-29 DIAGNOSIS — N858 Other specified noninflammatory disorders of uterus: Secondary | ICD-10-CM | POA: Insufficient documentation

## 2022-07-29 DIAGNOSIS — R102 Pelvic and perineal pain: Secondary | ICD-10-CM

## 2022-07-29 DIAGNOSIS — R109 Unspecified abdominal pain: Secondary | ICD-10-CM | POA: Diagnosis present

## 2022-07-29 LAB — COMPREHENSIVE METABOLIC PANEL
ALT: 13 U/L (ref 0–44)
AST: 14 U/L — ABNORMAL LOW (ref 15–41)
Albumin: 4.3 g/dL (ref 3.5–5.0)
Alkaline Phosphatase: 58 U/L (ref 38–126)
Anion gap: 9 (ref 5–15)
BUN: 8 mg/dL (ref 6–20)
CO2: 26 mmol/L (ref 22–32)
Calcium: 8.8 mg/dL — ABNORMAL LOW (ref 8.9–10.3)
Chloride: 101 mmol/L (ref 98–111)
Creatinine, Ser: 0.67 mg/dL (ref 0.44–1.00)
GFR, Estimated: >60 mL/min (ref >60)
Glucose, Bld: 134 mg/dL — ABNORMAL HIGH (ref 70–99)
Potassium: 4.1 mmol/L (ref 3.5–5.1)
Sodium: 136 mmol/L (ref 135–145)
Total Bilirubin: 0.8 mg/dL (ref 0.3–1.2)
Total Protein: 7.3 g/dL (ref 6.5–8.1)

## 2022-07-29 LAB — CHLAMYDIA/NGC RT PCR (ARMC ONLY)
Chlamydia Tr: NOT DETECTED
N gonorrhoeae: NOT DETECTED

## 2022-07-29 LAB — URINALYSIS, ROUTINE W REFLEX MICROSCOPIC
Bilirubin Urine: NEGATIVE
Glucose, UA: NEGATIVE mg/dL
Ketones, ur: NEGATIVE mg/dL
Leukocytes,Ua: NEGATIVE
Nitrite: NEGATIVE
Protein, ur: NEGATIVE mg/dL
Specific Gravity, Urine: 1.003 — ABNORMAL LOW (ref 1.005–1.030)
pH: 5 (ref 5.0–8.0)

## 2022-07-29 LAB — CBC
HCT: 37.4 % (ref 36.0–46.0)
Hemoglobin: 12.1 g/dL (ref 12.0–15.0)
MCH: 29.4 pg (ref 26.0–34.0)
MCHC: 32.4 g/dL (ref 30.0–36.0)
MCV: 90.8 fL (ref 80.0–100.0)
Platelets: 264 10*3/uL (ref 150–400)
RBC: 4.12 MIL/uL (ref 3.87–5.11)
RDW: 12 % (ref 11.5–15.5)
WBC: 7.5 10*3/uL (ref 4.0–10.5)
nRBC: 0 % (ref 0.0–0.2)

## 2022-07-29 LAB — WET PREP, GENITAL
Clue Cells Wet Prep HPF POC: NONE SEEN
Sperm: NONE SEEN
Trich, Wet Prep: NONE SEEN
WBC, Wet Prep HPF POC: <10 (ref <10)
Yeast Wet Prep HPF POC: NONE SEEN

## 2022-07-29 LAB — LIPASE, BLOOD: Lipase: 39 U/L (ref 11–51)

## 2022-07-29 LAB — PREGNANCY, URINE: Preg Test, Ur: NEGATIVE

## 2022-07-29 MED ORDER — DOXYCYCLINE HYCLATE 50 MG PO CAPS: 100.0000 mg | ORAL_CAPSULE | Freq: Two times a day (BID) | ORAL | 0 refills | Status: AC

## 2022-07-29 MED ORDER — IOHEXOL 300 MG/ML  SOLN
100.0000 mL | Freq: Once | INTRAMUSCULAR | Status: AC | PRN
  Administered 2022-07-29: 100 mL via INTRAVENOUS

## 2022-07-29 NOTE — ED Provider Notes (Addendum)
[facility] Provider Note    Event Date/Time   First MD Initiated Contact with Patient 07/29/22 1240     (approximate)   History   Abdominal Pain   HPI  Bethany Edwards is a 42 y.o. female   Past medical history of diabetes, depression, hyperlipidemia status post tubal ligation who presents to the emergency department with lower abdominal pain worsening over the last 3 days, constant.  Right-sided lower abdominal pain.  No nausea vomiting or diarrhea.  Last bowel movement was several days ago, she is passing flatus, she has no urinary symptoms like dysuria or frequency no vaginal discharge or bleeding.  She states that she has occasional cramping that she associates with her premenopausal symptoms but this is worse than what she expects.  Has no vaginal discharge and no high risk sexual activities like multiple partners unprotected sex and declines STI testing today.   External Medical Documents Reviewed: Physician assistant office visit from December 2023 documenting past medical history      Physical Exam   Triage Vital Signs: ED Triage Vitals  Enc Vitals Group     BP 07/29/22 1231 (!) 130/98     Pulse Rate 07/29/22 1231 88     Resp 07/29/22 1231 19     Temp 07/29/22 1231 98.4 F (36.9 C)     Temp Source 07/29/22 1231 Oral     SpO2 07/29/22 1231 100 %     Weight 07/29/22 1229 142 lb (64.4 kg)     Height --      Head Circumference --      Peak Flow --      Pain Score 07/29/22 1229 6     Pain Loc --      Pain Edu? --      Excl. in GC? --     Most recent vital signs: Vitals:   07/29/22 1231  BP: (!) 130/98  Pulse: 88  Resp: 19  Temp: 98.4 F (36.9 C)  SpO2: 100%    General: Awake, no distress.  CV:  Good peripheral perfusion.  Resp:  Normal effort.  Abd:  No distention.  Other:  Awake alert nontoxic-appearing with normal vital signs afebrile but with tenderness to palpation to the right lower quadrant   ED Results / Procedures  / Treatments   Labs (all labs ordered are listed, but only abnormal results are displayed) Labs Reviewed  COMPREHENSIVE METABOLIC PANEL - Abnormal; Notable for the following components:      Result Value   Glucose, Bld 134 (*)    Calcium 8.8 (*)    AST 14 (*)    All other components within normal limits  URINALYSIS, ROUTINE W REFLEX MICROSCOPIC - Abnormal; Notable for the following components:   Color, Urine STRAW (*)    APPearance CLEAR (*)    Specific Gravity, Urine 1.003 (*)    Hgb urine dipstick SMALL (*)    Bacteria, UA RARE (*)    All other components within normal limits  CHLAMYDIA/NGC RT PCR ([facility] ONLY)            WET PREP, GENITAL  LIPASE, BLOOD  CBC  PREGNANCY, URINE     I ordered and reviewed the above labs they are notable for she has a normal white blood cell count    RADIOLOGY I independently reviewed and interpreted CT scan of the abdomen pelvis and see 2 fluid collections with rim enhancement in the uterus   PROCEDURES:  Critical  Care performed: No  Procedures   MEDICATIONS ORDERED IN ED: Medications  iohexol (OMNIPAQUE) 300 MG/ML solution 100 mL (100 mLs Intravenous Contrast Given 07/29/22 1429)    External physician / consultants:  I spoke with Tresea Mall CNM of [facility] regarding care plan for this patient.   IMPRESSION / MDM / ASSESSMENT AND PLAN / ED COURSE  I reviewed the triage vital signs and the nursing notes.                                Patient's presentation is most consistent with acute presentation with potential threat to life or bodily function.  Differential diagnosis includes, but is not limited to, acute appendicitis, urinary tract infection, cholecystitis, biliary pathologies, musculoskeletal pain, menstrual cramping, premenopausal symptoms    MDM: This is a patient with abdominal cramping no urinary symptoms or vaginal discharge presents with right lower quadrant tenderness concern for acute appendicitis will  obtain a CT scan of the abdomen pelvis with IV contrast which will assess for acute appendicitis or other surgical abdominal pathologies, infectious etiologies.  Urinalysis with some rare bacteria and no inflammatory cells nor urinary symptoms, defer treatment for urinary tract infection.   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  I ordered STI testing GC chlamydia and wet prep and did a pelvic exam speculum exam that showed some scant white discharge. --  STI testing is negative wet prep negative GC chlamydia negative as well.  Patient remained stable nontoxic-appearing with normal vital signs afebrile.  Her transvaginal ultrasound does show 2 cystic structures within the uterus question complex cyst versus endometritis, adenomyosis, degenerated submucosal fibroids --I discussed these findings with our gynecology consultant who will assess the patient and give recommendations.  I am deferring antibiotics given normal white blood cell count, no obvious infectious cause on imaging, no fever and negative STI testing.   Gynecology recommends doxycycline oral antibiotic and follow-up next week   Patient is in a rush to go home, states that antibiotics on empty stomach causes upset stomach for her and she would rather get a prescription and take it at home after she has eaten, declines first dose in the emergency department.       FINAL CLINICAL IMPRESSION(S) / ED DIAGNOSES   Final diagnoses:  Pelvic pain in female  Uterine mass     Rx / DC Orders   ED Discharge Orders     None        Note:  This document was prepared using Dragon voice recognition software and may include unintentional dictation errors.    Pilar Jarvis, MD 07/29/22 Calvert Cantor, MD 07/29/22 [phone]

## 2022-07-29 NOTE — ED Triage Notes (Signed)
Pt sts that she has been having abd/pelvic pain. Pt sts that Azo has not helped her with the pain. Pt has a waddle to her gait due to the pain.

## 2022-07-29 NOTE — Discharge Instructions (Addendum)
Doxycycline for the full course as prescribed.  Call Dr. Valentino Saxon for a follow-up appointment this week.  Take acetaminophen 650 mg and ibuprofen 400 mg every 6 hours for pain.  Take with food.   Thank you for choosing [place] for your health care today!  Please see your primary doctor this week for a follow up appointment.   Sometimes, in the early stages of certain disease courses it is difficult to detect in the emergency department evaluation -- so, it is important that you continue to monitor your symptoms and call your doctor right away or return to the emergency department if you develop any new or worsening symptoms.  Please go to the following website to schedule new (and existing) patient appointments:   [email or web address]  If you do not have a primary doctor try calling the following clinics to establish care:  If you have insurance:  [facility] [phone] [address] [place] [place] [address]   [facility]  [phone] [address] [place]., [place] [address]   If you do not have insurance:  [facility]  [phone] [address] [place] [address]   The following is another list of primary care offices in the area who are accepting new patients at this time.  Please reach out to one of them directly and let them know you would like to schedule an appointment to follow up on an Emergency Department visit, and/or to establish a new primary care provider (PCP).  There are likely other primary care clinics in the are who are accepting new patients, but this is an excellent place to start:  [facility] Lead physician: Dr Shirlee Latch [address] [place], [place] [address] [phone]  [facility] Lead Physician: Dr Alba Cory [address], [place], [place] [address] [phone]  [facility]  Lead Physician: Dr Olevia Perches [address] [place], [place] [address] [phone]  [facility] Lead Physician: Dr Sofie Hartigan [address] [place], [place], [place] [address] [phone]  [facility] at [facility] Lead Physician: Dr Bari Edward [address] [place], [place] [address] [phone]   It was my pleasure to care for you today.   Daneil Dan Modesto Charon, MD

## 2022-08-01 ENCOUNTER — Telehealth: Payer: Self-pay

## 2022-08-01 NOTE — Telephone Encounter (Signed)
Patient called and states she's hurting so bad that she can't wait until May 2nd for an appointment. Dr.Dove can see her tomorrow 08/02/22 at 8:15 AM

## 2022-08-02 ENCOUNTER — Encounter: Payer: Self-pay | Admitting: Obstetrics & Gynecology

## 2022-08-02 ENCOUNTER — Other Ambulatory Visit (HOSPITAL_COMMUNITY)
Admission: RE | Admit: 2022-08-02 | Discharge: 2022-08-02 | Disposition: A | Discharge status: Home / Self Care | Payer: BC Managed Care – PPO | Source: Ambulatory Visit | Attending: Obstetrics and Gynecology | Admitting: Obstetrics and Gynecology

## 2022-08-02 ENCOUNTER — Ambulatory Visit (INDEPENDENT_AMBULATORY_CARE_PROVIDER_SITE_OTHER): Payer: BLUE CROSS/BLUE SHIELD | Admitting: Obstetrics & Gynecology

## 2022-08-02 VITALS — BP 142/84 | HR 84 | Ht 62.0 in | Wt 145.0 lb

## 2022-08-02 DIAGNOSIS — N858 Other specified noninflammatory disorders of uterus: Secondary | ICD-10-CM | POA: Diagnosis not present

## 2022-08-02 DIAGNOSIS — E559 Vitamin D deficiency, unspecified: Secondary | ICD-10-CM

## 2022-08-02 DIAGNOSIS — Z124 Encounter for screening for malignant neoplasm of cervix: Secondary | ICD-10-CM | POA: Diagnosis present

## 2022-08-02 MED ORDER — VITAMIN D (ERGOCALCIFEROL) 1.25 MG (50000 UNIT) PO CAPS: 50000.0000 [IU] | ORAL_CAPSULE | ORAL | 1 refills | Status: DC

## 2022-08-02 MED ORDER — FLUCONAZOLE 150 MG PO TABS: 150.0000 mg | ORAL_TABLET | Freq: Once | ORAL | 0 refills | Status: AC

## 2022-08-02 NOTE — Progress Notes (Signed)
    GYNECOLOGY PROGRESS NOTE  Subjective:    Patient ID: Bethany Edwards, female    DOB: 05/04/1980, 42 y.o.   MRN: [id number]  HPI  Patient is a 42 y.o. married [id number] female who presents for follow up from ER visit 07/29/2022. She was seen there for abdominal pain. Of note, she had a Novasure ablation in 2021. She has had no bleeding since. She has been taking IBU 800-1000mg  every 6-8 hours along with tylenol  every 10-12 hours. Her cervical cultures were negative in the ER. She was prescribed doxy 100 mg BID. This has not improved her pain.   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  Her ultrasound showed the following: IMPRESSION: 1. Negative for ovarian torsion. 2. Two avascular hypoechoic endometrial or sub endometrial masses corresponding to rim enhancing collections on CT. These appear to represent complex cysts. Differential considerations include endometritis, cystic adenomyosis, and cystic degenerated submucosal fibroids. Correlation with pelvic MRI could be helpful for further assessment. 3. Small free fluid in the pelvis.  The following portions of the patient's history were reviewed and updated as appropriate: allergies, current medications, past family history, past medical history, past social history, past surgical history, and problem list.  Review of Systems Pertinent items are noted in HPI.  Last pap was 2023 and showed ASCUS negative HR HPV. Prior to this pain episode, she is sexually active with her husband without pain.  Objective:   Blood pressure (!) 157/88, pulse 90, height  (1.575 m), weight 145 lb (65.8 kg). Body mass index is 26.52 kg/m. Well nourished, well hydrated White female, no apparent distress She is ambulating and conversing normally. Abd- benign EG- normal Vagina- normal Cervix- normal, very mild CMT Uterus- NSSA, mobile, tender with palpation Adnexa- NSS, tender with palpation  Assessment:   1. Screening for  cervical cancer   2. Uterine mass   3. Vitamin D deficiency   4. Avitaminosis D      Plan:   1. Screening for cervical cancer  - Cytology - PAP with HPV cotesting  2. Uterine masses and new onset pelvic pain - uncertain etiology, possibly degenerating fibroid - I have made it clear that this is not a life-threatening event.  - I made sure that she knows that just because her appendix was fine on CT several days ago, that if her pain worsens, she should go to the ER - I offered her narcotics to ease her pain but she declines them. Says that she won't take them. - I am not certain what treatment she needs at this moment, but I have rec'd a second opinion with another gynecologist here in the near future. - I cautioned her about kidney and liver damage with the amount of IBU and tylenol that she is taking.  3. Vitamin D deficiency - Vitamin D, Ergocalciferol, (DRISDOL) 1.25 MG (50000 UNIT) CAPS capsule; Take 1 capsule (50,000 Units total) by mouth every 7 (seven) days.  Dispense: 14 capsule; Refill: 1   4. Diflucan prescribed at her request as she believes that the doxy will lead to a future yeast infection.

## 2022-08-04 ENCOUNTER — Ambulatory Visit (INDEPENDENT_AMBULATORY_CARE_PROVIDER_SITE_OTHER): Payer: BC Managed Care – PPO | Admitting: Obstetrics and Gynecology

## 2022-08-04 ENCOUNTER — Encounter: Payer: Self-pay | Admitting: Obstetrics and Gynecology

## 2022-08-04 VITALS — BP 118/71 | HR 93 | Resp 16 | Ht 62.0 in | Wt 145.9 lb

## 2022-08-04 DIAGNOSIS — Z9889 Other specified postprocedural states: Secondary | ICD-10-CM | POA: Diagnosis not present

## 2022-08-04 DIAGNOSIS — R102 Pelvic and perineal pain: Secondary | ICD-10-CM | POA: Diagnosis not present

## 2022-08-04 DIAGNOSIS — N858 Other specified noninflammatory disorders of uterus: Secondary | ICD-10-CM

## 2022-08-04 NOTE — Progress Notes (Signed)
GYNECOLOGY PROGRESS NOTE  Subjective:    Patient ID: Bethany Edwards, female    DOB: 1980/08/07, 42 y.o.   MRN: [id number]  HPI  Patient is a 42 y.o. [id number] female who presents for evaluation of pelvic pain and abnormal pelvic ultrasound.  She was seen 2 days ago by Dr. Nicholaus Bloom after follow-up from an ER visit on 07/29/2022 due to complaints of abdominal pain.  Onset of symptoms was approximately 3 days prior to her visit to the emergency room.  Her pain was noted to be mostly right-sided and constant, progressively worsening over the last few days.  She denied vaginal discharge or any abnormal bleeding.  Of note patient has had a endometrial ablation in 2021.  She has been taking Tylenol and ibuprofen without relief.  Workup at the time of her visit included pelvic ultrasound and vaginal cultures.  Vaginal cultures were negative.  Ultrasound revealed the following:   IMPRESSION: 1. Negative for ovarian torsion. 2. Two avascular hypoechoic endometrial or sub endometrial masses corresponding to rim enhancing collections on CT. These appear to represent complex cysts. Differential considerations include endometritis, cystic adenomyosis, and cystic degenerated submucosal fibroids. Correlation with pelvic MRI could be helpful for further assessment. 3. Small free fluid in the pelvis.  She was prescribed Doxycyline for possible hydrosalpinx, which she recently started 4 days. Currently notes pain has decreased now to a 3/10 (previously 8/10).    The following portions of the patient's history were reviewed and updated as appropriate:   She  has a past medical history of Depression, Diabetes mellitus without complication, and Hyperlipidemia.  She  has a past surgical history that includes Tubal ligation (2003); Incision and drainage abscess (Left, 2003 and 2007); Hysteroscopy with D & C (N/A, 10/22/2019); and Hysteroscopy with novasure (N/A, 10/22/2019).  Her She was adopted. Family history is  unknown by patient.  She  reports that she has quit smoking. Her smoking use included cigarettes. She has a 12.50 pack-year smoking history. She has never used smokeless tobacco. She reports that she does not drink alcohol and does not use drugs.  Current Outpatient Medications on File Prior to Visit  Medication Sig Dispense Refill   doxycycline (VIBRAMYCIN) 50 MG capsule Take 2 capsules (100 mg total) by mouth 2 (two) times daily for 10 days. 40 capsule 0   ibuprofen (ADVIL) 800 MG tablet TAKE 1 TABLET(800 MG) BY MOUTH EVERY 8 HOURS AS NEEDED FOR CRAMPING 30 tablet 1   LINZESS 290 MCG CAPS capsule TAKE 1 CAPSULE(290 MCG) BY MOUTH DAILY BEFORE AND BREAKFAST 90 capsule 0   ondansetron (ZOFRAN) 4 MG tablet Take 1 tablet (4 mg total) by mouth every 8 (eight) hours as needed for nausea or vomiting. 20 tablet 0   rosuvastatin (CRESTOR) 10 MG tablet Take 1 tablet (10 mg total) by mouth daily. 90 tablet 3   Semaglutide, 1 MG/DOSE, 4 MG/3ML SOPN Inject 1 mg into the skin once a week. 9 mL 1   traZODone (DESYREL) 50 MG tablet Take 0.5-1 tablets (25-50 mg total) by mouth at bedtime as needed for sleep. 30 tablet 3   Vitamin D, Ergocalciferol, (DRISDOL) 1.25 MG (50000 UNIT) CAPS capsule Take 1 capsule (50,000 Units total) by mouth every 7 (seven) days. 14 capsule 1   metFORMIN (GLUCOPHAGE-XR) 750 MG 24 hr tablet Take 1 tablet (750 mg total) by mouth daily with breakfast. (Patient not taking: Reported on 08/02/2022) 90 tablet 1   nystatin-triamcinolone (MYCOLOG II) cream Apply 1  application topically 2 (two) times daily. (Patient not taking: Reported on 08/02/2022) 30 g 1   No current facility-administered medications on file prior to visit.   She is allergic to penicillins..  Review of Systems Pertinent items are noted in HPI.   Objective:   Blood pressure 118/71, pulse 93, resp. rate 16, height  (1.575 m), weight 145 lb 14.4 oz (66.2 kg). Body mass index is 26.69 kg/m. General appearance: alert,  cooperative, and no distress Remainder of exam deferred.    Assessment:   1. Pelvic pain   2. History of endometrial ablation   3. Uterine cyst   4. Hydrosalpinx      Plan:   Discussion had with patient regarding ultrasound findings.  Discussed differential in detail including cystic adenomyosis versus degenerating submucous fibroids.  Patient denies a history of fibroids in the past.  Last ultrasound in 2021 prior to endometrial ablation did not identify any uterine masses.  Patient also denies history of endometriosis in the past.  Discussed possibility of hematometra due to history of endometrial ablation.  As of now patient is pain has improved with the use of OTC pain medications and treatment with doxycycline.  No obvious cause for possible endometritis however patient symptoms appear to be responding to antibiotic treatment.  Can continue on for now. Advised that the likelihood of occult malignancy based on ultrasound findings was very low.  Patient currently without any issues with abnormal bleeding.  I discussed that if she does begin to experience bleeding further investigation may be warranted with an endometrial biopsy (however can be less useful after ablation) versus hysteroscopy D&C.  Patient notes that she was worried about this as she is adopted and is unaware of her family history as it pertains to cancers.  Return to clinic for any scheduled appointments or for any gynecologic concerns as needed.    A total of 28 minutes were spent during this encounter, including review of previous progress notes, recent imaging and labs, face-to-face with time with patient involving counseling and coordination of care, as well as documentation for current visit.   Hildred Laser, MD [facility]

## 2022-08-05 LAB — CYTOLOGY - PAP
Comment: NEGATIVE
Diagnosis: NEGATIVE
High risk HPV: NEGATIVE

## 2022-08-14 ENCOUNTER — Encounter: Payer: Self-pay | Admitting: Dermatology

## 2022-08-17 ENCOUNTER — Encounter: Payer: Commercial Managed Care - PPO | Admitting: Physician Assistant

## 2022-08-18 ENCOUNTER — Ambulatory Visit: Payer: BLUE CROSS/BLUE SHIELD | Admitting: Obstetrics and Gynecology

## 2022-08-18 ENCOUNTER — Ambulatory Visit (INDEPENDENT_AMBULATORY_CARE_PROVIDER_SITE_OTHER): Payer: BC Managed Care – PPO | Admitting: Physician Assistant

## 2022-08-18 ENCOUNTER — Encounter: Payer: Self-pay | Admitting: Physician Assistant

## 2022-08-18 VITALS — BP 133/83 | HR 86 | Ht 62.0 in | Wt 144.9 lb

## 2022-08-18 DIAGNOSIS — I152 Hypertension secondary to endocrine disorders: Secondary | ICD-10-CM

## 2022-08-18 DIAGNOSIS — E559 Vitamin D deficiency, unspecified: Secondary | ICD-10-CM

## 2022-08-18 DIAGNOSIS — Z0000 Encounter for general adult medical examination without abnormal findings: Secondary | ICD-10-CM | POA: Diagnosis not present

## 2022-08-18 DIAGNOSIS — E1165 Type 2 diabetes mellitus with hyperglycemia: Secondary | ICD-10-CM | POA: Diagnosis not present

## 2022-08-18 DIAGNOSIS — E785 Hyperlipidemia, unspecified: Secondary | ICD-10-CM

## 2022-08-18 DIAGNOSIS — E1169 Type 2 diabetes mellitus with other specified complication: Secondary | ICD-10-CM

## 2022-08-18 DIAGNOSIS — E1159 Type 2 diabetes mellitus with other circulatory complications: Secondary | ICD-10-CM | POA: Diagnosis not present

## 2022-08-18 NOTE — Assessment & Plan Note (Signed)
LDL goal < 70 managed on crestor 10 mg  Repeat fasting lipids

## 2022-08-18 NOTE — Assessment & Plan Note (Signed)
Last A1c 6.8% ordered today Pt d/c metformin d/t diarrhea Managing with ozempic 1 mg weekly  Discussed diet at length On statin  Uacr ordered, optho utd, foot exam utd F/u 4-6 mo pending a1c

## 2022-08-18 NOTE — Assessment & Plan Note (Addendum)
Well controlled with diet and weight loss Will monitor Reviewed cmp F/u 6 mo

## 2022-08-18 NOTE — Assessment & Plan Note (Signed)
Pt restarted weekly supplement

## 2022-08-18 NOTE — Progress Notes (Signed)
I,[name],acting as a Neurosurgeon for [employer], PA-C.,have documented all relevant documentation on the behalf of Alfredia Ferguson, PA-C,as directed by  Alfredia Ferguson, PA-C while in the presence of Alfredia Ferguson, PA-C.   Complete physical exam   Patient: Bethany Edwards   DOB: 1980/06/11   42 y.o. Female  MRN: [id number] Visit Date: 08/18/2022  Today's healthcare provider: Alfredia Ferguson, PA-C   Cc. cpe  Subjective    Bethany Edwards is a 42 y.o. female who presents today for a complete physical exam.  She reports consuming a general diet.  The patient reports walking on the treadmilll 3-4 night  week for 30 minutes at least.  She generally feels well. She reports sleeping fairly well. She does not have additional problems to discuss today.  HPI    Past Medical History:  Diagnosis Date   Anemia    Anxiety    Depression    Diabetes mellitus without complication (HCC)    Type II   Hyperlipidemia    Past Surgical History:  Procedure Laterality Date   HYSTEROSCOPY WITH D & C N/A 10/22/2019   Procedure: DILATATION AND CURETTAGE /HYSTEROSCOPY W ABLATION;  Surgeon: Natale Milch, MD;  Location: [facility] ORS;  Service: Gynecology;  Laterality: N/A;   HYSTEROSCOPY WITH NOVASURE N/A 10/22/2019   Procedure: HYSTEROSCOPY WITH NOVASURE;  Surgeon: Natale Milch, MD;  Location: [facility] ORS;  Service: Gynecology;  Laterality: N/A;   INCISION AND DRAINAGE ABSCESS Left 2003 and 2007   Axillary Abscess   TUBAL LIGATION  2003   Social History   Socioeconomic History   Marital status: Married    Spouse name: Casimiro Needle   Number of children: 2   Years of education: Not on file   Highest education level: Not on file  Occupational History   Not on file  Tobacco Use   Smoking status: Former    Packs/day: 0.50    Years: 25.00    Additional pack years: 0.00    Total pack years: 12.50    Types: Cigarettes   Smokeless tobacco: Never   Tobacco comments:    Quit fall 2022  Vaping  Use   Vaping Use: Never used  Substance and Sexual Activity   Alcohol use: No   Drug use: No   Sexual activity: Yes    Birth control/protection: Surgical    Comment: Endometrial ablation 2021  Other Topics Concern   Not on file  Social History Narrative   Right handed   Lives in a single story with husband, 2 kids and step daughter, dog and reptiles.   Social Determinants of Health   Financial Resource Strain: Not on file  Food Insecurity: Not on file  Transportation Needs: Not on file  Physical Activity: Not on file  Stress: Not on file  Social Connections: Not on file  Intimate Partner Violence: Not on file   Family Status  Relation Name Status   Mother Name unknown, adopted at birth Deceased   Family History  Adopted: Yes  Problem Relation Age of Onset   Diabetes Mother 55   Allergies  Allergen Reactions   Other Other (See Comments)   Penicillins Rash    Has patient had a PCN reaction causing immediate rash, facial/tongue/throat swelling, SOB or lightheadedness with hypotension: Unknown Has patient had a PCN reaction causing severe rash involving mucus membranes or skin necrosis: Unknown Has patient had a PCN reaction that required hospitalization: Unknown Has patient had a PCN reaction occurring  within the last 10 years: No If all of the above answers are "NO", then may proceed with Cephalosporin use.     Patient Care Team: Alfredia Ferguson, Cordelia Poche as PCP - General (Physician Assistant)   Medications: Outpatient Medications Prior to Visit  Medication Sig   ibuprofen (ADVIL) 800 MG tablet TAKE 1 TABLET(800 MG) BY MOUTH EVERY 8 HOURS AS NEEDED FOR CRAMPING   LINZESS 290 MCG CAPS capsule TAKE 1 CAPSULE(290 MCG) BY MOUTH DAILY BEFORE AND BREAKFAST (Patient taking differently: Taking as needed)   nystatin-triamcinolone (MYCOLOG II) cream Apply 1 application topically 2 (two) times daily. (Patient taking differently: Apply 1 application  topically 2 (two) times daily.  As needed)   ondansetron (ZOFRAN) 4 MG tablet Take 1 tablet (4 mg total) by mouth every 8 (eight) hours as needed for nausea or vomiting.   rosuvastatin (CRESTOR) 10 MG tablet Take 1 tablet (10 mg total) by mouth daily.   Semaglutide, 1 MG/DOSE, 4 MG/3ML SOPN Inject 1 mg into the skin once a week.   traZODone (DESYREL) 50 MG tablet Take 0.5-1 tablets (25-50 mg total) by mouth at bedtime as needed for sleep.   Vitamin D, Ergocalciferol, (DRISDOL) 1.25 MG (50000 UNIT) CAPS capsule Take 1 capsule (50,000 Units total) by mouth every 7 (seven) days.   metFORMIN (GLUCOPHAGE-XR) 750 MG 24 hr tablet Take 1 tablet (750 mg total) by mouth daily with breakfast. (Patient not taking: Reported on 08/02/2022)   No facility-administered medications prior to visit.    Review of Systems  Constitutional:  Positive for fatigue.     Objective    BP 133/83 (BP Location: Left Arm, Patient Position: Sitting, Cuff Size: Normal)   Pulse 86   Ht 5\' 2"  (1.575 m)   Wt 144 lb 14.4 oz (65.7 kg)   SpO2 100%   BMI 26.50 kg/m   Physical Exam Constitutional:      General: She is awake.     Appearance: She is well-developed. She is not ill-appearing.  HENT:     Head: Normocephalic.     Right Ear: Tympanic membrane normal.     Left Ear: Tympanic membrane normal.     Nose: Nose normal. No congestion or rhinorrhea.     Mouth/Throat:     Pharynx: No oropharyngeal exudate or posterior oropharyngeal erythema.  Eyes:     Conjunctiva/sclera: Conjunctivae normal.     Pupils: Pupils are equal, round, and reactive to light.  Neck:     Thyroid: No thyroid mass or thyromegaly.  Cardiovascular:     Rate and Rhythm: Normal rate and regular rhythm.     Heart sounds: Normal heart sounds.  Pulmonary:     Effort: Pulmonary effort is normal.     Breath sounds: Normal breath sounds.  Abdominal:     Palpations: Abdomen is soft.     Tenderness: There is no abdominal tenderness.  Musculoskeletal:     Right lower leg: No  swelling. No edema.     Left lower leg: No swelling. No edema.  Lymphadenopathy:     Cervical: No cervical adenopathy.  Skin:    General: Skin is warm.  Neurological:     Mental Status: She is alert and oriented to person, place, and time.  Psychiatric:        Attention and Perception: Attention normal.        Mood and Affect: Mood normal.        Speech: Speech normal.        Behavior:  Behavior normal. Behavior is cooperative.      Last depression screening scores    08/18/2022    1:51 PM 04/06/2022    2:14 PM 06/01/2021   10:40 AM  PHQ 2/9 Scores  PHQ - 2 Score 0 0 2  PHQ- 9 Score 4 2 6    Last fall risk screening    08/18/2022    1:50 PM  Fall Risk   Falls in the past year? 0  Number falls in past yr: 0  Injury with Fall? 0  Risk for fall due to : No Fall Risks  Follow up Falls evaluation completed   Last Audit-C alcohol use screening    08/18/2022    1:50 PM  Alcohol Use Disorder Test (AUDIT)  1. How often do you have a drink containing alcohol? 0  2. How many drinks containing alcohol do you have on a typical day when you are drinking? 0  3. How often do you have six or more drinks on one occasion? 0  AUDIT-C Score 0   A score of 3 or more in women, and 4 or more in men indicates increased risk for alcohol abuse, EXCEPT if all of the points are from question 1   No results found for any visits on 08/18/22.  Assessment & Plan    Routine Health Maintenance and Physical Exam  Exercise Activities and Dietary recommendations --balanced diet high in fiber and protein, low in sugars, carbs, fats. --physical activity/exercise 30 minutes 3-5 times a week     Immunization History  Administered Date(s) Administered   Influenza-Unspecified 02/10/2020, 01/16/2021   Moderna Sars-Covid-2 Vaccination 04/23/2019, 05/21/2019, 03/04/2020   Pneumococcal Polysaccharide-23 01/20/2017   Tdap 01/03/2008, 09/10/2019    Health Maintenance  Topic Date Due   COVID-19 Vaccine (4 -  2023-24 season) 12/17/2021   Diabetic kidney evaluation - Urine ACR  06/01/2022   OPHTHALMOLOGY EXAM  09/16/2022   HEMOGLOBIN A1C  10/06/2022   INFLUENZA VACCINE  11/17/2022   FOOT EXAM  11/25/2022   MAMMOGRAM  07/07/2023   Diabetic kidney evaluation - eGFR measurement  07/29/2023   PAP SMEAR-Modifier  08/01/2025   DTaP/Tdap/Td (3 - Td or Tdap) 09/09/2029   Hepatitis C Screening  Completed   HIV Screening  Completed   HPV VACCINES  Aged Out    Discussed health benefits of physical activity, and encouraged her to engage in regular exercise appropriate for her age and condition.  Problem List Items Addressed This Visit       Cardiovascular and Mediastinum   Hypertension associated with diabetes (HCC)    Well controlled with diet and weight loss Will monitor Reviewed cmp F/u 6 mo        Endocrine   Type 2 diabetes mellitus with hyperglycemia, without long-term current use of insulin (HCC)    Last A1c 6.8% ordered today Pt d/c metformin d/t diarrhea Managing with ozempic 1 mg weekly  Discussed diet at length On statin  Uacr ordered, optho utd, foot exam utd F/u 4-6 mo pending a1c      Relevant Orders   Urine microalbumin-creatinine with uACR   HgB A1c   Lipid Profile   Hyperlipidemia associated with type 2 diabetes mellitus (HCC)    LDL goal < 70 managed on crestor 10 mg  Repeat fasting lipids      Relevant Orders   Lipid Profile     Other   Avitaminosis D    Pt restarted weekly supplement  Other Visit Diagnoses     Annual physical exam    -  Primary   Relevant Orders   HgB A1c   TSH   Lipid Profile       Return for 4-6 mo pending a1c.     I, Alfredia Ferguson, PA-C have reviewed all documentation for this visit. The documentation on  08/18/22   for the exam, diagnosis, procedures, and orders are all accurate and complete.  Alfredia Ferguson, PA-C [facility] [address] [place], [place], [address] Office: [phone] Fax:  [phone]   [facility]

## 2022-08-19 LAB — MICROALBUMIN / CREATININE URINE RATIO
Creatinine, Urine: 85.5 mg/dL
Microalb/Creat Ratio: 11 mg/g creat (ref 0–29)
Microalbumin, Urine: 9.8 ug/mL

## 2022-08-19 LAB — LIPID PANEL
Chol/HDL Ratio: 3.8 ratio (ref 0.0–4.4)
Cholesterol, Total: 149 mg/dL (ref 100–199)
HDL: 39 mg/dL — ABNORMAL LOW (ref >39)
LDL Chol Calc (NIH): 91 mg/dL (ref 0–99)
Triglycerides: 101 mg/dL (ref 0–149)
VLDL Cholesterol Cal: 19 mg/dL (ref 5–40)

## 2022-08-19 LAB — TSH: TSH: 1.48 u[IU]/mL (ref 0.450–4.500)

## 2022-08-19 LAB — HEMOGLOBIN A1C
Est. average glucose Bld gHb Est-mCnc: 148 mg/dL
Hgb A1c MFr Bld: 6.8 % — ABNORMAL HIGH (ref 4.8–5.6)

## 2022-08-24 ENCOUNTER — Other Ambulatory Visit: Payer: Self-pay | Admitting: Physician Assistant

## 2022-08-24 DIAGNOSIS — E1165 Type 2 diabetes mellitus with hyperglycemia: Secondary | ICD-10-CM

## 2022-10-27 ENCOUNTER — Ambulatory Visit: Payer: BC Managed Care – PPO | Admitting: Physician Assistant

## 2022-11-16 ENCOUNTER — Ambulatory Visit (INDEPENDENT_AMBULATORY_CARE_PROVIDER_SITE_OTHER): Payer: BC Managed Care – PPO | Admitting: Dermatology

## 2022-11-16 ENCOUNTER — Encounter: Payer: Self-pay | Admitting: Dermatology

## 2022-11-16 VITALS — BP 122/73 | HR 101

## 2022-11-16 DIAGNOSIS — L821 Other seborrheic keratosis: Secondary | ICD-10-CM

## 2022-11-16 DIAGNOSIS — L719 Rosacea, unspecified: Secondary | ICD-10-CM

## 2022-11-16 DIAGNOSIS — W908XXA Exposure to other nonionizing radiation, initial encounter: Secondary | ICD-10-CM | POA: Diagnosis not present

## 2022-11-16 DIAGNOSIS — L814 Other melanin hyperpigmentation: Secondary | ICD-10-CM | POA: Diagnosis not present

## 2022-11-16 DIAGNOSIS — L820 Inflamed seborrheic keratosis: Secondary | ICD-10-CM | POA: Diagnosis not present

## 2022-11-16 DIAGNOSIS — L720 Epidermal cyst: Secondary | ICD-10-CM

## 2022-11-16 DIAGNOSIS — L578 Other skin changes due to chronic exposure to nonionizing radiation: Secondary | ICD-10-CM

## 2022-11-16 MED ORDER — METRONIDAZOLE 1 % EX GEL: CUTANEOUS | 11 refills | Status: DC

## 2022-11-16 NOTE — Progress Notes (Signed)
Follow-Up Visit   Subjective  Bethany Edwards is a 42 y.o. female who presents for the following: here concerning some rough patches at face and scalp she noticed after her January appointment. Patient denies spots on scalp are symptomatic would just like to have checked.  Spot on right cheek is new and changing.  The patient has spots, moles and lesions to be evaluated, some may be new or changing and the patient may have concern these could be cancer.   The following portions of the chart were reviewed this encounter and updated as appropriate: medications, allergies, medical history  Review of Systems:  No other skin or systemic complaints except as noted in HPI or Assessment and Plan.  Objective  Well appearing patient in no apparent distress; mood and affect are within normal limits.  A focused examination was performed of the following areas: Scalp, face   Relevant exam findings are noted in the Assessment and Plan.  Right mid cheek x 1 Erythematous stuck-on, waxy papule    Assessment & Plan   ACTINIC DAMAGE - chronic, secondary to cumulative UV radiation exposure/sun exposure over time - diffuse scaly erythematous macules with underlying dyspigmentation - Recommend daily broad spectrum sunscreen SPF 30+ to sun-exposed areas, reapply every 2 hours as needed.  - Recommend staying in the shade or wearing long sleeves, sun glasses (UVA+UVB protection) and wide brim hats (4-inch brim around the entire circumference of the hat). - Call for new or changing lesions.  SEBORRHEIC KERATOSIS At scalp and face  - Stuck-on, waxy, tan-brown papules  - Benign-appearing - Discussed benign etiology and prognosis. - Observe - Call for any changes  MILIA Left nasolabial  Exam: tiny firm white papule  Discussed this is a type of cyst. Benign-appearing. Sometimes these will clear with OTC adapalene/Differin 0.1% cream QHS or retinol.  Treatment Plan: Benign, observe.     LENTIGINES Exam: scattered tan macules Due to sun exposure Treatment Plan: Benign-appearing, observe. Recommend daily broad spectrum sunscreen SPF 30+ to sun-exposed areas, reapply every 2 hours as needed.  Call for any changes  ROSACEA Exam: Inflammatory papule at nose with mild erythema   Chronic and persistent condition with duration or expected duration over one year. Condition is bothersome/symptomatic for patient. Currently flared.   Rosacea is a chronic progressive skin condition usually affecting the face of adults, causing redness and/or acne bumps. It is treatable but not curable. It sometimes affects the eyes (ocular rosacea) as well. It may respond to topical and/or systemic medication and can flare with stress, sun exposure, alcohol, exercise, topical steroids (including hydrocortisone/cortisone 10) and some foods.  Daily application of broad spectrum spf 30+ sunscreen to face is recommended to reduce flares.   Treatment Plan Start metronidazole 1 % gel qd/bid daily   Rosacea  Related Medications metroNIDAZOLE (METROGEL) 1 % gel Apply small amount topically to affected areas of face qd/bid for rosacea  Inflamed seborrheic keratosis Right mid cheek x 1  Vs flat wart   Symptomatic, irritating, patient would like treated.  Destruction of lesion - Right mid cheek x 1  Destruction method: cryotherapy   Informed consent: discussed and consent obtained   Lesion destroyed using liquid nitrogen: Yes   Region frozen until ice ball extended beyond lesion: Yes   Outcome: patient tolerated procedure well with no complications   Post-procedure details: wound care instructions given   Additional details:  Prior to procedure, discussed risks of blister formation, small wound, skin dyspigmentation, or rare scar  following cryotherapy. Recommend Vaseline ointment to treated areas while healing.   Actinic skin damage  Lentigo  Milia  Seborrheic keratosis   Return for  tbse in january .  I, Asher Muir, CMA, am acting as scribe for Willeen Niece, MD.   Documentation: I have reviewed the above documentation for accuracy and completeness, and I agree with the above.  Willeen Niece, MD

## 2022-11-16 NOTE — Patient Instructions (Addendum)
Seborrheic Keratosis  What causes seborrheic keratoses? Seborrheic keratoses are harmless, common skin growths that first appear during adult life.  As time goes by, more growths appear.  Some people may develop a large number of them.  Seborrheic keratoses appear on both covered and uncovered body parts.  They are not caused by sunlight.  The tendency to develop seborrheic keratoses can be inherited.  They vary in color from skin-colored to gray, brown, or even black.  They can be either smooth or have a rough, warty surface.   Seborrheic keratoses are superficial and look as if they were stuck on the skin.  Under the microscope this type of keratosis looks like layers upon layers of skin.  That is why at times the top layer may seem to fall off, but the rest of the growth remains and re-grows.    Treatment Seborrheic keratoses do not need to be treated, but can easily be removed in the office.  Seborrheic keratoses often cause symptoms when they rub on clothing or jewelry.  Lesions can be in the way of shaving.  If they become inflamed, they can cause itching, soreness, or burning.  Removal of a seborrheic keratosis can be accomplished by freezing, burning, or surgery. If any spot bleeds, scabs, or grows rapidly, please return to have it checked, as these can be an indication of a skin cancer.   Cryotherapy Aftercare  Wash gently with soap and water everyday.   Apply Vaseline and Band-Aid daily until healed.     Start metronidazole gel  - apply topically one to two time daily to affected areas of face   Rosacea  What is rosacea? Rosacea (say: ro-zay-sha) is a common skin disease that usually begins as a trend of flushing or blushing easily.  As rosacea progresses, a persistent redness in the center of the face will develop and may gradually spread beyond the nose and cheeks to the forehead and chin.  In some cases, the ears, chest, and back could be affected.  Rosacea may appear as tiny  blood vessels or small red bumps that occur in crops.  Frequently they can contain pus, and are called "pustules".  If the bumps do not contain pus, they are referred to as "papules".  Rarely, in prolonged, untreated cases of rosacea, the oil glands of the nose and cheeks may become permanently enlarged.  This is called rhinophyma, and is seen more frequently in men.  Signs and Risks In its beginning stages, rosacea tends to come and go, which makes it difficult to recognize.  It can start as intermittent flushing of the face.  Eventually, blood vessels may become permanently visible.  Pustules and papules can appear, but can be mistaken for adult acne.  People of all races, ages, genders and ethnic groups are at risk of developing rosacea.  However, it is more common in women (especially around menopause) and adults with fair skin between the ages of 17 and 10.  Treatment Dermatologists typically recommend a combination of treatments to effectively manage rosacea.  Treatment can improve symptoms and may stop the progression of the rosacea.  Treatment may involve both topical and oral medications.  The tetracycline antibiotics are often used for their anti-inflammatory effect; however, because of the possibility of developing antibiotic resistance, they should not be used long term at full dose.  For dilated blood vessels the options include electrodessication (uses electric current through a small needle), laser treatment, and cosmetics to hide the redness.  With all forms of treatment, improvement is a slow process, and patients may not see any results for the first 3-4 weeks.  It is very important to avoid the sun and other triggers.  Patients must wear sunscreen daily.  Skin Care Instructions: Cleanse the skin with a mild soap such as CeraVe cleanser, Cetaphil cleanser, or Dove soap once or twice daily as needed. Moisturize with Eucerin Redness Relief Daily Perfecting Lotion (has a subtle green  tint), CeraVe Moisturizing Cream, or Oil of Olay Daily Moisturizer with sunscreen every morning and/or night as recommended. Makeup should be "non-comedogenic" (won't clog pores) and be labeled "for sensitive skin". Good choices for cosmetics are: Neutrogena, Almay, and Physician's Formula.  Any product with a green tint tends to offset a red complexion. If your eyes are dry and irritated, use artificial tears 2-3 times per day and cleanse the eyelids daily with baby shampoo.  Have your eyes examined at least every 2 years.  Be sure to tell your eye doctor that you have rosacea. Alcoholic beverages tend to cause flushing of the skin, and may make rosacea worse. Always wear sunscreen, protect your skin from extreme hot and cold temperatures, and avoid spicy foods, hot drinks, and mechanical irritation such as rubbing, scrubbing, or massaging the face.  Avoid harsh skin cleansers, cleansing masks, astringents, and exfoliation. If a particular product burns or makes your face feel tight, then it is likely to flare your rosacea. If you are having difficulty finding a sunscreen that you can tolerate, you may try switching to a chemical-free sunscreen.  These are ones whose active ingredient is zinc oxide or titanium dioxide only.  They should also be fragrance free, non-comedogenic, and labeled for sensitive skin. Rosacea triggers may vary from person to person.  There are a variety of foods that have been reported to trigger rosacea.  Some patients find that keeping a diary of what they were doing when they flared helps them avoid triggers.     Due to recent changes in healthcare laws, you may see results of your pathology and/or laboratory studies on MyChart before the doctors have had a chance to review them. We understand that in some cases there may be results that are confusing or concerning to you. Please understand that not all results are received at the same time and often the doctors may need to  interpret multiple results in order to provide you with the best plan of care or course of treatment. Therefore, we ask that you please give [place] 2 business days to thoroughly review all your results before contacting the office for clarification. Should we see a critical lab result, you will be contacted sooner.   If You Need Anything After Your Visit  If you have any questions or concerns for your doctor, please call our main line at [phone] and press option 4 to reach your doctor's medical assistant. If no one answers, please leave a voicemail as directed and we will return your call as soon as possible. Messages left after 4 pm will be answered the following business day.   You may also send [place] a message via MyChart. We typically respond to MyChart messages within 1-2 business days.  For prescription refills, please ask your pharmacy to contact our office. Our fax number is [phone].  If you have an urgent issue when the clinic is closed that cannot wait until the next business day, you can page your doctor at the number below.  Please note that while we do our best to be available for urgent issues outside of office hours, we are not available 24/7.   If you have an urgent issue and are unable to reach [place], you may choose to seek medical care at your doctor's office, retail clinic, urgent care center, or emergency room.  If you have a medical emergency, please immediately call 911 or go to the emergency department.  Pager Numbers  - Dr. Gwen Pounds: [phone]  - Dr. Roseanne Reno: [phone]  In the event of inclement weather, please call our main line at [phone] for an update on the status of any delays or closures.  Dermatology Medication Tips: Please keep the boxes that topical medications come in in order to help keep track of the instructions about where and how to use these. Pharmacies typically print the medication instructions only on the boxes and not directly on  the medication tubes.   If your medication is too expensive, please contact our office at [phone] option 4 or send [place] a message through MyChart.   We are unable to tell what your co-pay for medications will be in advance as this is different depending on your insurance coverage. However, we may be able to find a substitute medication at lower cost or fill out paperwork to get insurance to cover a needed medication.   If a prior authorization is required to get your medication covered by your insurance company, please allow [place] 1-2 business days to complete this process.  Drug prices often vary depending on where the prescription is filled and some pharmacies may offer cheaper prices.  The website www.goodrx.com contains coupons for medications through different pharmacies. The prices here do not account for what the cost may be with help from insurance (it may be cheaper with your insurance), but the website can give you the price if you did not use any insurance.  - You can print the associated coupon and take it with your prescription to the pharmacy.  - You may also stop by our office during regular business hours and pick up a GoodRx coupon card.  - If you need your prescription sent electronically to a different pharmacy, notify our office through [facility] or by phone at [phone] option 4.     Si Usted Necesita Algo Despus de Su Visita  Tambin puede enviarnos un mensaje a travs de Clinical cytogeneticist. Por lo general respondemos a los mensajes de MyChart en el transcurso de 1 a 2 das hbiles.  Para renovar recetas, por favor pida a su farmacia que se ponga en contacto con nuestra oficina. Annie Sable de fax es [place] [phone].  Si tiene un asunto urgente cuando la clnica est cerrada y que no puede esperar hasta el siguiente da hbil, puede llamar/localizar a su doctor(a) al nmero que aparece a continuacin.   Por favor, tenga en cuenta que aunque hacemos todo lo  posible para estar disponibles para asuntos urgentes fuera del horario de [place], no estamos disponibles las 24 horas del da, los 7 [address]  [address] de la [place].   Si tiene un problema urgente y no puede comunicarse con nosotros, puede optar por buscar atencin mdica  en el consultorio de su doctor(a), en una clnica privada, en un centro de atencin urgente o en una sala de emergencias.  Si tiene Engineer, drilling, por favor llame inmediatamente al 911 o vaya a la sala de emergencias.  Nmeros de bper  - Dr. Gwen Pounds: [phone]  - Dra. Moye:  [phone]  - Dra. Roseanne Reno: [phone]  En caso de inclemencias del [place], por favor llame a Lacy Duverney principal al [phone] para una actualizacin sobre el [place] de cualquier retraso o cierre.  Consejos para la medicacin en dermatologa: Por favor, guarde las cajas en las que vienen los medicamentos de uso tpico para ayudarle a seguir las instrucciones sobre dnde y cmo usarlos. Las farmacias generalmente imprimen las instrucciones del medicamento slo en las cajas y no directamente en los tubos del [place].   Si su medicamento es muy caro, por favor, pngase en contacto con Rolm Gala llamando al [phone] y presione la opcin 4 o envenos un mensaje a travs de Clinical cytogeneticist.   No podemos decirle cul ser su copago por los medicamentos por adelantado ya que esto es diferente dependiendo de la cobertura de su seguro. Sin embargo, es posible que podamos encontrar un medicamento sustituto a Audiological scientist un formulario para que el seguro cubra el medicamento que se considera necesario.   Si se requiere una autorizacin previa para que su compaa de seguros [place] su medicamento, por favor permtanos de 1 a 2 das hbiles para completar [address]  Los precios de los medicamentos varan con frecuencia dependiendo del Environmental consultant de dnde se surte la receta y alguna farmacias pueden ofrecer precios ms baratos.  El sitio web  www.goodrx.com tiene cupones para medicamentos de Health and safety inspector. Los precios aqu no tienen en cuenta lo que podra costar con la ayuda del seguro (puede ser ms barato con su seguro), pero el sitio web puede darle el precio si no utiliz Tourist information centre manager.  - Puede imprimir el cupn correspondiente y llevarlo con su receta a la farmacia.  - Tambin puede pasar por nuestra oficina durante el horario de atencin regular y Education officer, museum una tarjeta de cupones de GoodRx.  - Si necesita que su receta se enve electrnicamente a una farmacia diferente, informe a nuestra oficina a travs de MyChart de [place] o por telfono llamando al [phone] y presione la opcin 4.

## 2022-11-16 NOTE — Addendum Note (Signed)
Addended by: Willeen Niece on: 11/16/2022 08:54 PM   Modules accepted: Level of Service

## 2022-12-01 ENCOUNTER — Encounter: Payer: Self-pay | Admitting: Family Medicine

## 2022-12-01 ENCOUNTER — Ambulatory Visit (INDEPENDENT_AMBULATORY_CARE_PROVIDER_SITE_OTHER): Payer: BC Managed Care – PPO | Admitting: Family Medicine

## 2022-12-01 VITALS — BP 121/80 | HR 89 | Temp 98.2°F | Ht 62.0 in | Wt 144.6 lb

## 2022-12-01 DIAGNOSIS — E1159 Type 2 diabetes mellitus with other circulatory complications: Secondary | ICD-10-CM

## 2022-12-01 DIAGNOSIS — L719 Rosacea, unspecified: Secondary | ICD-10-CM | POA: Diagnosis not present

## 2022-12-01 DIAGNOSIS — E1169 Type 2 diabetes mellitus with other specified complication: Secondary | ICD-10-CM

## 2022-12-01 DIAGNOSIS — E785 Hyperlipidemia, unspecified: Secondary | ICD-10-CM

## 2022-12-01 DIAGNOSIS — I152 Hypertension secondary to endocrine disorders: Secondary | ICD-10-CM

## 2022-12-01 DIAGNOSIS — E1165 Type 2 diabetes mellitus with hyperglycemia: Secondary | ICD-10-CM

## 2022-12-01 DIAGNOSIS — F419 Anxiety disorder, unspecified: Secondary | ICD-10-CM

## 2022-12-01 DIAGNOSIS — F5104 Psychophysiologic insomnia: Secondary | ICD-10-CM

## 2022-12-01 LAB — POCT GLYCOSYLATED HEMOGLOBIN (HGB A1C): Hemoglobin A1C: 6.4 % — AB (ref 4.0–5.6)

## 2022-12-01 MED ORDER — OZEMPIC (1 MG/DOSE) 4 MG/3ML ~~LOC~~ SOPN: 1.0000 mg | PEN_INJECTOR | SUBCUTANEOUS | 3 refills | Status: DC

## 2022-12-01 NOTE — Progress Notes (Signed)
Established Patient Office Visit  Subjective   Patient ID: Bethany Edwards, female    DOB: 1981-03-03  Age: 42 y.o. MRN: [id number]  Chief Complaint  Patient presents with   Medical Management of Chronic Issues    Patient reports good compliance and tolerance to medications. She reports checking blood sugars 2-3 times a week. Has not been taking rosuvastatin, reports she will re-start.    HPI  Discussed the use of AI scribe software for clinical note transcription with the patient, who gave verbal consent to proceed.  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 She reports that her A1c was 6.8 at her last visit and is eager to see improvement. She also mentions a new diagnosis of rosacea, for which she has been prescribed Metro Gel, although she has not yet started the medication. The patient struggles with insomnia, which she attributes to anxiety and a fear of being unable to respond to her family's needs during the night. She takes trazodone as needed, about two to three times a month. She also experiences constipation, for which she takes Linzess infrequently, describing it as a "clean out" once or twice a month. The patient had an ablation procedure in 2021 for heavy periods and reports no issues since. She admits to inconsistent use of her rosuvastatin medication, which she attributes to forgetfulness and mild nausea. She completed an eight-week course of high-dose vitamin D in May and has not been on any vitamin D supplement since.         ROS    Objective:     BP 121/80 (BP Location: Right Arm, Patient Position: Sitting, Cuff Size: Normal)   Pulse 89   Temp 98.2 F (36.8 C) (Temporal)   Ht 5\' 2"  (1.575 m)   Wt 144 lb 9.6 oz (65.6 kg)   BMI 26.45 kg/m    Physical Exam Vitals reviewed.   Constitutional:      General: She is not in acute distress.    Appearance: She is well-developed.  HENT:     Head: Normocephalic and atraumatic.  Eyes:     General: No scleral icterus.    Conjunctiva/sclera: Conjunctivae normal.  Cardiovascular:     Rate and Rhythm: Normal rate and regular rhythm.  Pulmonary:     Effort: Pulmonary effort is normal. No respiratory distress.  Skin:    General: Skin is warm and dry.     Findings: No rash.  Neurological:     Mental Status: She is alert and oriented to person, place, and time.  Psychiatric:        Behavior: Behavior normal.    Diabetic Foot Exam - Simple   Simple Foot Form Diabetic Foot exam was performed with the following findings: Yes 12/01/2022  3:00 PM  Visual Inspection No deformities, no ulcerations, no other skin breakdown bilaterally: Yes Sensation Testing Intact to touch and monofilament testing bilaterally: Yes Pulse Check Posterior Tibialis and Dorsalis pulse intact bilaterally: Yes Comments      Results for orders placed or performed in visit on 12/01/22  POCT glycosylated hemoglobin (Hb A1C)  Result Value Ref Range   Hemoglobin A1C 6.4 (A) 4.0 - 5.6 %      The 10-year ASCVD risk score (Arnett DK, et al., 2019) is: 1.4%    Assessment & Plan:  Problem List Items Addressed This Visit       Cardiovascular and Mediastinum   Hypertension associated with diabetes (HCC)   Relevant Medications   Semaglutide, 1 MG/DOSE, (OZEMPIC, 1 MG/DOSE,) 4 MG/3ML SOPN     Endocrine   Type 2 diabetes mellitus with hyperglycemia, without long-term current use of insulin (HCC) - Primary   Relevant Medications   Semaglutide, 1 MG/DOSE, (OZEMPIC, 1 MG/DOSE,) 4 MG/3ML SOPN   Other Relevant Orders   POCT glycosylated hemoglobin (Hb A1C) (Completed)   Hyperlipidemia associated with type 2 diabetes mellitus (HCC)   Relevant Medications   Semaglutide, 1 MG/DOSE, (OZEMPIC, 1 MG/DOSE,) 4 MG/3ML SOPN     Other   Anxiety    Insomnia   Rosacea        Type 2 Diabetes A1c of 6.4, indicating good control. Patient is on Ozempic 1mg  weekly. -Continue current regimen of Ozempic 1mg  weekly. -Refill Ozempic prescription. -Check A1c in 6 months.  Rosacea New diagnosis by dermatologist. Patient has not yet started prescribed MetroGel. -Encourage patient to start MetroGel as prescribed by dermatologist.  Hypertension Blood pressure is well-controlled at 121/80. -Continue current management.  Vitamin D Deficiency Patient completed 8-week course of high-dose vitamin D in May. No current supplementation. -Start over-the-counter Vitamin D 2000 units daily. -Check Vitamin D levels at next lab draw.  Hyperlipidemia Patient inconsistently taking rosuvastatin, leading to elevated cholesterol. -Encourage consistent daily use of rosuvastatin at night. -Check cholesterol levels at next lab draw.  Insomnia Patient uses trazodone PRN due to concerns about being unavailable for family emergencies. -Continue PRN use of trazodone as patient feels comfortable.  Anxiety Patient reports increasing anxiety with age, particularly related to driving and family responsibilities. -Continue to monitor.  Constipation Patient uses Linzess PRN, approximately once monthly. -Continue PRN use of Linzess.  Menorrhagia Resolved following ablation in July 2021. -No further action needed.  Smoking Patient quit in February 2022. -No further action needed.  General Health Maintenance -Perform foot exam today due to diabetes. -Check eye exam report from [facility] office. -Schedule physical exam for February.        Return in about 6 months (around 06/03/2023) for chronic disease f/u.    Shirlee Latch, MD

## 2022-12-14 ENCOUNTER — Other Ambulatory Visit: Payer: Self-pay | Admitting: Family Medicine

## 2022-12-14 DIAGNOSIS — E785 Hyperlipidemia, unspecified: Secondary | ICD-10-CM

## 2022-12-22 ENCOUNTER — Ambulatory Visit (INDEPENDENT_AMBULATORY_CARE_PROVIDER_SITE_OTHER): Payer: BC Managed Care – PPO | Admitting: Obstetrics & Gynecology

## 2022-12-22 ENCOUNTER — Encounter: Payer: Self-pay | Admitting: Obstetrics & Gynecology

## 2022-12-22 VITALS — BP 138/75 | HR 90 | Ht 63.0 in | Wt 146.0 lb

## 2022-12-22 DIAGNOSIS — L723 Sebaceous cyst: Secondary | ICD-10-CM

## 2022-12-22 NOTE — Progress Notes (Signed)
    GYNECOLOGY PROGRESS NOTE  Subjective:    Patient ID: ROSALYN LAUR, female    DOB: 09-03-1980, 42 y.o.   MRN: [id number]  HPI  Patient is a 42 y.o. married G11P2012 female who presents for a lump on her vulva. It has been present for years but has been increasing in size for several months. It is not painful, just interferes with her shaving.  The following portions of the patient's history were reviewed and updated as appropriate: allergies, current medications, past family history, past medical history, past social history, past surgical history, and problem list.  Review of Systems Pertinent items are noted in HPI.  Pap and mammogram both normal and up to date.  Objective:   Blood pressure 138/75, pulse 90, height 5\' 3"  (1.6 m), weight 146 lb (66.2 kg). Body mass index is 25.86 kg/m. Well nourished, well hydrated White female, no apparent distress She is ambulating and conversing normally. Her vulva is normal. There is a 1 cm sebaceous cyst above and to the left of her clitoris.  Assessment:   Sebaceous cyst  Plan:   She would like it opened and drained in the future. She is going to the beach in the near future to celebrate her anniversary and wants to schedule the I&D.

## 2023-02-02 ENCOUNTER — Encounter: Payer: Self-pay | Admitting: Family Medicine

## 2023-02-02 ENCOUNTER — Ambulatory Visit
Admission: RE | Admit: 2023-02-02 | Discharge: 2023-02-02 | Disposition: A | Discharge status: Home / Self Care | Payer: BC Managed Care – PPO | Attending: Family Medicine | Admitting: Family Medicine

## 2023-02-02 ENCOUNTER — Ambulatory Visit
Admission: RE | Admit: 2023-02-02 | Discharge: 2023-02-02 | Disposition: A | Discharge status: Home / Self Care | Payer: BC Managed Care – PPO | Source: Ambulatory Visit | Attending: Family Medicine | Admitting: Family Medicine

## 2023-02-02 ENCOUNTER — Ambulatory Visit: Payer: BC Managed Care – PPO | Admitting: Family Medicine

## 2023-02-02 VITALS — BP 124/76 | HR 97 | Temp 98.7°F | Ht 63.0 in | Wt 149.5 lb

## 2023-02-02 DIAGNOSIS — M25551 Pain in right hip: Secondary | ICD-10-CM

## 2023-02-02 DIAGNOSIS — M25552 Pain in left hip: Secondary | ICD-10-CM | POA: Insufficient documentation

## 2023-02-02 DIAGNOSIS — F4322 Adjustment disorder with anxiety: Secondary | ICD-10-CM

## 2023-02-02 MED ORDER — CITALOPRAM HYDROBROMIDE 20 MG PO TABS: 20.0000 mg | ORAL_TABLET | Freq: Every day | ORAL | 3 refills | Status: DC

## 2023-02-02 NOTE — Addendum Note (Signed)
Addended by: Erasmo Downer on: 02/02/2023 04:08 PM   Modules accepted: Orders

## 2023-02-02 NOTE — Progress Notes (Signed)
Acute Office Visit  Subjective:     Patient ID: Bethany Edwards, female    DOB: 01-Nov-1980, 42 y.o.   MRN: [id number]  Chief Complaint  Patient presents with   Hip Pain    Hip/leg pain for years, has worsened over time.  Anxiety- work related    HPI Discussed the use of AI scribe software for clinical note transcription with the patient, who gave verbal consent to proceed.  History of Present Illness   The patient, with a history of hip pain, presents with worsening bilateral hip pain over the past six months. The pain is located in the groin area and on the outside of the hip. The patient reports a sensation of the hip "sticking" or being "stuck," particularly when trying to move the hip in certain directions. The pain and limited range of motion have affected the patient's daily activities, including personal care tasks and playing with her grandchild. The patient has had previous imaging of the hips, which showed some arthritic changes.  In addition to the hip pain, the patient is experiencing heightened anxiety. This has been particularly noticeable since the birth of her grandchild, with the patient expressing concerns about the baby's safety and well-being. The patient also reports anxiety related to driving, work stress, and the upcoming deployment of her spouse. The patient has previously been treated with Celexa, which she found helpful.       ROS      Objective:    BP 124/76   Pulse 97   Temp 98.7 F (37.1 C) (Oral)   Ht 5\' 3"  (1.6 m)   Wt 149 lb 8 oz (67.8 kg)   SpO2 100%   BMI 26.48 kg/m    Physical Exam Vitals reviewed.  Constitutional:      General: She is not in acute distress.    Appearance: She is well-developed.  HENT:     Head: Normocephalic and atraumatic.  Eyes:     General: No scleral icterus.    Conjunctiva/sclera: Conjunctivae normal.  Cardiovascular:     Rate and Rhythm: Normal rate and regular rhythm.  Pulmonary:     Effort: Pulmonary  effort is normal. No respiratory distress.  Musculoskeletal:     Comments: Back: No TTP over bony landmarks Hips: TTP of anterior hip. Limited flexion and external rotation. Postivie FADIR and FABER testing b/l  Skin:    General: Skin is warm and dry.     Findings: No rash.  Neurological:     Mental Status: She is alert and oriented to person, place, and time.  Psychiatric:        Behavior: Behavior normal.       No results found for any visits on 02/02/23.      Assessment & Plan:   Problem List Items Addressed This Visit   None Visit Diagnoses     Adjustment disorder with anxious mood    -  Primary   Bilateral hip pain       Relevant Orders   DG HIP UNILAT WITH PELVIS 2-3 VIEWS LEFT   DG HIP UNILAT WITH PELVIS 2-3 VIEWS RIGHT   Ambulatory referral to Orthopedic Surgery           Hip Pain Chronic bilateral hip pain with decreased range of motion, particularly in external rotation and flexion. Pain is worse on the right side. No history of hip issues in childhood. X-ray in 2019 showed no significant abnormalities. Pain has been progressively worsening over the  past six months. -Order bilateral hip X-rays. -Refer to orthopedics [facility]) for further evaluation and management.  Anxiety Increased anxiety since the birth of her grandchild and upcoming deployment of her husband. Previous positive response to Celexa 20mg . -Start Celexa 20mg  daily with food. -Consider therapy, potentially through [facility].  General Health Maintenance / Followup Plans -Schedule physical in February 2025. -Check A1c in February 2025. -Follow up sooner if needed.        Meds ordered this encounter  Medications   citalopram (CELEXA) 20 MG tablet    Sig: Take 1 tablet (20 mg total) by mouth daily.    Dispense:  30 tablet    Refill:  3    Return in about 4 months (around 06/05/2023) for chronic disease f/u.  Shirlee Latch, MD

## 2023-02-06 ENCOUNTER — Telehealth: Payer: Self-pay

## 2023-02-06 ENCOUNTER — Encounter: Payer: Self-pay | Admitting: Family Medicine

## 2023-02-06 NOTE — Telephone Encounter (Signed)
-----   Message from Shirlee Latch sent at 02/06/2023  9:03 AM EDT ----- Herby Abraham shows arthritis changes of both hips. This is advanced for your age. Recommend seeing Ortho - ok to place referral if patient agrees.

## 2023-02-06 NOTE — Telephone Encounter (Signed)
I left a message for the patient to return my call.

## 2023-02-06 NOTE — Telephone Encounter (Signed)
Ok to place order as requested - hip arthritis.

## 2023-03-09 ENCOUNTER — Ambulatory Visit: Payer: BC Managed Care – PPO | Admitting: Obstetrics

## 2023-03-09 ENCOUNTER — Other Ambulatory Visit (HOSPITAL_COMMUNITY)
Admission: RE | Admit: 2023-03-09 | Discharge: 2023-03-09 | Disposition: A | Discharge status: Home / Self Care | Payer: BC Managed Care – PPO | Source: Ambulatory Visit | Attending: Obstetrics | Admitting: Obstetrics

## 2023-03-09 ENCOUNTER — Encounter: Payer: Self-pay | Admitting: Obstetrics

## 2023-03-09 VITALS — BP 129/67 | HR 95 | Ht 63.0 in | Wt 145.0 lb

## 2023-03-09 DIAGNOSIS — L720 Epidermal cyst: Secondary | ICD-10-CM | POA: Diagnosis not present

## 2023-03-09 DIAGNOSIS — L723 Sebaceous cyst: Secondary | ICD-10-CM | POA: Insufficient documentation

## 2023-03-09 DIAGNOSIS — N907 Vulvar cyst: Secondary | ICD-10-CM

## 2023-03-09 NOTE — Progress Notes (Signed)
    GYNECOLOGY PROGRESS NOTE  Subjective:  PCP: Erasmo Downer, MD  Patient ID: Bethany Edwards, female    DOB: [birth year], 42 y.o.   MRN: [id number]  HPI  Patient is a 42 y.o. [id number] female who presents for cyst removal from her left labia. Was evaluated by Dr. Marice Potter and pt scheduled today for removal. Cyst is bothersome and becomes irritated and painful at times. Can sometimes express thick white material from it. Denies pus or redness.   The following portions of the patient's history were reviewed and updated as appropriate: allergies, current medications, past family history, past medical history, past social history, past surgical history, and problem list.  Review of Systems Pertinent items are noted in HPI.   Objective:   Blood pressure 129/67, pulse 95, height 5\' 3"  (1.6 m), weight 145 lb (65.8 kg). Body mass index is 25.69 kg/m.  General appearance: alert, cooperative, and appears stated age Respiratory: normal work of breathing Pelvic:  left anterior labia with a 1cm sebaceous cyst, no erythema, induration or exudate Extremities: extremities normal, atraumatic, no cyanosis or edema Neurologic: Grossly normal  CYST EXCISION PROCEDURE Diagnosis: Left labial epidermoid cyst Anesthesia: 1% Lidocaine without epinephrine EBL: < 1cc Complications: None  Procedure: After obtaining informed consent, the area was cleaned and anesthetized using 1% lidocaine without epinephrine. The left labia was cleaned in a sterile fashion. An 11 blade scalpel was used to make a 2mm puncture into the cyst and the contents were expressed. The capsule was then grasped with forceps and easily dissected and removed. The overlying incision was then closed using 4-0 Vicryl in a subcuticular fashion. The patient tolerated the procedure well.    Assessment/Plan:   1. Epidermoid cyst of vulva   42yo [id number] with a chronic, irritated epidermoid cyst on her left labia. Excised today as per procedure  note, no complications and pt tolerated well.  -Specimen sent to pathology -Aftercare instructions reviewed and handout into MyChart. Si/Sx of infection or wound disruption reviewed.  -May take Tylenol/Ibuprofen for pain prn.  -Follow up prn.     Julieanne Manson, DO [facility]

## 2023-03-13 LAB — SURGICAL PATHOLOGY

## 2023-03-14 ENCOUNTER — Encounter: Payer: Self-pay | Admitting: Obstetrics

## 2023-03-15 ENCOUNTER — Encounter: Payer: Self-pay | Admitting: Obstetrics

## 2023-03-15 ENCOUNTER — Ambulatory Visit: Payer: BC Managed Care – PPO | Admitting: Obstetrics

## 2023-03-15 VITALS — BP 130/80 | HR 88 | Ht 63.0 in | Wt 151.0 lb

## 2023-03-15 DIAGNOSIS — T8149XA Infection following a procedure, other surgical site, initial encounter: Secondary | ICD-10-CM | POA: Diagnosis not present

## 2023-03-15 DIAGNOSIS — N762 Acute vulvitis: Secondary | ICD-10-CM | POA: Diagnosis not present

## 2023-03-15 DIAGNOSIS — N907 Vulvar cyst: Secondary | ICD-10-CM

## 2023-03-15 MED ORDER — FLUCONAZOLE 150 MG PO TABS: 150.0000 mg | ORAL_TABLET | ORAL | 0 refills | Status: DC

## 2023-03-15 MED ORDER — SULFAMETHOXAZOLE-TRIMETHOPRIM 800-160 MG PO TABS: 1.0000 | ORAL_TABLET | Freq: Two times a day (BID) | ORAL | 1 refills | Status: DC

## 2023-03-15 NOTE — Progress Notes (Signed)
    GYNECOLOGY PROGRESS NOTE  Subjective:  PCP: Erasmo Downer, MD  Patient ID: Bethany Edwards, female    DOB: 05/31/1980, 43 y.o.   MRN: [id number]  HPI  Patient is a 42 y.o. [id number] female who presents for follow up from a removal of a Epidermoid cyst of vulva 1 week ago. Pt states it recently became more painful and started leaking yellow/greenish fluids, is becoming hard like a knot. No fever and has closure has not re-opened.   The following portions of the patient's history were reviewed and updated as appropriate: allergies, current medications, past family history, past medical history, past social history, past surgical history, and problem list.  Review of Systems Pertinent items noted in HPI and remainder of comprehensive ROS otherwise negative.   Objective:   Blood pressure 130/80, pulse 88, height 5\' 3"  (1.6 m), weight 151 lb (68.5 kg). Body mass index is 26.75 kg/m.   General appearance: alert, cooperative, and appears stated age Respiratory: normal work of breathing Pelvic:  left anterior labia with a well healed incision, surrounding erythema and is indurated and tender. No fluctuance or exudate. Extremities: extremities normal, atraumatic, no cyanosis or edema Neurologic: Grossly normal   Assessment/Plan:   1. Epidermoid cyst of vulva   2. Cellulitis, wound, post-operative   -Start Bactrim DS BID x 7 days; also sending Diflucan x 2, pt prone to post-abx candidiasis -As we go into the holiday wknd, if after 48-72 hrs of taking the abx, pain is worsening, should seek care through outside source. -Hygiene precautions reviewed, wash twice daily and keep clean and dry.  -Follow up prn   Julieanne Manson, DO [facility]

## 2023-04-03 NOTE — Telephone Encounter (Signed)
Should she try the medication first?

## 2023-04-04 ENCOUNTER — Encounter: Payer: Self-pay | Admitting: Family Medicine

## 2023-04-04 NOTE — Telephone Encounter (Signed)
 Care team updated and letter sent for eye exam notes.

## 2023-04-06 LAB — HM DIABETES EYE EXAM

## 2023-05-02 ENCOUNTER — Ambulatory Visit (INDEPENDENT_AMBULATORY_CARE_PROVIDER_SITE_OTHER): Admitting: Dermatology

## 2023-05-02 DIAGNOSIS — L821 Other seborrheic keratosis: Secondary | ICD-10-CM

## 2023-05-02 DIAGNOSIS — L814 Other melanin hyperpigmentation: Secondary | ICD-10-CM | POA: Diagnosis not present

## 2023-05-02 DIAGNOSIS — L719 Rosacea, unspecified: Secondary | ICD-10-CM

## 2023-05-02 DIAGNOSIS — W908XXA Exposure to other nonionizing radiation, initial encounter: Secondary | ICD-10-CM | POA: Diagnosis not present

## 2023-05-02 DIAGNOSIS — D1801 Hemangioma of skin and subcutaneous tissue: Secondary | ICD-10-CM

## 2023-05-02 DIAGNOSIS — L578 Other skin changes due to chronic exposure to nonionizing radiation: Secondary | ICD-10-CM | POA: Diagnosis not present

## 2023-05-02 DIAGNOSIS — L682 Localized hypertrichosis: Secondary | ICD-10-CM

## 2023-05-02 DIAGNOSIS — L689 Hypertrichosis, unspecified: Secondary | ICD-10-CM

## 2023-05-02 DIAGNOSIS — Z1283 Encounter for screening for malignant neoplasm of skin: Secondary | ICD-10-CM | POA: Diagnosis not present

## 2023-05-02 DIAGNOSIS — I8393 Asymptomatic varicose veins of bilateral lower extremities: Secondary | ICD-10-CM

## 2023-05-02 DIAGNOSIS — Z7189 Other specified counseling: Secondary | ICD-10-CM

## 2023-05-02 DIAGNOSIS — I781 Nevus, non-neoplastic: Secondary | ICD-10-CM

## 2023-05-02 NOTE — Progress Notes (Signed)
 Follow-Up Visit   Subjective  Bethany Edwards is a 43 y.o. female who presents for the following: Skin Cancer Screening and Full Body Skin Exam Hx of rosacea metronidazole  , hx of isk vs wart at right mid cheek has since resolved   Patient reports some rough spots at right thigh, bumps at left cheek.    The patient presents for Total-Body Skin Exam (TBSE) for skin cancer screening and mole check. The patient has spots, moles and lesions to be evaluated, some may be new or changing and the patient may have concern these could be cancer.    The following portions of the chart were reviewed this encounter and updated as appropriate: medications, allergies, medical history  Review of Systems:  No other skin or systemic complaints except as noted in HPI or Assessment and Plan.  Objective  Well appearing patient in no apparent distress; mood and affect are within normal limits.  A full examination was performed including scalp, head, eyes, ears, nose, lips, neck, chest, axillae, abdomen, back, buttocks, bilateral upper extremities, bilateral lower extremities, hands, feet, fingers, toes, fingernails, and toenails. All findings within normal limits unless otherwise noted below.   Relevant physical exam findings are noted in the Assessment and Plan.    Assessment & Plan   SKIN CANCER SCREENING PERFORMED TODAY.  ACTINIC DAMAGE - Chronic condition, secondary to cumulative UV/sun exposure - diffuse scaly erythematous macules with underlying dyspigmentation - Recommend daily broad spectrum sunscreen SPF 30+ to sun-exposed areas, reapply every 2 hours as needed.  - Staying in the shade or wearing long sleeves, sun glasses (UVA+UVB protection) and wide brim hats (4-inch brim around the entire circumference of the hat) are also recommended for sun protection.  - Call for new or changing lesions.  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 Larger vessels may be covered if symptomatic and we would refer to vascular surgeon if treatment desired.   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     Chronic and persistent condition with duration or expected duration over one year. Condition is improving with treatment but not currently at goal.      Rosacea is a chronic progressive skin condition usually affecting the face of adults, causing redness and/or acne bumps. It is treatable but not curable. It sometimes affects the eyes (ocular rosacea) as well. It may respond to topical and/or systemic medication and can flare with stress, sun exposure, alcohol, exercise, topical steroids (including hydrocortisone/cortisone 10) and some foods.  Daily application of broad spectrum spf 30+ sunscreen to face is recommended to reduce flares.     Treatment Plan Continue  metronidazole  1 % gel qd/bid daily  Spf daily  Counseling for BBL / IPL / Laser and Coordination of Care Discussed the treatment option  of Broad Band Light (BBL) /Intense Pulsed Light (IPL)/ Laser for skin discoloration, including brown spots and redness.  Typically we recommend at least 1-3 treatment sessions about 5-8 weeks apart for best results.  Cannot have tanned skin when BBL performed, and regular use of sunscreen/photoprotection is advised after the procedure to help maintain results. The patient's condition may also require maintenance treatments in the future.  The fee for BBL / laser treatments is $350 per treatment session for the whole face.  A fee can be quoted for other parts of the body.  Insurance typically does not pay for BBL/laser treatments and therefore the fee is an out-of-pocket cost. Recommend prophylactic valtrex  treatment. Once scheduled for procedure, will send Rx in prior to patient's appointment.    Return in about 1 year (around 05/01/2024) for TBSE.  I, Eleanor Blush, CMA, am acting as scribe for Rexene Rattler, MD.   Documentation: I have reviewed the above documentation for accuracy and completeness, and I agree with the above.  Rexene Rattler, MD

## 2023-05-02 NOTE — Patient Instructions (Addendum)
 [facility] - does laser hair removal owned by Sherr[facility] here  Recommend 6 sessions [phone]  Recommend continue using metronidazole  0.75 cream to nose and cheeks nightly    Seborrheic Keratosis  What causes seborrheic keratoses? Seborrheic keratoses are harmless, common skin growths that first appear during adult life.  As time goes by, more growths appear.  Some people may develop a large number of them.  Seborrheic keratoses appear on both covered and uncovered body parts.  They are not caused by sunlight.  The tendency to develop seborrheic keratoses can be inherited.  They vary in color from skin-colored to gray, brown, or even black.  They can be either smooth or have a rough, warty surface.   Seborrheic keratoses are superficial and look as if they were stuck on the skin.  Under the microscope this type of keratosis looks like layers upon layers of skin.  That is why at times the top layer may seem to fall off, but the rest of the growth remains and re-grows.    Treatment Seborrheic keratoses do not need to be treated, but can easily be removed in the office.  Seborrheic keratoses often cause symptoms when they rub on clothing or jewelry.  Lesions can be in the way of shaving.  If they become inflamed, they can cause itching, soreness, or burning.  Removal of a seborrheic keratosis can be accomplished by freezing, burning, or surgery. If any spot bleeds, scabs, or grows rapidly, please return to have it checked, as these can be an indication of a skin cancer.       Melanoma ABCDEs  Melanoma is the most dangerous type of skin cancer, and is the leading cause of death from skin disease.  You are more likely to develop melanoma if you: Have light-colored skin, light-colored eyes, or red or blond hair Spend a lot of time in the sun Tan regularly, either outdoors or in a tanning bed Have had blistering sunburns, especially during childhood Have a close family member who has had a  melanoma Have atypical moles or large birthmarks  Early detection of melanoma is key since treatment is typically straightforward and cure rates are extremely high if we catch it early.   The first sign of melanoma is often a change in a mole or a new dark spot.  The ABCDE system is a way of remembering the signs of melanoma.  A for asymmetry:  The two halves do not match. B for border:  The edges of the growth are irregular. C for color:  A mixture of colors are present instead of an even brown color. D for diameter:  Melanomas are usually (but not always) greater than 6mm - the size of a pencil eraser. E for evolution:  The spot keeps changing in size, shape, and color.  Please check your skin once per month between visits. You can use a small mirror in front and a large mirror behind you to keep an eye on the back side or your body.   If you see any new or changing lesions before your next follow-up, please call to schedule a visit.  Please continue daily skin protection including broad spectrum sunscreen SPF 30+ to sun-exposed areas, reapplying every 2 hours as needed when you're outdoors.   Staying in the shade or wearing long sleeves, sun glasses (UVA+UVB protection) and wide brim hats (4-inch brim around the entire circumference of the hat) are also recommended for sun protection.     Due  to recent changes in healthcare laws, you may see results of your pathology and/or laboratory studies on MyChart before the doctors have had a chance to review them. We understand that in some cases there may be results that are confusing or concerning to you. Please understand that not all results are received at the same time and often the doctors may need to interpret multiple results in order to provide you with the best plan of care or course of treatment. Therefore, we ask that you please give us  2 business days to thoroughly review all your results before contacting the office for clarification.  Should we see a critical lab result, you will be contacted sooner.   If You Need Anything After Your Visit  If you have any questions or concerns for your doctor, please call our main line at [phone] and press option 4 to reach your doctor's medical assistant. If no one answers, please leave a voicemail as directed and we will return your call as soon as possible. Messages left after 4 pm will be answered the following business day.   You may also send us  a message via MyChart. We typically respond to MyChart messages within 1-2 business days.  For prescription refills, please ask your pharmacy to contact our office. Our fax number is [phone].  If you have an urgent issue when the clinic is closed that cannot wait until the next business day, you can page your doctor at the number below.    Please note that while we do our best to be available for urgent issues outside of office hours, we are not available 24/7.   If you have an urgent issue and are unable to reach us , you may choose to seek medical care at your doctor's office, retail clinic, urgent care center, or emergency room.  If you have a medical emergency, please immediately call 911 or go to the emergency department.  Pager Numbers  - Dr. Hester: [phone]  - Dr. Jackquline: [phone]  - Dr. Claudene: [phone]   In the event of inclement weather, please call our main line at [phone] for an update on the status of any delays or closures.  Dermatology Medication Tips: Please keep the boxes that topical medications come in in order to help keep track of the instructions about where and how to use these. Pharmacies typically print the medication instructions only on the boxes and not directly on the medication tubes.   If your medication is too expensive, please contact our office at [phone] option 4 or send us  a message through MyChart.   We are unable to tell what your co-pay for medications will be  in advance as this is different depending on your insurance coverage. However, we may be able to find a substitute medication at lower cost or fill out paperwork to get insurance to cover a needed medication.   If a prior authorization is required to get your medication covered by your insurance company, please allow us  1-2 business days to complete this process.  Drug prices often vary depending on where the prescription is filled and some pharmacies may offer cheaper prices.  The website www.goodrx.com contains coupons for medications through different pharmacies. The prices here do not account for what the cost may be with help from insurance (it may be cheaper with your insurance), but the website can give you the price if you did not use any insurance.  - You can print the associated coupon and take it  with your prescription to the pharmacy.  - You may also stop by our office during regular business hours and pick up a GoodRx coupon card.  - If you need your prescription sent electronically to a different pharmacy, notify our office through [facility] or by phone at [phone] option 4.     Si Usted Necesita Algo Despus de Su Visita  Tambin puede enviarnos un mensaje a travs de Clinical Cytogeneticist. Por lo general respondemos a los mensajes de MyChart en el transcurso de 1 a 2 das hbiles.  Para renovar recetas, por favor pida a su farmacia que se ponga en contacto con nuestra oficina. Randi lakes de fax es [place] [phone].  Si tiene un asunto urgente cuando la clnica est cerrada y que no puede esperar hasta el siguiente da hbil, puede llamar/localizar a su doctor(a) al nmero que aparece a continuacin.   Por favor, tenga en cuenta que aunque hacemos todo lo posible para estar disponibles para asuntos urgentes fuera del horario de [place], no estamos disponibles las 24 horas del da, los 7 [address]  [address] de la [place].   Si tiene un problema urgente y no puede comunicarse con nosotros,  puede optar por buscar atencin mdica  en el consultorio de su doctor(a), en una clnica privada, en un centro de atencin urgente o en una sala de emergencias.  Si tiene engineer, drilling, por favor llame inmediatamente al 911 o vaya a la sala de emergencias.  Nmeros de bper  - Dr. Hester: [phone]  - Dra. Jackquline: [phone]  - Dr. Claudene: [phone]   En caso de inclemencias del tiempo, por favor llame a landry capes principal al [phone] para una actualizacin sobre el [place] de cualquier retraso o cierre.  Consejos para la medicacin en dermatologa: Por favor, guarde las cajas en las que vienen los medicamentos de uso tpico para ayudarle a seguir las instrucciones sobre dnde y cmo usarlos. Las farmacias generalmente imprimen las instrucciones del medicamento slo en las cajas y no directamente en los tubos del [place].   Si su medicamento es muy caro, por favor, pngase en contacto con landry rieger llamando al [phone] y presione la opcin 4 o envenos un mensaje a travs de Clinical Cytogeneticist.   No podemos decirle cul ser su copago por los medicamentos por adelantado ya que esto es diferente dependiendo de la cobertura de su seguro. Sin embargo, es posible que podamos encontrar un medicamento sustituto a audiological scientist un formulario para que el seguro cubra el medicamento que se considera necesario.   Si se requiere una autorizacin previa para que su compaa de seguros cubra su medicamento, por favor permtanos de 1 a 2 das hbiles para completar este proceso.  Los precios de los medicamentos varan con frecuencia dependiendo del environmental consultant de dnde se surte la receta y alguna farmacias pueden ofrecer precios ms baratos.  El sitio web www.goodrx.com tiene cupones para medicamentos de health and safety inspector. Los precios aqu no tienen en cuenta lo que podra costar con la ayuda del seguro (puede ser ms barato con su seguro), pero el sitio web puede darle  el precio si no utiliz tourist information centre manager.  - Puede imprimir el cupn correspondiente y llevarlo con su receta a la farmacia.  - Tambin puede pasar por nuestra oficina durante el horario de atencin regular y education officer, museum una tarjeta de cupones de GoodRx.  - Si necesita que su receta se enve electrnicamente a psychiatrist, informe a nuestra oficina a travs de MyChart de  [place] o por telfono llamando al [phone] y presione la opcin 4.

## 2023-06-08 ENCOUNTER — Ambulatory Visit: Payer: BC Managed Care – PPO | Admitting: Family Medicine

## 2023-06-12 ENCOUNTER — Ambulatory Visit: Admitting: Family Medicine

## 2023-06-12 ENCOUNTER — Encounter: Payer: Self-pay | Admitting: Family Medicine

## 2023-06-12 VITALS — BP 111/68 | HR 88 | Ht 63.0 in | Wt 153.0 lb

## 2023-06-12 DIAGNOSIS — R6889 Other general symptoms and signs: Secondary | ICD-10-CM | POA: Diagnosis not present

## 2023-06-12 DIAGNOSIS — E1165 Type 2 diabetes mellitus with hyperglycemia: Secondary | ICD-10-CM

## 2023-06-12 DIAGNOSIS — F5104 Psychophysiologic insomnia: Secondary | ICD-10-CM

## 2023-06-12 DIAGNOSIS — I152 Hypertension secondary to endocrine disorders: Secondary | ICD-10-CM

## 2023-06-12 DIAGNOSIS — E1159 Type 2 diabetes mellitus with other circulatory complications: Secondary | ICD-10-CM

## 2023-06-12 DIAGNOSIS — E559 Vitamin D deficiency, unspecified: Secondary | ICD-10-CM | POA: Diagnosis not present

## 2023-06-12 DIAGNOSIS — E1169 Type 2 diabetes mellitus with other specified complication: Secondary | ICD-10-CM

## 2023-06-12 DIAGNOSIS — E785 Hyperlipidemia, unspecified: Secondary | ICD-10-CM

## 2023-06-12 DIAGNOSIS — Z7985 Long-term (current) use of injectable non-insulin antidiabetic drugs: Secondary | ICD-10-CM

## 2023-06-12 DIAGNOSIS — F419 Anxiety disorder, unspecified: Secondary | ICD-10-CM

## 2023-06-12 LAB — POC COVID19/FLU A&B COMBO
Covid Antigen, POC: NEGATIVE
Influenza A Antigen, POC: NEGATIVE
Influenza B Antigen, POC: NEGATIVE

## 2023-06-12 NOTE — Progress Notes (Signed)
 Acute visit   Patient: Bethany Edwards   DOB: 1980-10-22   43 y.o. Female  MRN: [id number]  Chief Complaint  Patient presents with   Sinus Problem    Possible sinus infection, she tested neg for covid, cough, fatigue, fever, sore throat from coughing, and chest tightness this all started yesterday   Health Maintenance    Vaccine are not in clinic during her visit     Diabetes   Subjective    Discussed the use of AI scribe software for clinical note transcription with the patient, who gave verbal consent to proceed.  History of Present Illness   A 43 year old patient with a history of hypertension, type two diabetes, hyperlipidemia, insomnia, anxiety, and low vitamin D presents with sudden onset of flu-like symptoms. The patient reports waking up feeling unwell, with symptoms including cough, fatigue, fever, sore throat, and chest tightness. The patient describes the chest tightness as similar to when she had bronchitis as a smoker. The patient also reports feeling "hit by a truck," a common description of the sudden onset and severe fatigue associated with the flu. 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 The patient also mentions difficulty with medication adherence, specifically forgetting to take her Celexa medication regularly despite wanting to be compliant.          06/12/2023    1:54 PM 02/02/2023    3:25 PM 12/01/2022    2:36 PM 08/18/2022    1:51 PM 04/06/2022    2:14 PM  Depression screen PHQ 2/9  Decreased Interest 0 0 0 0 0  Down, Depressed, Hopeless 0 0 0 0 0  PHQ - 2 Score 0 0 0 0 0  Altered sleeping  2  2 2   Tired, decreased energy  0  2 0  Change in appetite  0  0 0  Feeling bad or failure about yourself   0  0 0  Trouble concentrating  0  0 0  Moving slowly or fidgety/restless  0  0 0  Suicidal thoughts  0  0 0  PHQ-9 Score  2  4 2   Difficult doing  work/chores  Not difficult at all  Not difficult at all Not difficult at all       06/12/2023    1:57 PM 02/02/2023    3:25 PM  GAD 7 : Generalized Anxiety Score  Nervous, Anxious, on Edge 0 2  Control/stop worrying 1 2  Worry too much - different things 1 2  Trouble relaxing 1 2  Restless 0 0  Easily annoyed or irritable 0 0  Afraid - awful might happen 0 2  Total GAD 7 Score 3 10  Anxiety Difficulty Not difficult at all Not difficult at all     Review of Systems  Objective    BP 111/68 (BP Location: Left Arm, Patient Position: Sitting)   Pulse 88   Ht 5\' 3"  (1.6 m)   Wt 153 lb (69.4 kg)   SpO2 100%   BMI 27.10 kg/m   Physical Exam Vitals reviewed.  Constitutional:      General: She is not in acute distress.    Appearance: Normal appearance. She is well-developed. She is not diaphoretic.  HENT:     Head: Normocephalic and atraumatic.     Right Ear: Tympanic membrane, ear canal and external ear normal.  Left Ear: Tympanic membrane, ear canal and external ear normal.     Nose: Congestion present.     Mouth/Throat:     Mouth: Mucous membranes are moist.     Pharynx: Oropharynx is clear. No oropharyngeal exudate.  Eyes:     General: No scleral icterus.    Conjunctiva/sclera: Conjunctivae normal.     Pupils: Pupils are equal, round, and reactive to light.  Cardiovascular:     Rate and Rhythm: Normal rate and regular rhythm.     Heart sounds: Normal heart sounds. No murmur heard. Pulmonary:     Effort: Pulmonary effort is normal. No respiratory distress.     Breath sounds: Normal breath sounds. No wheezing or rales.  Musculoskeletal:     Cervical back: Neck supple.     Right lower leg: No edema.     Left lower leg: No edema.  Lymphadenopathy:     Cervical: No cervical adenopathy.  Skin:    General: Skin is warm and dry.     Findings: No rash.  Neurological:     Mental Status: She is alert.       Results for orders placed or performed in visit on  06/12/23  POC Covid19/Flu A&B Antigen  Result Value Ref Range   Influenza A Antigen, POC Negative Negative   Influenza B Antigen, POC Negative Negative   Covid Antigen, POC Negative Negative    Assessment & Plan     Problem List Items Addressed This Visit       Cardiovascular and Mediastinum   Hypertension associated with diabetes (HCC)   Blood pressure is well-controlled. Continue lifestlye management      Relevant Orders   Comprehensive metabolic panel     Endocrine   Type 2 diabetes mellitus with hyperglycemia, without long-term current use of insulin (HCC) - Primary   Follow-up for diabetes management. Due for HbA1c test. On Ozempic 1 mg weekly. Discussed importance of regular monitoring of HbA1c, kidney function, and liver function. - Order HbA1c test - Order kidney function test - Order liver function test - Order urine test for kidney function      Relevant Orders   Hemoglobin A1c   Microalbumin / creatinine urine ratio   Hyperlipidemia associated with type 2 diabetes mellitus (HCC)   On Crestor 10 mg daily. Due for cholesterol test. - Order cholesterol test      Relevant Orders   Comprehensive metabolic panel   Lipid panel     Other   Anxiety   On Celexa 20 mg daily but reports inconsistent adherence (3-4 times a week). Experiences nausea if taken during the day, so takes it at night. Discussed strategies to improve adherence, including setting reminders or using an app. - Advise to set reminders or use an app to improve medication adherence      Avitaminosis D   Vitamin D was low over a year ago. Due for re-evaluation. - Order vitamin D level test      Relevant Orders   VITAMIN D 25 Hydroxy (Vit-D Deficiency, Fractures)   Insomnia   Uses trazodone 25-50 mg QHS PRN for insomnia.      Other Visit Diagnoses       Flu-like symptoms       Relevant Orders   POC Covid19/Flu A&B Antigen (Completed)          Influenza-like Illness Acute onset of  fever, cough, fatigue, sore throat, and chest tightness consistent with influenza. Negative home COVID-19 test. Differential includes  influenza and other viral respiratory infections. Discussed Tamiflu for positive flu test, including symptom reduction, illness duration, and hospitalization risk. Possible side effects: stomach upset. Symptom management options: honey for cough, Delsym, DayQuil, or NyQuil. - Perform flu and COVID-19 swab - Prescribe Tamiflu 75 mg BID for 5 days if flu test is positive (it was negative) - Advise on symptom management: honey for cough, Delsym, DayQuil, or NyQuil as needed  General Health Maintenance Received a flu shot this year.  Follow-up - Schedule follow-up appointment in six months.       No orders of the defined types were placed in this encounter.    Return in about 6 months (around 12/10/2023) for CPE.      Shirlee Latch, MD  [facility] [phone] (phone) [phone] (fax)  [facility]

## 2023-06-12 NOTE — Assessment & Plan Note (Signed)
 Vitamin D was low over a year ago. Due for re-evaluation. - Order vitamin D level test

## 2023-06-12 NOTE — Assessment & Plan Note (Signed)
 Uses trazodone 25-50 mg QHS PRN for insomnia.

## 2023-06-12 NOTE — Assessment & Plan Note (Signed)
 Blood pressure is well-controlled. Continue lifestlye management

## 2023-06-12 NOTE — Assessment & Plan Note (Signed)
 On Crestor 10 mg daily. Due for cholesterol test. - Order cholesterol test

## 2023-06-12 NOTE — Assessment & Plan Note (Signed)
 On Celexa 20 mg daily but reports inconsistent adherence (3-4 times a week). Experiences nausea if taken during the day, so takes it at night. Discussed strategies to improve adherence, including setting reminders or using an app. - Advise to set reminders or use an app to improve medication adherence

## 2023-06-12 NOTE — Assessment & Plan Note (Signed)
 Follow-up for diabetes management. Due for HbA1c test. On Ozempic 1 mg weekly. Discussed importance of regular monitoring of HbA1c, kidney function, and liver function. - Order HbA1c test - Order kidney function test - Order liver function test - Order urine test for kidney function

## 2023-06-13 ENCOUNTER — Encounter: Payer: Self-pay | Admitting: Family Medicine

## 2023-06-13 DIAGNOSIS — E1165 Type 2 diabetes mellitus with hyperglycemia: Secondary | ICD-10-CM

## 2023-06-13 LAB — COMPREHENSIVE METABOLIC PANEL
ALT: 18 [IU]/L (ref 0–32)
AST: 17 [IU]/L (ref 0–40)
Albumin: 4.5 g/dL (ref 3.9–4.9)
Alkaline Phosphatase: 66 [IU]/L (ref 44–121)
BUN/Creatinine Ratio: 11 (ref 9–23)
BUN: 8 mg/dL (ref 6–24)
Bilirubin Total: 0.3 mg/dL (ref 0.0–1.2)
CO2: 26 mmol/L (ref 20–29)
Calcium: 9 mg/dL (ref 8.7–10.2)
Chloride: 103 mmol/L (ref 96–106)
Creatinine, Ser: 0.74 mg/dL (ref 0.57–1.00)
Globulin, Total: 2.2 g/dL (ref 1.5–4.5)
Glucose: 105 mg/dL — ABNORMAL HIGH (ref 70–99)
Potassium: 4.4 mmol/L (ref 3.5–5.2)
Sodium: 142 mmol/L (ref 134–144)
Total Protein: 6.7 g/dL (ref 6.0–8.5)
eGFR: 102 mL/min/{1.73_m2} (ref >59)

## 2023-06-13 LAB — MICROALBUMIN / CREATININE URINE RATIO
Creatinine, Urine: 69.7 mg/dL
Microalb/Creat Ratio: 7 mg/g{creat} (ref 0–29)
Microalbumin, Urine: 4.7 ug/mL

## 2023-06-13 LAB — HEMOGLOBIN A1C
Est. average glucose Bld gHb Est-mCnc: 148 mg/dL
Hgb A1c MFr Bld: 6.8 % — ABNORMAL HIGH (ref 4.8–5.6)

## 2023-06-13 LAB — VITAMIN D 25 HYDROXY (VIT D DEFICIENCY, FRACTURES): Vit D, 25-Hydroxy: 30.3 ng/mL (ref 30.0–100.0)

## 2023-06-13 LAB — LIPID PANEL
Chol/HDL Ratio: 3.7 {ratio} (ref 0.0–4.4)
Cholesterol, Total: 122 mg/dL (ref 100–199)
HDL: 33 mg/dL — ABNORMAL LOW (ref >39)
LDL Chol Calc (NIH): 63 mg/dL (ref 0–99)
Triglycerides: 151 mg/dL — ABNORMAL HIGH (ref 0–149)
VLDL Cholesterol Cal: 26 mg/dL (ref 5–40)

## 2023-06-15 MED ORDER — SEMAGLUTIDE (2 MG/DOSE) 8 MG/3ML ~~LOC~~ SOPN: 2.0000 mg | PEN_INJECTOR | SUBCUTANEOUS | 0 refills | Status: DC

## 2023-06-23 ENCOUNTER — Other Ambulatory Visit: Payer: Self-pay | Admitting: Family Medicine

## 2023-06-23 NOTE — Telephone Encounter (Signed)
 Note says patient request to take with ozempic increase

## 2023-06-26 MED ORDER — ONDANSETRON HCL 4 MG PO TABS: 4.0000 mg | ORAL_TABLET | Freq: Three times a day (TID) | ORAL | 0 refills | Status: AC | PRN

## 2023-07-04 ENCOUNTER — Other Ambulatory Visit: Payer: Self-pay | Admitting: Family Medicine

## 2023-07-04 DIAGNOSIS — Z1231 Encounter for screening mammogram for malignant neoplasm of breast: Secondary | ICD-10-CM

## 2023-07-20 ENCOUNTER — Ambulatory Visit
Admission: RE | Admit: 2023-07-20 | Discharge: 2023-07-20 | Disposition: A | Discharge status: Home / Self Care | Source: Ambulatory Visit | Attending: Family Medicine | Admitting: Family Medicine

## 2023-07-20 DIAGNOSIS — Z1231 Encounter for screening mammogram for malignant neoplasm of breast: Secondary | ICD-10-CM | POA: Insufficient documentation

## 2023-07-22 ENCOUNTER — Other Ambulatory Visit: Payer: Self-pay | Admitting: Physician Assistant

## 2023-07-22 DIAGNOSIS — G4700 Insomnia, unspecified: Secondary | ICD-10-CM

## 2023-07-24 ENCOUNTER — Encounter: Payer: Self-pay | Admitting: Family Medicine

## 2023-08-21 ENCOUNTER — Other Ambulatory Visit: Payer: Self-pay | Admitting: Family Medicine

## 2023-08-21 DIAGNOSIS — E1165 Type 2 diabetes mellitus with hyperglycemia: Secondary | ICD-10-CM

## 2023-08-23 ENCOUNTER — Other Ambulatory Visit: Payer: Self-pay | Admitting: Orthopedic Surgery

## 2023-08-23 DIAGNOSIS — M25551 Pain in right hip: Secondary | ICD-10-CM

## 2023-09-21 ENCOUNTER — Other Ambulatory Visit

## 2023-09-21 ENCOUNTER — Inpatient Hospital Stay: Admission: RE | Admit: 2023-09-21 | Source: Ambulatory Visit

## 2023-10-19 ENCOUNTER — Ambulatory Visit
Admission: RE | Admit: 2023-10-19 | Discharge: 2023-10-19 | Disposition: A | Discharge status: Home / Self Care | Source: Ambulatory Visit | Attending: Orthopedic Surgery | Admitting: Orthopedic Surgery

## 2023-10-19 DIAGNOSIS — M25551 Pain in right hip: Secondary | ICD-10-CM

## 2023-10-19 MED ORDER — IOPAMIDOL (ISOVUE-M 200) INJECTION 41%
10.0000 mL | Freq: Once | INTRAMUSCULAR | Status: AC
Start: 2023-10-19 — End: 2023-10-19
  Administered 2023-10-19: 10 mL via INTRA_ARTICULAR

## 2023-11-16 ENCOUNTER — Ambulatory Visit: Admitting: Family Medicine

## 2023-11-30 ENCOUNTER — Ambulatory Visit: Admitting: Family Medicine

## 2023-12-07 ENCOUNTER — Ambulatory Visit (INDEPENDENT_AMBULATORY_CARE_PROVIDER_SITE_OTHER): Admitting: Family Medicine

## 2023-12-07 ENCOUNTER — Encounter: Payer: Self-pay | Admitting: Family Medicine

## 2023-12-07 VITALS — BP 110/74 | HR 84 | Temp 98.2°F | Ht 68.0 in | Wt 147.0 lb

## 2023-12-07 DIAGNOSIS — E1165 Type 2 diabetes mellitus with hyperglycemia: Secondary | ICD-10-CM

## 2023-12-07 DIAGNOSIS — E1169 Type 2 diabetes mellitus with other specified complication: Secondary | ICD-10-CM | POA: Diagnosis not present

## 2023-12-07 DIAGNOSIS — E1159 Type 2 diabetes mellitus with other circulatory complications: Secondary | ICD-10-CM

## 2023-12-07 DIAGNOSIS — I152 Hypertension secondary to endocrine disorders: Secondary | ICD-10-CM

## 2023-12-07 DIAGNOSIS — E785 Hyperlipidemia, unspecified: Secondary | ICD-10-CM

## 2023-12-07 DIAGNOSIS — Z23 Encounter for immunization: Secondary | ICD-10-CM | POA: Diagnosis not present

## 2023-12-07 DIAGNOSIS — Z7985 Long-term (current) use of injectable non-insulin antidiabetic drugs: Secondary | ICD-10-CM

## 2023-12-07 LAB — POCT GLYCOSYLATED HEMOGLOBIN (HGB A1C): Hemoglobin A1C: 6.5 % — AB (ref 4.0–5.6)

## 2023-12-07 MED ORDER — ROSUVASTATIN CALCIUM 10 MG PO TABS
10.0000 mg | ORAL_TABLET | Freq: Every day | ORAL | 3 refills | Status: AC
Start: 2023-12-07 — End: ?

## 2023-12-07 NOTE — Assessment & Plan Note (Addendum)
 Currently well controlled on medication regimen. Denies any symptoms or adverse medication side effects. Last A1C was stable. - Continue Ozempic  2mg  weekly - Order POCT A1C (resulted 6.5) - Order CMP at next visit - Perform diabetic foot exams (normal)

## 2023-12-07 NOTE — Assessment & Plan Note (Signed)
 Currently well controlled without medication. Denies any symptoms. Last CMP was stable. - Continue lifestyle modifications - Order CMP at next visit

## 2023-12-07 NOTE — Assessment & Plan Note (Signed)
 Currently well controlled on medication regimen. Denies any symptoms or adverse medication side effects. Last FLP was stable. - Continue rosuvastatin  10 mg daily - Order FLP at next visit

## 2023-12-07 NOTE — Progress Notes (Unsigned)
 Established Patient Office Visit  Subjective   Patient ID: Bethany Edwards, female    DOB: 18-Feb-1981  Age: 43 y.o. MRN: [id number]  Chief Complaint  Patient presents with   Diabetes    Bethany Edwards is a 43 yo F with a history HTN, T2DM, and HLD who presents to the clinic for follow up on her chronic conditions.  On interview, she reports good control of her diabetes with Ozempic  and lifestyle modifications. She is worried about the effectiveness of Ozempic  at controlling her diabetes. She would like to discuss the efficacies of Ozempic  and Mounjaro for diabetes control. She also reports good control of her blood pressure with lifestyle modifications and cholesterol with rosuvastatin . She reports no other concerns today.  She is interested in addressing health maintenance goals today.    {History (Optional):23778}  ROS    Objective:     BP 110/74 (BP Location: Left Arm, Patient Position: Sitting, Cuff Size: Normal)   Pulse 84   Temp 98.2 F (36.8 C)   Ht 5' 8 (1.727 m)   Wt 147 lb (66.7 kg)   SpO2 100%   BMI 22.35 kg/m  {Vitals History (Optional):23777}  Physical Exam Vitals reviewed.  Constitutional:      General: She is not in acute distress.    Appearance: Normal appearance. She is not ill-appearing or diaphoretic.  HENT:     Head: Normocephalic.     Right Ear: External ear normal.     Left Ear: External ear normal.     Nose: Nose normal.     Mouth/Throat:     Mouth: Mucous membranes are moist.  Eyes:     General: No scleral icterus.    Conjunctiva/sclera: Conjunctivae normal.  Cardiovascular:     Rate and Rhythm: Normal rate and regular rhythm.     Pulses: Normal pulses.     Heart sounds: Normal heart sounds. No murmur heard.    No friction rub. No gallop.  Pulmonary:     Effort: Pulmonary effort is normal. No respiratory distress.     Breath sounds: Normal breath sounds. No wheezing.  Abdominal:     Palpations: Abdomen is soft.  Musculoskeletal:      Cervical back: Normal range of motion.     Right lower leg: No edema.     Left lower leg: No edema.  Skin:    General: Skin is warm and dry.     Capillary Refill: Capillary refill takes less than 2 seconds.  Neurological:     Mental Status: She is alert and oriented to person, place, and time.  Psychiatric:        Mood and Affect: Mood normal.      Results for orders placed or performed in visit on 12/07/23  POCT HgB A1C  Result Value Ref Range   Hemoglobin A1C 6.5 (A) 4.0 - 5.6 %   HbA1c POC (<> result, manual entry)     HbA1c, POC (prediabetic range)     HbA1c, POC (controlled diabetic range)      {Labs (Optional):23779}  The ASCVD Risk score (Arnett DK, et al., 2019) failed to calculate for the following reasons:   The valid total cholesterol range is 130 to 320 mg/dL    Assessment & Plan:   Problem List Items Addressed This Visit     Type 2 diabetes mellitus with hyperglycemia, without long-term current use of insulin (HCC)   Currently well controlled on medication regimen. Denies any symptoms or adverse medication  side effects. Last A1C was stable. - Continue Ozempic  2mg  weekly - Order POCT A1C (resulted 6.5) - Order CMP at next visit - Perform diabetic foot exams (normal)       Relevant Medications   rosuvastatin  (CRESTOR ) 10 MG tablet   Other Relevant Orders   POCT HgB A1C (Completed)   Hyperlipidemia associated with type 2 diabetes mellitus (HCC)   Currently well controlled on medication regimen. Denies any symptoms or adverse medication side effects. Last FLP was stable. - Continue rosuvastatin  10 mg daily - Order FLP at next visit       Relevant Medications   rosuvastatin  (CRESTOR ) 10 MG tablet   Other Relevant Orders   Comprehensive Metabolic Panel (CMET)   Lipid Profile   Hypertension associated with diabetes (HCC) - Primary   Currently well controlled without medication. Denies any symptoms. Last CMP was stable. - Continue lifestyle  modifications - Order CMP at next visit       Relevant Medications   rosuvastatin  (CRESTOR ) 10 MG tablet   Other Relevant Orders   Comprehensive Metabolic Panel (CMET)   Other Visit Diagnoses       Hyperlipidemia LDL goal <70       Relevant Medications   rosuvastatin  (CRESTOR ) 10 MG tablet      General Health Maintenance Discussed wellness and preventative health screenings to achieve health maintenance goals. - Encouraged flu and Covid vaccinations in the fall - Administered pneumonia vaccine - Performed diabetic foot exam with normal findings - Patient reports history of receiving Hep B vaccinations - Schedule colonoscopy, reaching out if referral is needed - Discussed eye exam coming up in the winter - Up to date on other wellness measures and preventative screenings   Return in about 6 months (around 06/08/2024) for CPE.    Bethany Edwards, Medical Student   Patient seen along with MS3 student, Bethany Edwards. I personally evaluated this patient along with the student, and verified all aspects of the history, physical exam, and medical decision making as documented by the student. I agree with the student's documentation and have made all necessary edits.  Bethany Edwards, Bethany HERO, MD, MPH [facility]

## 2023-12-08 ENCOUNTER — Ambulatory Visit: Payer: Self-pay | Admitting: Family Medicine

## 2023-12-08 LAB — COMPREHENSIVE METABOLIC PANEL WITH GFR
ALT: 15 IU/L (ref 0–32)
AST: 15 IU/L (ref 0–40)
Albumin: 4.6 g/dL (ref 3.9–4.9)
Alkaline Phosphatase: 65 IU/L (ref 44–121)
BUN/Creatinine Ratio: 10 (ref 9–23)
BUN: 7 mg/dL (ref 6–24)
Bilirubin Total: 0.3 mg/dL (ref 0.0–1.2)
CO2: 24 mmol/L (ref 20–29)
Calcium: 9.5 mg/dL (ref 8.7–10.2)
Chloride: 101 mmol/L (ref 96–106)
Creatinine, Ser: 0.69 mg/dL (ref 0.57–1.00)
Globulin, Total: 2.3 g/dL (ref 1.5–4.5)
Glucose: 104 mg/dL — ABNORMAL HIGH (ref 70–99)
Potassium: 4.9 mmol/L (ref 3.5–5.2)
Sodium: 139 mmol/L (ref 134–144)
Total Protein: 6.9 g/dL (ref 6.0–8.5)
eGFR: 109 mL/min/1.73 (ref >59)

## 2023-12-08 LAB — LIPID PANEL
Chol/HDL Ratio: 4.9 ratio — ABNORMAL HIGH (ref 0.0–4.4)
Cholesterol, Total: 167 mg/dL (ref 100–199)
HDL: 34 mg/dL — ABNORMAL LOW (ref >39)
LDL Chol Calc (NIH): 109 mg/dL — ABNORMAL HIGH (ref 0–99)
Triglycerides: 133 mg/dL (ref 0–149)
VLDL Cholesterol Cal: 24 mg/dL (ref 5–40)

## 2024-01-10 ENCOUNTER — Encounter: Payer: Self-pay | Admitting: Family Medicine

## 2024-01-10 DIAGNOSIS — Z1211 Encounter for screening for malignant neoplasm of colon: Secondary | ICD-10-CM

## 2024-01-11 NOTE — Telephone Encounter (Signed)
 Ok to send referral for colon cancer screening as requested (put preference in the comments)

## 2024-02-09 ENCOUNTER — Encounter: Payer: Self-pay | Admitting: Family Medicine

## 2024-02-13 ENCOUNTER — Other Ambulatory Visit: Payer: Self-pay | Admitting: Family Medicine

## 2024-02-20 ENCOUNTER — Other Ambulatory Visit: Payer: Self-pay | Admitting: Orthopedic Surgery

## 2024-02-20 DIAGNOSIS — M7521 Bicipital tendinitis, right shoulder: Secondary | ICD-10-CM

## 2024-02-20 DIAGNOSIS — S43421D Sprain of right rotator cuff capsule, subsequent encounter: Secondary | ICD-10-CM

## 2024-02-29 ENCOUNTER — Ambulatory Visit
Admission: RE | Admit: 2024-02-29 | Discharge: 2024-02-29 | Disposition: A | Discharge status: Home / Self Care | Source: Ambulatory Visit | Attending: Orthopedic Surgery | Admitting: Orthopedic Surgery

## 2024-02-29 DIAGNOSIS — S43421D Sprain of right rotator cuff capsule, subsequent encounter: Secondary | ICD-10-CM | POA: Insufficient documentation

## 2024-02-29 DIAGNOSIS — M7521 Bicipital tendinitis, right shoulder: Secondary | ICD-10-CM | POA: Diagnosis present

## 2024-05-07 ENCOUNTER — Encounter: Payer: BC Managed Care – PPO | Admitting: Dermatology

## 2024-05-09 ENCOUNTER — Ambulatory Visit: Admitting: Dermatology

## 2024-05-09 ENCOUNTER — Encounter: Payer: Self-pay | Admitting: Dermatology

## 2024-05-09 DIAGNOSIS — D1801 Hemangioma of skin and subcutaneous tissue: Secondary | ICD-10-CM

## 2024-05-09 DIAGNOSIS — L578 Other skin changes due to chronic exposure to nonionizing radiation: Secondary | ICD-10-CM | POA: Diagnosis not present

## 2024-05-09 DIAGNOSIS — W908XXA Exposure to other nonionizing radiation, initial encounter: Secondary | ICD-10-CM

## 2024-05-09 DIAGNOSIS — L821 Other seborrheic keratosis: Secondary | ICD-10-CM | POA: Diagnosis not present

## 2024-05-09 DIAGNOSIS — L689 Hypertrichosis, unspecified: Secondary | ICD-10-CM

## 2024-05-09 DIAGNOSIS — L682 Localized hypertrichosis: Secondary | ICD-10-CM | POA: Diagnosis not present

## 2024-05-09 DIAGNOSIS — L649 Androgenic alopecia, unspecified: Secondary | ICD-10-CM

## 2024-05-09 DIAGNOSIS — D229 Melanocytic nevi, unspecified: Secondary | ICD-10-CM

## 2024-05-09 DIAGNOSIS — L814 Other melanin hyperpigmentation: Secondary | ICD-10-CM

## 2024-05-09 DIAGNOSIS — L719 Rosacea, unspecified: Secondary | ICD-10-CM | POA: Diagnosis not present

## 2024-05-09 DIAGNOSIS — L820 Inflamed seborrheic keratosis: Secondary | ICD-10-CM | POA: Diagnosis not present

## 2024-05-09 DIAGNOSIS — Z1283 Encounter for screening for malignant neoplasm of skin: Secondary | ICD-10-CM

## 2024-05-09 MED ORDER — METRONIDAZOLE 0.75 % EX CREA: TOPICAL_CREAM | Freq: Two times a day (BID) | CUTANEOUS | 5 refills | Status: AC

## 2024-05-09 NOTE — Progress Notes (Signed)
 "  Follow-Up Visit   Subjective  Bethany Edwards is a 44 y.o. female who presents for the following: Skin Cancer Screening and Full Body Skin Exam. Patient with metronidazole  that she uses prn. New spot at left brow and one to recheck at left inframammary.  The patient presents for Total-Body Skin Exam (TBSE) for skin cancer screening and mole check. The patient has spots, moles and lesions to be evaluated, some may be new or changing and the patient may have concern these could be cancer.    The following portions of the chart were reviewed this encounter and updated as appropriate: medications, allergies, medical history  Review of Systems:  No other skin or systemic complaints except as noted in HPI or Assessment and Plan.  Objective  Well appearing patient in no apparent distress; mood and affect are within normal limits.  A full examination was performed including scalp, head, eyes, ears, nose, lips, neck, chest, axillae, abdomen, back, buttocks, bilateral upper extremities, bilateral lower extremities, hands, feet, fingers, toes, fingernails, and toenails. All findings within normal limits unless otherwise noted below.   Exam of nails limited by presence of nail polish.  Relevant physical exam findings are noted in the Assessment and Plan.  Left Inframammary Erythematous stuck-on, waxy papule  Assessment & Plan   SKIN CANCER SCREENING PERFORMED TODAY.  ACTINIC DAMAGE - Chronic condition, secondary to cumulative UV/sun exposure - diffuse scaly erythematous macules with underlying dyspigmentation - Recommend daily broad spectrum sunscreen SPF 30+ to sun-exposed areas, reapply every 2 hours as needed.  - Staying in the shade or wearing long sleeves, sun glasses (UVA+UVB protection) and wide brim hats (4-inch brim around the entire circumference of the hat) are also recommended for sun protection.  - Call for new or changing lesions.  LENTIGINES, SEBORRHEIC KERATOSES,  HEMANGIOMAS - Benign normal skin lesions - Benign-appearing - Call for any changes  SEBORRHEIC KERATOSIS - 1.0 cm x 0.8 cm light tan slightly waxy macule at left lateral eyebrow - Benign-appearing - Discussed benign etiology and prognosis. - Observe - Call for any changes  MELANOCYTIC NEVI - Tan-brown and/or pink-flesh-colored symmetric macules and papules - left posterior parietal, flesh papule - Benign appearing on exam today - Observation - Call clinic for new or changing moles - Recommend daily use of broad spectrum spf 30+ sunscreen to sun-exposed areas.   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 Currently well-controlled.   Rosacea is a chronic progressive skin condition usually affecting the face of adults, causing redness and/or acne bumps. It is treatable but not curable. It sometimes affects the eyes (ocular rosacea) as well. It may respond to topical and/or systemic medication and can flare with stress, sun exposure, alcohol, exercise, topical steroids (including hydrocortisone/cortisone 10) and some foods.  Daily application of broad spectrum spf 30+ sunscreen to face is recommended to reduce flares.  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 Condition is bothersome/symptomatic for patient. Currently flared.   Female Androgenic Alopecia is a chronic condition related  to genetics and/or hormonal changes.  In women androgenetic alopecia is commonly  associated with menopause but may occur any time after puberty.  It causes hair thinning primarily on the crown with widening of the part and temporal hairline recession.  Can use OTC Rogaine (minoxidil) 5% solution/foam as directed.  Oral treatments in female patients who have no contraindication may include : - Low dose oral minoxidil 1.25 - 5mg  daily - Spironolactone 50 - 100mg  bid - Finasteride 2.5 - 5 mg daily Adjunctive therapies include: - Low Level Laser Light Therapy (LLLT) - Platelet-rich plasma injections (PRP) - Hair Transplants or scalp reduction   Treatment Plan: Recommend minoxidil 5% (Rogaine for men) solution or foam to be applied to the scalp and left in. This should ideally be used twice daily for best results but it helps with hair regrowth when used at least three times per week. Rogaine initially can cause increased hair shedding for the first few weeks but this will stop with continued use. In studies, people who used minoxidil (Rogaine) for at least 6 months had thicker hair than people who did not. Minoxidil topical (Rogaine) only works as long as it continues to be used. If if it is no longer used then the hair it has been helping to regrow can fall out. Minoxidil topical (Rogaine) can cause increased facial hair growth which can usually be managed easily with a battery-operated hair trimmer. If facial hair growth is bothersome, switching to the 2% women's version can decrease the risk of unwanted facial hair growth.   Patient will schedule appt for alopecia if she wants to pursue oral treatment such as minoxidil and spironolactone.   INFLAMED SEBORRHEIC KERATOSIS Left Inframammary Reassured benign age-related growth.  Recommend observation.  Discussed cryotherapy if spot(s) become irritated or inflamed.  Call clinic for new or changing lesions.     Not bothersome for patient, treatment deferred.  ROSACEA   This Visit - metroNIDAZOLE  (METROCREAM ) 0.75 % cream -  Apply topically 2 (two) times daily. Return in about 4 weeks (around 06/06/2024) for Alopecia and 1 year TBSE, with Dr. Jackquline.  LILLETTE Lonell Drones, RMA, am acting as scribe for Rexene Jackquline, MD .   Documentation: I have reviewed the above documentation for accuracy and completeness, and I agree with the above.  Rexene Jackquline, MD   "

## 2024-05-09 NOTE — Patient Instructions (Signed)

## 2024-05-16 ENCOUNTER — Ambulatory Visit: Payer: Self-pay

## 2024-05-23 ENCOUNTER — Ambulatory Visit: Admitting: Family Medicine

## 2024-05-23 VITALS — BP 127/76 | HR 98 | Resp 14 | Ht 62.0 in | Wt 146.7 lb

## 2024-05-23 DIAGNOSIS — K5904 Chronic idiopathic constipation: Secondary | ICD-10-CM

## 2024-05-23 DIAGNOSIS — Z789 Other specified health status: Secondary | ICD-10-CM

## 2024-05-23 DIAGNOSIS — E1169 Type 2 diabetes mellitus with other specified complication: Secondary | ICD-10-CM

## 2024-05-23 DIAGNOSIS — E1159 Type 2 diabetes mellitus with other circulatory complications: Secondary | ICD-10-CM

## 2024-05-23 DIAGNOSIS — E663 Overweight: Secondary | ICD-10-CM

## 2024-05-23 NOTE — Assessment & Plan Note (Signed)
 On Linzess  per GI

## 2024-05-23 NOTE — Assessment & Plan Note (Signed)
 Currently well controlled without medication. Denies any symptoms. Last CMP was stable. - Continue lifestyle modifications

## 2024-05-23 NOTE — Assessment & Plan Note (Signed)
 Discussed importance of healthy weight management Discussed diet and exercise

## 2024-05-23 NOTE — Progress Notes (Signed)
 "     Established patient visit   Patient: Bethany Edwards   DOB: [birth year]   44 y.o. Female  MRN: [id number] Visit Date: 05/23/2024  Today's healthcare provider: Jon Eva, MD   Chief Complaint  Patient presents with   Medical Management of Chronic Issues    3 month follow up  Eye exam scheduled for this year   Subjective    HPI HPI     Medical Management of Chronic Issues    Additional comments: 3 month follow up  Eye exam scheduled for this year      Last edited by Wilfred Hargis RAMAN, CMA on 05/23/2024  3:36 PM.       Discussed the use of AI scribe software for clinical note transcription with the patient, who gave verbal consent to proceed.  History of Present Illness   Bethany Edwards is a 44 year old female with hypertension, hyperlipidemia, and type 2 diabetes mellitus who presents for chronic follow-up.  She takes Crestor  10 mg daily for hyperlipidemia and Ozempic  2 mg weekly for diabetes. Her blood pressure is managed with diet. Her last hemoglobin A1c was 6.5 and has improved compared with one year ago.  She had a colonoscopy on May 16, 2024 that found polyps and is awaiting pathology results.  She is unsure of her hepatitis B vaccination status and asks if she received it in childhood or adulthood.       Medications: Show/hide medication list[1]  Review of Systems     Objective    BP 127/76   Pulse 98   Resp 14   Ht 5' 2 (1.575 m)   Wt 146 lb 11.2 oz (66.5 kg)   SpO2 100%   BMI 26.83 kg/m    Physical Exam Vitals reviewed.  Constitutional:      General: She is not in acute distress.    Appearance: Normal appearance. She is well-developed. She is not diaphoretic.  HENT:     Head: Normocephalic and atraumatic.  Eyes:     General: No scleral icterus.    Conjunctiva/sclera: Conjunctivae normal.  Neck:     Thyroid: No thyromegaly.  Cardiovascular:     Rate and Rhythm: Normal rate and regular rhythm.     Heart sounds:  Normal heart sounds. No murmur heard. Pulmonary:     Effort: Pulmonary effort is normal. No respiratory distress.     Breath sounds: Normal breath sounds. No wheezing, rhonchi or rales.  Musculoskeletal:     Cervical back: Neck supple.     Right lower leg: No edema.     Left lower leg: No edema.  Lymphadenopathy:     Cervical: No cervical adenopathy.  Skin:    General: Skin is warm and dry.     Findings: No rash.  Neurological:     Mental Status: She is alert and oriented to person, place, and time. Mental status is at baseline.  Psychiatric:        Mood and Affect: Mood normal.        Behavior: Behavior normal.      No results found for any visits on 05/23/24.  Assessment & Plan     Problem List Items Addressed This Visit       Cardiovascular and Mediastinum   Hypertension associated with diabetes (HCC) - Primary   Currently well controlled without medication. Denies any symptoms. Last CMP was stable. - Continue lifestyle modifications       Relevant Orders  Comprehensive metabolic panel with GFR     Digestive   Chronic idiopathic constipation   On Linzess  per GI        Endocrine   Type 2 diabetes mellitus with hyperglycemia, without long-term current use of insulin (HCC)   Assoc with HTN and HLD Well-controlled with an A1c of 6.5% previously - will recheck  - Ordered A1c test - Ordered urine test for diabetes - Continue current medications: Crestor  10 mg daily, Ozempic  2 mg weekly      Hyperlipidemia associated with type 2 diabetes mellitus (HCC)   Reviewed last lipid panel Continue statin Repeat FLP and CMP Goal LDL < 70       Relevant Orders   Comprehensive metabolic panel with GFR   Lipid panel     Other   Overweight   Discussed importance of healthy weight management Discussed diet and exercise       Other Visit Diagnoses       Hepatitis B vaccination status unknown       Relevant Orders   Hepatitis B Surface AntiBODY            General Health Maintenance Routine health maintenance discussed, including vaccinations and screenings. Hepatitis B vaccination status is uncertain due to age at which it was introduced. Recent colonoscopy revealed polyps, awaiting pathology results. - Ordered cholesterol, kidney, and liver function tests - Checked Hepatitis B vaccination status through blood work - Scheduled mammogram for April - Await pathology results from recent colonoscopy       Return in about 6 months (around 11/20/2024).       Jon Eva, MD  [facility] [phone] (phone) [phone] (fax)  [facility]     [1]  Outpatient Medications Prior to Visit  Medication Sig   ibuprofen  (ADVIL ) 800 MG tablet TAKE 1 TABLET(800 MG) BY MOUTH EVERY 8 HOURS AS NEEDED FOR CRAMPING   LINZESS  290 MCG CAPS capsule TAKE 1 CAPSULE(290 MCG) BY MOUTH DAILY BEFORE AND BREAKFAST   metroNIDAZOLE  (METROCREAM ) 0.75 % cream Apply topically 2 (two) times daily.   nystatin -triamcinolone  (MYCOLOG II) cream Apply 1 application topically 2 (two) times daily.   ondansetron  (ZOFRAN ) 4 MG tablet Take 1 tablet (4 mg total) by mouth every 8 (eight) hours as needed for nausea or vomiting.   OZEMPIC , 2 MG/DOSE, 8 MG/3ML SOPN INJECT 2 MG ONCE A WEEK AS DIRECTED   rosuvastatin  (CRESTOR ) 10 MG tablet Take 1 tablet (10 mg total) by mouth daily.   traZODone  (DESYREL ) 50 MG tablet TAKE 1/2 TO 1 TABLET(25 TO 50 MG) BY MOUTH AT BEDTIME AS NEEDED FOR SLEEP   [DISCONTINUED] citalopram  (CELEXA ) 20 MG tablet TAKE 1 TABLET(20 MG) BY MOUTH DAILY   No facility-administered medications prior to visit.   "

## 2024-05-23 NOTE — Assessment & Plan Note (Addendum)
 Assoc with HTN and HLD Well-controlled with an A1c of 6.5% previously - will recheck  - Ordered A1c test - Ordered urine test for diabetes - Continue current medications: Crestor  10 mg daily, Ozempic  2 mg weekly

## 2024-05-23 NOTE — Assessment & Plan Note (Signed)
 Reviewed last lipid panel Continue statin Repeat FLP and CMP Goal LDL < 70

## 2024-05-24 LAB — COMPREHENSIVE METABOLIC PANEL WITH GFR
ALT: 13 [IU]/L (ref 0–32)
AST: 14 [IU]/L (ref 0–40)
Albumin: 4.4 g/dL (ref 3.9–4.9)
Alkaline Phosphatase: 61 [IU]/L (ref 41–116)
BUN/Creatinine Ratio: 11 (ref 9–23)
BUN: 8 mg/dL (ref 6–24)
Bilirubin Total: 0.3 mg/dL (ref 0.0–1.2)
CO2: 21 mmol/L (ref 20–29)
Calcium: 9.4 mg/dL (ref 8.7–10.2)
Chloride: 103 mmol/L (ref 96–106)
Creatinine, Ser: 0.72 mg/dL (ref 0.57–1.00)
Globulin, Total: 2.4 g/dL (ref 1.5–4.5)
Glucose: 156 mg/dL — ABNORMAL HIGH (ref 70–99)
Potassium: 4.9 mmol/L (ref 3.5–5.2)
Sodium: 141 mmol/L (ref 134–144)
Total Protein: 6.8 g/dL (ref 6.0–8.5)
eGFR: 104 mL/min/{1.73_m2}

## 2024-05-24 LAB — LIPID PANEL
Chol/HDL Ratio: 3.8 ratio (ref 0.0–4.4)
Cholesterol, Total: 129 mg/dL (ref 100–199)
HDL: 34 mg/dL — ABNORMAL LOW
LDL Chol Calc (NIH): 47 mg/dL (ref 0–99)
Triglycerides: 315 mg/dL — ABNORMAL HIGH (ref 0–149)
VLDL Cholesterol Cal: 48 mg/dL — ABNORMAL HIGH (ref 5–40)

## 2024-05-24 LAB — HEPATITIS B SURFACE ANTIBODY,QUALITATIVE: Hep B Surface Ab, Qual: NONREACTIVE

## 2024-05-24 LAB — HEMOGLOBIN A1C
Est. average glucose Bld gHb Est-mCnc: 148 mg/dL
Hgb A1c MFr Bld: 6.8 % — ABNORMAL HIGH (ref 4.8–5.6)

## 2024-05-24 LAB — MICROALBUMIN / CREATININE URINE RATIO
Creatinine, Urine: 87.4 mg/dL
Microalb/Creat Ratio: 9 mg/g{creat} (ref 0–29)
Microalbumin, Urine: 8 ug/mL

## 2024-06-06 ENCOUNTER — Ambulatory Visit

## 2024-11-21 ENCOUNTER — Encounter: Admitting: Family Medicine

## 2025-05-15 ENCOUNTER — Encounter
